# Patient Record
Sex: Female | Born: 1955
Health system: Southern US, Community
[De-identification: ages and names within clinical notes are randomized; demographics above are authoritative.]

## PROBLEM LIST (undated history)

## (undated) DIAGNOSIS — E039 Hypothyroidism, unspecified: Secondary | ICD-10-CM

## (undated) DIAGNOSIS — J45909 Unspecified asthma, uncomplicated: Secondary | ICD-10-CM

## (undated) DIAGNOSIS — K760 Fatty (change of) liver, not elsewhere classified: Secondary | ICD-10-CM

## (undated) DIAGNOSIS — K219 Gastro-esophageal reflux disease without esophagitis: Secondary | ICD-10-CM

## (undated) DIAGNOSIS — M722 Plantar fascial fibromatosis: Secondary | ICD-10-CM

## (undated) DIAGNOSIS — K649 Unspecified hemorrhoids: Secondary | ICD-10-CM

## (undated) DIAGNOSIS — R112 Nausea with vomiting, unspecified: Secondary | ICD-10-CM

## (undated) DIAGNOSIS — M797 Fibromyalgia: Secondary | ICD-10-CM

## (undated) DIAGNOSIS — R0683 Snoring: Secondary | ICD-10-CM

## (undated) DIAGNOSIS — I10 Essential (primary) hypertension: Secondary | ICD-10-CM

## (undated) DIAGNOSIS — G20A1 Parkinson's disease without dyskinesia, without mention of fluctuations: Secondary | ICD-10-CM

## (undated) DIAGNOSIS — M1990 Unspecified osteoarthritis, unspecified site: Secondary | ICD-10-CM

## (undated) DIAGNOSIS — Z794 Long term (current) use of insulin: Secondary | ICD-10-CM

## (undated) DIAGNOSIS — Z87898 Personal history of other specified conditions: Secondary | ICD-10-CM

## (undated) DIAGNOSIS — M4322 Fusion of spine, cervical region: Secondary | ICD-10-CM

## (undated) DIAGNOSIS — I2510 Atherosclerotic heart disease of native coronary artery without angina pectoris: Secondary | ICD-10-CM

## (undated) DIAGNOSIS — T466X5A Adverse effect of antihyperlipidemic and antiarteriosclerotic drugs, initial encounter: Secondary | ICD-10-CM

## (undated) DIAGNOSIS — M4306 Spondylolysis, lumbar region: Secondary | ICD-10-CM

## (undated) DIAGNOSIS — E669 Obesity, unspecified: Secondary | ICD-10-CM

## (undated) DIAGNOSIS — E119 Type 2 diabetes mellitus without complications: Secondary | ICD-10-CM

## (undated) DIAGNOSIS — R51 Headache: Secondary | ICD-10-CM

## (undated) DIAGNOSIS — I700 Atherosclerosis of aorta: Secondary | ICD-10-CM

## (undated) DIAGNOSIS — F32A Depression, unspecified: Secondary | ICD-10-CM

## (undated) DIAGNOSIS — Z8619 Personal history of other infectious and parasitic diseases: Secondary | ICD-10-CM

## (undated) DIAGNOSIS — R0600 Dyspnea, unspecified: Secondary | ICD-10-CM

## (undated) DIAGNOSIS — F40240 Claustrophobia: Secondary | ICD-10-CM

## (undated) DIAGNOSIS — R002 Palpitations: Secondary | ICD-10-CM

## (undated) DIAGNOSIS — E113599 Type 2 diabetes mellitus with proliferative diabetic retinopathy without macular edema, unspecified eye: Secondary | ICD-10-CM

## (undated) DIAGNOSIS — K259 Gastric ulcer, unspecified as acute or chronic, without hemorrhage or perforation: Secondary | ICD-10-CM

## (undated) DIAGNOSIS — I6789 Other cerebrovascular disease: Secondary | ICD-10-CM

## (undated) DIAGNOSIS — Z889 Allergy status to unspecified drugs, medicaments and biological substances status: Secondary | ICD-10-CM

## (undated) DIAGNOSIS — M4802 Spinal stenosis, cervical region: Secondary | ICD-10-CM

## (undated) DIAGNOSIS — M48061 Spinal stenosis, lumbar region without neurogenic claudication: Secondary | ICD-10-CM

## (undated) DIAGNOSIS — D649 Anemia, unspecified: Secondary | ICD-10-CM

## (undated) DIAGNOSIS — M546 Pain in thoracic spine: Secondary | ICD-10-CM

## (undated) DIAGNOSIS — G720 Drug-induced myopathy: Secondary | ICD-10-CM

## (undated) DIAGNOSIS — I499 Cardiac arrhythmia, unspecified: Secondary | ICD-10-CM

## (undated) DIAGNOSIS — Z8489 Family history of other specified conditions: Secondary | ICD-10-CM

## (undated) DIAGNOSIS — G250 Essential tremor: Secondary | ICD-10-CM

## (undated) DIAGNOSIS — Z9889 Other specified postprocedural states: Secondary | ICD-10-CM

## (undated) DIAGNOSIS — G43909 Migraine, unspecified, not intractable, without status migrainosus: Secondary | ICD-10-CM

## (undated) DIAGNOSIS — D72829 Elevated white blood cell count, unspecified: Secondary | ICD-10-CM

## (undated) DIAGNOSIS — M5412 Radiculopathy, cervical region: Secondary | ICD-10-CM

## (undated) DIAGNOSIS — F419 Anxiety disorder, unspecified: Secondary | ICD-10-CM

## (undated) DIAGNOSIS — T8859XA Other complications of anesthesia, initial encounter: Secondary | ICD-10-CM

## (undated) DIAGNOSIS — Z8709 Personal history of other diseases of the respiratory system: Secondary | ICD-10-CM

## (undated) DIAGNOSIS — R519 Headache, unspecified: Secondary | ICD-10-CM

## (undated) DIAGNOSIS — E785 Hyperlipidemia, unspecified: Secondary | ICD-10-CM

## (undated) DIAGNOSIS — F329 Major depressive disorder, single episode, unspecified: Secondary | ICD-10-CM

## (undated) HISTORY — PX: TONSILLECTOMY: SUR1361

## (undated) HISTORY — PX: CARPAL TUNNEL RELEASE: SHX101

## (undated) HISTORY — PX: SHOULDER ARTHROSCOPY: SHX128

## (undated) HISTORY — PX: LEG TENDON SURGERY: SHX1004

## (undated) HISTORY — PX: JOINT REPLACEMENT: SHX530

## (undated) HISTORY — PX: PLANTAR FASCIA SURGERY: SHX746

## (undated) HISTORY — PX: ALLOGRAFT APPLICATION: SHX6404

## (undated) HISTORY — PX: SHOULDER SURGERY: SHX246

## (undated) HISTORY — PX: TOTAL KNEE ARTHROPLASTY: SHX125

## (undated) HISTORY — DX: Essential (primary) hypertension: I10

## (undated) HISTORY — PX: NERVE SURGERY: SHX1016

## (undated) HISTORY — PX: BREAST BIOPSY: SHX20

## (undated) HISTORY — PX: OTHER SURGICAL HISTORY: SHX169

---

## 2004-09-07 ENCOUNTER — Emergency Department: Payer: Self-pay | Admitting: Emergency Medicine

## 2005-04-09 ENCOUNTER — Ambulatory Visit: Payer: Self-pay | Admitting: Unknown Physician Specialty

## 2005-04-16 ENCOUNTER — Ambulatory Visit: Payer: Self-pay | Admitting: Unknown Physician Specialty

## 2005-05-18 ENCOUNTER — Ambulatory Visit: Payer: Self-pay | Admitting: Obstetrics and Gynecology

## 2005-06-24 ENCOUNTER — Ambulatory Visit: Payer: Self-pay | Admitting: Unknown Physician Specialty

## 2005-07-12 ENCOUNTER — Ambulatory Visit: Payer: Self-pay

## 2005-07-29 ENCOUNTER — Ambulatory Visit: Payer: Self-pay

## 2006-08-16 ENCOUNTER — Ambulatory Visit: Payer: Self-pay | Admitting: Family Medicine

## 2006-08-17 ENCOUNTER — Ambulatory Visit: Payer: Self-pay | Admitting: Family Medicine

## 2006-10-27 ENCOUNTER — Ambulatory Visit: Payer: Self-pay | Admitting: Internal Medicine

## 2006-10-28 ENCOUNTER — Ambulatory Visit: Payer: Self-pay | Admitting: Internal Medicine

## 2006-11-27 ENCOUNTER — Ambulatory Visit: Payer: Self-pay | Admitting: Internal Medicine

## 2006-12-27 ENCOUNTER — Ambulatory Visit: Payer: Self-pay | Admitting: Internal Medicine

## 2007-01-20 ENCOUNTER — Ambulatory Visit: Payer: Self-pay | Admitting: Internal Medicine

## 2007-01-27 ENCOUNTER — Ambulatory Visit: Payer: Self-pay | Admitting: Internal Medicine

## 2007-02-01 ENCOUNTER — Ambulatory Visit: Payer: Self-pay | Admitting: Internal Medicine

## 2007-02-27 ENCOUNTER — Ambulatory Visit: Payer: Self-pay | Admitting: Internal Medicine

## 2007-08-22 ENCOUNTER — Ambulatory Visit: Payer: Self-pay | Admitting: Family Medicine

## 2007-11-28 ENCOUNTER — Ambulatory Visit: Payer: Self-pay

## 2007-12-27 ENCOUNTER — Ambulatory Visit: Payer: Self-pay | Admitting: Orthopaedic Surgery

## 2008-01-04 ENCOUNTER — Ambulatory Visit: Payer: Self-pay | Admitting: Orthopaedic Surgery

## 2008-06-28 HISTORY — PX: ACHILLES TENDON LENGTHENING: SUR826

## 2008-09-23 ENCOUNTER — Observation Stay: Payer: Self-pay | Admitting: Specialist

## 2008-09-26 ENCOUNTER — Ambulatory Visit: Payer: Self-pay | Admitting: Internal Medicine

## 2008-10-10 ENCOUNTER — Ambulatory Visit: Payer: Self-pay | Admitting: Family Medicine

## 2008-10-11 ENCOUNTER — Ambulatory Visit: Payer: Self-pay | Admitting: Internal Medicine

## 2008-10-26 ENCOUNTER — Ambulatory Visit: Payer: Self-pay | Admitting: Internal Medicine

## 2009-03-28 ENCOUNTER — Ambulatory Visit: Payer: Self-pay | Admitting: Internal Medicine

## 2009-04-23 ENCOUNTER — Ambulatory Visit: Payer: Self-pay | Admitting: Internal Medicine

## 2009-04-28 ENCOUNTER — Ambulatory Visit: Payer: Self-pay | Admitting: Internal Medicine

## 2009-09-26 ENCOUNTER — Ambulatory Visit: Payer: Self-pay | Admitting: Internal Medicine

## 2009-10-22 ENCOUNTER — Ambulatory Visit: Payer: Self-pay | Admitting: Internal Medicine

## 2009-10-26 ENCOUNTER — Ambulatory Visit: Payer: Self-pay | Admitting: Internal Medicine

## 2009-11-13 ENCOUNTER — Ambulatory Visit: Payer: Self-pay

## 2009-12-25 ENCOUNTER — Ambulatory Visit: Payer: Self-pay | Admitting: Otolaryngology

## 2010-01-09 ENCOUNTER — Ambulatory Visit: Payer: Self-pay | Admitting: Otolaryngology

## 2010-06-02 ENCOUNTER — Ambulatory Visit: Payer: Self-pay | Admitting: Podiatry

## 2010-08-24 ENCOUNTER — Ambulatory Visit: Payer: Self-pay | Admitting: Unknown Physician Specialty

## 2010-08-25 LAB — PATHOLOGY REPORT

## 2010-11-05 ENCOUNTER — Ambulatory Visit: Payer: Self-pay | Admitting: Podiatry

## 2011-01-25 ENCOUNTER — Ambulatory Visit: Payer: Self-pay | Admitting: Family Medicine

## 2011-09-30 ENCOUNTER — Ambulatory Visit: Payer: Self-pay | Admitting: Unknown Physician Specialty

## 2011-10-12 ENCOUNTER — Ambulatory Visit: Payer: Self-pay | Admitting: Anesthesiology

## 2011-10-12 LAB — BASIC METABOLIC PANEL
BUN: 20 mg/dL — ABNORMAL HIGH (ref 7–18)
Calcium, Total: 8.9 mg/dL (ref 8.5–10.1)
Co2: 28 mmol/L (ref 21–32)
EGFR (African American): >60
EGFR (Non-African Amer.): >60
Glucose: 109 mg/dL — ABNORMAL HIGH (ref 65–99)
Potassium: 3.8 mmol/L (ref 3.5–5.1)
Sodium: 137 mmol/L (ref 136–145)

## 2011-10-14 ENCOUNTER — Ambulatory Visit: Payer: Self-pay | Admitting: Orthopedic Surgery

## 2012-04-04 ENCOUNTER — Ambulatory Visit: Payer: Self-pay | Admitting: Internal Medicine

## 2012-05-09 ENCOUNTER — Ambulatory Visit: Payer: Self-pay | Admitting: Specialist

## 2012-06-06 ENCOUNTER — Ambulatory Visit: Payer: Self-pay | Admitting: Specialist

## 2012-08-14 ENCOUNTER — Ambulatory Visit: Payer: Self-pay | Admitting: Physical Medicine and Rehabilitation

## 2012-10-04 DIAGNOSIS — M542 Cervicalgia: Secondary | ICD-10-CM | POA: Insufficient documentation

## 2012-10-05 ENCOUNTER — Ambulatory Visit: Payer: Self-pay | Admitting: Family Medicine

## 2012-10-10 ENCOUNTER — Ambulatory Visit: Payer: Self-pay | Admitting: Internal Medicine

## 2012-10-10 LAB — CBC CANCER CENTER
Basophil #: 0.1 x10 3/mm (ref 0.0–0.1)
Comment - H1-Com3: NORMAL
HGB: 12 g/dL (ref 12.0–16.0)
Lymphocyte #: 5.4 x10 3/mm — ABNORMAL HIGH (ref 1.0–3.6)
Lymphocyte %: 34.9 %
MCH: 25.2 pg — ABNORMAL LOW (ref 26.0–34.0)
MCHC: 32.2 g/dL (ref 32.0–36.0)
Monocyte #: 1.2 x10 3/mm — ABNORMAL HIGH (ref 0.2–0.9)
Monocytes: 7 %
NRBC/100 WBC: 1 /100
Neutrophil %: 54.6 %
Other Cells Blood: 3 %
Platelet: 257 x10 3/mm (ref 150–440)
RBC: 4.78 10*6/uL (ref 3.80–5.20)
RDW: 16.5 % — ABNORMAL HIGH (ref 11.5–14.5)
Segmented Neutrophils: 54 %
Variant Lymphocyte: 4 %
WBC: 15.6 x10 3/mm — ABNORMAL HIGH (ref 3.6–11.0)

## 2012-10-10 LAB — CREATININE, SERUM
Creatinine: 0.74 mg/dL (ref 0.60–1.30)
EGFR (African American): >60

## 2012-10-10 LAB — HEPATIC FUNCTION PANEL A (ARMC)
Albumin: 3.6 g/dL (ref 3.4–5.0)
Bilirubin,Total: 0.3 mg/dL (ref 0.2–1.0)
SGPT (ALT): 32 U/L (ref 12–78)

## 2012-10-11 LAB — PROT IMMUNOELECTROPHORES(ARMC)

## 2012-10-26 ENCOUNTER — Ambulatory Visit: Payer: Self-pay | Admitting: Internal Medicine

## 2012-10-31 LAB — IRON AND TIBC
Iron Bind.Cap.(Total): 358 ug/dL (ref 250–450)
Iron Saturation: 16 %
Iron: 58 ug/dL (ref 50–170)

## 2012-11-14 ENCOUNTER — Ambulatory Visit: Payer: Self-pay | Admitting: Family Medicine

## 2012-11-21 DIAGNOSIS — M4802 Spinal stenosis, cervical region: Secondary | ICD-10-CM | POA: Insufficient documentation

## 2012-11-26 ENCOUNTER — Ambulatory Visit: Payer: Self-pay | Admitting: Internal Medicine

## 2012-11-29 HISTORY — PX: OTHER SURGICAL HISTORY: SHX169

## 2012-12-04 HISTORY — PX: OTHER SURGICAL HISTORY: SHX169

## 2013-01-30 ENCOUNTER — Ambulatory Visit: Payer: Self-pay | Admitting: Gastroenterology

## 2013-01-31 LAB — PATHOLOGY REPORT

## 2013-06-06 ENCOUNTER — Ambulatory Visit: Payer: Self-pay | Admitting: Orthopedic Surgery

## 2013-10-31 ENCOUNTER — Ambulatory Visit: Payer: Self-pay | Admitting: Orthopedic Surgery

## 2013-11-14 ENCOUNTER — Ambulatory Visit: Payer: Self-pay | Admitting: Orthopedic Surgery

## 2013-11-14 DIAGNOSIS — I10 Essential (primary) hypertension: Secondary | ICD-10-CM

## 2013-11-14 LAB — URINALYSIS, COMPLETE
BACTERIA: NONE SEEN
Bilirubin,UR: NEGATIVE
Blood: NEGATIVE
Glucose,UR: NEGATIVE mg/dL (ref 0–75)
Hyaline Cast: 12
Ketone: NEGATIVE
Leukocyte Esterase: NEGATIVE
Nitrite: NEGATIVE
PH: 5 (ref 4.5–8.0)
Protein: 30
Specific Gravity: 1.023 (ref 1.003–1.030)
Squamous Epithelial: 4
WBC UR: 6 /HPF (ref 0–5)

## 2013-11-14 LAB — MRSA PCR SCREENING

## 2013-11-14 LAB — CBC
HCT: 42.1 % (ref 35.0–47.0)
HGB: 13.6 g/dL (ref 12.0–16.0)
MCH: 27 pg (ref 26.0–34.0)
MCHC: 32.3 g/dL (ref 32.0–36.0)
MCV: 83 fL (ref 80–100)
Platelet: 303 10*3/uL (ref 150–440)
RBC: 5.05 10*6/uL (ref 3.80–5.20)
RDW: 14.8 % — ABNORMAL HIGH (ref 11.5–14.5)
WBC: 14.1 10*3/uL — AB (ref 3.6–11.0)

## 2013-11-14 LAB — BASIC METABOLIC PANEL
ANION GAP: 9 (ref 7–16)
BUN: 17 mg/dL (ref 7–18)
CALCIUM: 9.2 mg/dL (ref 8.5–10.1)
CHLORIDE: 97 mmol/L — AB (ref 98–107)
Co2: 30 mmol/L (ref 21–32)
Creatinine: 0.96 mg/dL (ref 0.60–1.30)
EGFR (African American): >60
Glucose: 218 mg/dL — ABNORMAL HIGH (ref 65–99)
Osmolality: 280 (ref 275–301)
Potassium: 3.4 mmol/L — ABNORMAL LOW (ref 3.5–5.1)
Sodium: 136 mmol/L (ref 136–145)

## 2013-11-14 LAB — SEDIMENTATION RATE: Erythrocyte Sed Rate: 38 mm/hr — ABNORMAL HIGH (ref 0–30)

## 2013-11-14 LAB — PROTIME-INR
INR: 1
PROTHROMBIN TIME: 12.7 s (ref 11.5–14.7)

## 2013-11-14 LAB — APTT: Activated PTT: 26.1 secs (ref 23.6–35.9)

## 2013-11-15 LAB — URINE CULTURE

## 2013-11-27 ENCOUNTER — Inpatient Hospital Stay: Payer: Self-pay | Admitting: Orthopedic Surgery

## 2013-11-27 HISTORY — PX: TOTAL KNEE ARTHROPLASTY: SHX125

## 2013-11-28 LAB — HEMOGLOBIN: HGB: 11.8 g/dL — ABNORMAL LOW (ref 12.0–16.0)

## 2013-11-28 LAB — BASIC METABOLIC PANEL
Anion Gap: 7 (ref 7–16)
BUN: 12 mg/dL (ref 7–18)
CO2: 30 mmol/L (ref 21–32)
CREATININE: 0.7 mg/dL (ref 0.60–1.30)
Calcium, Total: 7.7 mg/dL — ABNORMAL LOW (ref 8.5–10.1)
Chloride: 98 mmol/L (ref 98–107)
GLUCOSE: 166 mg/dL — AB (ref 65–99)
OSMOLALITY: 274 (ref 275–301)
Potassium: 3.6 mmol/L (ref 3.5–5.1)
Sodium: 135 mmol/L — ABNORMAL LOW (ref 136–145)

## 2013-11-28 LAB — PLATELET COUNT: PLATELETS: 257 10*3/uL (ref 150–440)

## 2013-11-29 LAB — PATHOLOGY REPORT

## 2013-12-06 DIAGNOSIS — Z96659 Presence of unspecified artificial knee joint: Secondary | ICD-10-CM | POA: Insufficient documentation

## 2014-05-07 DIAGNOSIS — M546 Pain in thoracic spine: Secondary | ICD-10-CM | POA: Insufficient documentation

## 2014-10-19 NOTE — Discharge Summary (Signed)
PATIENT NAMEKINZLEY, [name] MR#:  [id number] DATE OF BIRTH:  09-Dec-1955  DATE OF ADMISSION:  11/27/2013 DATE OF DISCHARGE: 11/30/2013   ADMITTING DIAGNOSIS: Right knee osteoarthritis.   DISCHARGE DIAGNOSIS: Right knee osteoarthritis.   PROCEDURE: Right total knee replacement.   ANESTHESIA: Spinal.   SURGEON: Laurene Footman, M.D.   ASSISTANT: Rachelle Hora, PA-C   ANESTHESIA: Spinal.   ESTIMATED BLOOD LOSS: 100 mL.  TOURNIQUET TIME: 69 minutes at 300 mmHg.  IMPLANTS: Medacta GMK total knee system, GMK sphere system with a right 3 sphere femur, right 2 fixed tibia insert tray, and a size 2 right 12 mm Flex insert, size 2 patella. All components cemented.   CONDITION: To recovery room, stable.   HISTORY: The patient is a 59 year old female, who has had years of right knee pain. She has failed nonoperative treatment options such as anti-inflammatory medications, pain medications, cortisone injections and Synvisc injections. The pain has progressed to the point where it interferes with her activities of daily living. Pain is severe mostly along the medial joint line. She describes it as a dull ache. She has pain with rest as well as with activity. The patient has agreed and consented to a right total knee replacement.   PHYSICAL EXAMINATION:  GENERAL: Well developed, obese female in no apparent distress. Normal affect. Presents with a cane.  HEART: Regular rate and rhythm.  LUNGS: Clear to auscultation bilaterally. No wheezes, rales, or rhonchi.  HEENT: Head normocephalic, atraumatic. Pupils equal, round, and reactive to light.  EXTREMITIES: Right knee: Examination of the right knee shows that the patient has mild swelling. No warmth, erythema or effusion. She has range of motion of 0 to 120 degrees. There is no laxity with valgus and varus stress testing. She is tender along the medial joint line and nontender along lateral joint line. She has 2+ dorsalis pedis pulse with 5 out of 5  strength with ankle plantar flexion, dorsiflexion, knee flexion and extension.   HOSPITAL COURSE: The patient was admitted to the hospital on November 27, 2013. She had surgery that same day and was brought to the orthopedic floor from the PACU in stable condition. On postop day 1, the patient had a small drop in her hemoglobin with an acute postop blood loss anemia with a hemoglobin of 11.8. Her vital signs were stable. She was having moderate pain. She had good progress with physical therapy and was able to ambulate 200 feet. On postop day 2, the patient's pain was moderate to severe. We did try to get her off of IV morphine as well as Dilaudid. We continued with oxycodone 5 mg 1 tab p.o. q.4 to 6 hours as needed for pain. We did add OxyContin extended release 10 mg 1 tab p.o. b.i.d. Adjustment in her pain medication did seem to give her adequate relief. Her pain was controlled throughout the day at night. She did very well with physical therapy on postop day 2. The patient was stable and ready for discharge to home health PT. On postop day 3, the patient met all goals for discharge to home with home health PT. She was doing well. Pain medications were continued. She does not have any nausea or vomiting. She was tolerating p.o. and she was ready for discharge.   DISCHARGE INSTRUCTIONS: The patient may gradually increase weight-bearing on the affected extremity. Elevate the affected foot or leg on 1 or 2 pillows with the foot higher than the knee. Thigh-high TED hose on both  legs and remove at bedtime, replace on arising the next morning. Elevate the heels off the bed. Use incentive spirometer every hour while awake and encourage cough and deep breathing. No concentrated sweets or sugar. Diabetic diet as tolerated. Continue using Polar Care unit maintaining temp between 40 and 50 degrees. Do not get the dressing or bandage wet or dirty. Call [facility] ortho if the dressing gets water under it. Leave the dressing on. Call  [facility] ortho if any of the following occur: Bright red bleeding from the incision wound, fever above 101.5 degrees, redness, swelling, or drainage at the incision site. Call [facility] ortho if you experience any increased leg pain, numbness or weakness in legs or bowel or bladder symptoms. Home physical therapist arranged for continuation of rehab program. Please call [place] ortho if therapist has not contacted you within 48 hours of your return home. Call [facility] ortho for a followup appointment. The patient needs be seen in 2 weeks with [facility] ortho, please call for an appointment. Discharging physician is Dr. Hessie Knows.   DISCHARGE MEDICATIONS: Please see discharge instructions for discharge medications.  ____________________________ T. Rachelle Hora, PA-C tcg:aw D: 11/30/2013 08:20:19 ET T: 11/30/2013 08:45:37 ET JOB#: [id number]  cc: T. Rachelle Hora, PA-C, <Dictator> Duanne Guess [place] ELECTRONICALLY SIGNED 12/22/2013 0:07

## 2014-10-19 NOTE — Op Note (Signed)
PATIENT NAMEEMMAGENE, [name] MR#:  [id number] DATE OF BIRTH:  [birth year]  DATE OF PROCEDURE:  11/27/2013  PREOPERATIVE DIAGNOSIS: Right knee osteoarthritis.   POSTOPERATIVE DIAGNOSIS: Right knee osteoarthritis.   PROCEDURE: Right total knee replacement.   ANESTHESIA: Spinal.   SURGEON: Hessie Knows, M.D.   ASSISTANT: Rachelle Hora, PA-C.   DESCRIPTION OF PROCEDURE: The patient was brought to the Operating Room and, after adequate anesthesia was obtained, the right leg was prepped and draped in the usual sterile fashion with a tourniquet applied to the upper thigh. After patient identification and timeout procedures were completed, the leg was exsanguinated with an Esmarch and the tourniquet raised to 300 mmHg. A midline skin incision was made, followed by a medial parapatellar arthrotomy. Inspection revealed moderate to advanced medial and patellofemoral arthritis, mild lateral compartment arthritis, anterior cruciate ligament intact, along with meniscus. After excising the fat pad and the anterior cruciate ligament, the proximal tibia was exposed to allow for application of the proximal tibia cutting guide. This cutting guide was applied, alignment checked and proximal tibia cut carried out, with the bone removed, along with the posterior horns of the menisci.   Next, the femur was approached in a similar fashion, removing some cartilage off the end of the femur laterally, applying the cutting block, cutting the distal femur, applying the distal femoral cutting guide, size 3, anterior, posterior and chamfer cuts made. A size 2 tibial template was applied, with rotation based on one of the initial pins from the tibial guide. This was pinned in place, proximal drilling carried out. A keel punch placed and trials then placed, with a 3 femur, 2 tibia millimeters. A 12 mm gave good stability.   At this point, the trials were removed and the trochlear cut made on the distal femur after drill holes has been  made for the distal femur. Patella was cut with freehand technique. After showing significant patellofemoral degenerative change, with some exposed bone. A 10 mm resection carried out and tibia sized to a size 2. The tourniquet was let down at this point. Hemostasis was checked with electrocautery. Injection of morphine and Sensorcaine, along with a dilute Exparel periarticularly was carried out. The wound was thoroughly irrigated and the tourniquet was raised. The bony surfaces were dried. The tibial component was cemented into place first, followed by the femoral component, 12 mm trial. Knee held in extension. The patellar button, size 2, clamped into place while the components cemented.   After the cement was set, the knee was thoroughly irrigated. The 12 mm insert appeared to give excellent stability, full extension, and was chosen as the final implant. The trial was removed and the knee again thoroughly irrigated. The final component was snapped into place with a set screw. There was excellent patellofemoral tracking without any need for lateral release. The medial arthrotomy was repaired using a heavy quill, 2-0 quill subcutaneously and skin staples. Xeroform, 4 x 4's, ABDs, Webril, Polar Care and Ace wrap applied. The patient was sent to the recovery room in stable condition.   ESTIMATED BLOOD LOSS: 100.   TOURNIQUET TIME: 69 minutes at 300 mmHg.   IMPLANTS: Medacta GMK total knee system, GMK Sphere system with a right 3 Sphere femur, right 2 fixed tibia insert tray and a size 2 right 12 mm flex insert, size 2 patella. All components cemented.   CONDITION: To recovery room, stable.   COMPLICATIONS: None.     ____________________________ Laurene Footman, MD mjm:cg D: 11/28/2013  00:49:02 ET T: 11/28/2013 01:30:53 ET JOB#: [id number]  cc: Laurene Footman, MD, <Dictator> Laurene Footman MD ELECTRONICALLY SIGNED 11/29/2013 7:24

## 2014-10-20 NOTE — Op Note (Signed)
PATIENT NAMEARLONE, [name] MR#:  [id number] DATE OF BIRTH:  18-Mar-1956  DATE OF PROCEDURE:  10/14/2011  PREOPERATIVE DIAGNOSIS: Right knee lateral meniscus tear and osteoarthritis.   POSTOPERATIVE DIAGNOSIS: Medial meniscus tear and osteoarthritis.   PROCEDURE: Arthroscopy, partial medial meniscectomy.   SURGEON: Laurene Footman, MD  ANESTHESIA: General.    DESCRIPTION OF PROCEDURE: Patient brought to the Operating Room and after adequate anesthesia was obtained, the right leg was placed in the arthroscopic legholder and tourniquet applied. After patient identification and timeout procedures were completed, an inferolateral portal was made and the arthroscope was introduced. Initial inspection revealed some synovial folds in suprapatellar pouch. No loose bodies. There is significant patellofemoral degenerative change. Coming around medially, an inferomedial portal was made and on probing there was extensive tears to the posterior horn of the medial meniscus with vertical and horizontal components and some of these could be displaced into the joint. There was extensive erosive changes to both femoral and tibial condyles with no normal articular cartilage. The anterior cruciate ligament was intact. The lateral compartment had degenerative changes but intact meniscus. The gutters were checked and there were no loose bodies. At this point, the medial meniscus was approached with the use of meniscal punch followed by ArthroCare wand and using a grasper to remove large meniscal fragments so that they would not be left. After thorough irrigation of the joint and removal of the meniscus back to a stable margin all instrumentation was withdrawn. The wounds were closed with simple interrupted 4-0 nylon. 20 mL of 0.5% Sensorcaine with epinephrine was infiltrated into the area of the portals. Xeroform, 4 x 4, Webril, and Ace wrap applied and the patient sent to recovery in stable condition.   ESTIMATED BLOOD LOSS:  Minimal.   COMPLICATIONS: None.     SPECIMEN: None.   ____________________________ Laurene Footman, MD mjm:cms D: 10/14/2011 20:48:07 ET T: 10/15/2011 08:29:38 ET JOB#: [id number]  cc: Laurene Footman, MD, <Dictator> Laurene Footman MD ELECTRONICALLY SIGNED 10/15/2011 12:20

## 2015-08-19 ENCOUNTER — Other Ambulatory Visit: Payer: Self-pay | Admitting: Family Medicine

## 2015-08-19 DIAGNOSIS — Z1231 Encounter for screening mammogram for malignant neoplasm of breast: Secondary | ICD-10-CM

## 2015-08-22 ENCOUNTER — Institutional Professional Consult (permissible substitution): Payer: Self-pay | Admitting: Internal Medicine

## 2015-09-02 ENCOUNTER — Ambulatory Visit
Admission: RE | Admit: 2015-09-02 | Discharge: 2015-09-02 | Disposition: A | Discharge status: Home / Self Care | Payer: BLUE CROSS/BLUE SHIELD | Source: Ambulatory Visit | Attending: Family Medicine | Admitting: Family Medicine

## 2015-09-02 DIAGNOSIS — Z1231 Encounter for screening mammogram for malignant neoplasm of breast: Secondary | ICD-10-CM | POA: Diagnosis not present

## 2015-09-30 ENCOUNTER — Encounter: Payer: Self-pay | Admitting: Pulmonary Disease

## 2015-09-30 ENCOUNTER — Ambulatory Visit (INDEPENDENT_AMBULATORY_CARE_PROVIDER_SITE_OTHER): Payer: BLUE CROSS/BLUE SHIELD | Admitting: Pulmonary Disease

## 2015-09-30 ENCOUNTER — Encounter (INDEPENDENT_AMBULATORY_CARE_PROVIDER_SITE_OTHER): Payer: Self-pay

## 2015-09-30 VITALS — BP 130/74 | HR 69 | Ht 64.0 in | Wt 236.6 lb

## 2015-09-30 DIAGNOSIS — R079 Chest pain, unspecified: Secondary | ICD-10-CM | POA: Diagnosis not present

## 2015-09-30 DIAGNOSIS — R0609 Other forms of dyspnea: Secondary | ICD-10-CM

## 2015-09-30 DIAGNOSIS — E669 Obesity, unspecified: Secondary | ICD-10-CM

## 2015-09-30 DIAGNOSIS — Z87891 Personal history of nicotine dependence: Secondary | ICD-10-CM

## 2015-09-30 MED ORDER — UMECLIDINIUM-VILANTEROL 62.5-25 MCG/INH IN AEPB: 1.0000 | INHALATION_SPRAY | Freq: Every day | RESPIRATORY_TRACT | Status: AC

## 2015-09-30 MED ORDER — UMECLIDINIUM-VILANTEROL 62.5-25 MCG/INH IN AEPB
1.0000 | INHALATION_SPRAY | Freq: Every day | RESPIRATORY_TRACT | Status: DC
Start: 2015-09-30 — End: 2015-10-30

## 2015-09-30 MED ORDER — UMECLIDINIUM-VILANTEROL 62.5-25 MCG/INH IN AEPB: 1.0000 | INHALATION_SPRAY | Freq: Every day | RESPIRATORY_TRACT | Status: DC

## 2015-09-30 NOTE — Patient Instructions (Signed)
Trial of Anoro inhaler - one inhalation daily Continue albuterol (Ventolin) as previously Follow up in 4-6 weeks with lung function tests and chest Xray

## 2015-10-02 NOTE — Progress Notes (Signed)
PULMONARY CONSULT NOTE  Requesting MD/Service: [place] Pink, MD Date of initial consultation: )4/)4/17 Reason for consultation:  Dyspnea  PT PROFILE: 60 y.o. F former smoker of approx 30 p-y (quit 2002) referred for evaluation of exertional dyspnea. 09/30/15: Initial eval. Trial of Anoro inhaler. CXR and PFTs ordered.  HPI:  19 F former smoker referred for evaluation of exertional dyspnea of several years' duration with constant chest pain which she characterizes as midsternal chest tightness. Her CP is positional and not related to exertion. She has undergone cardiac evaluation for these without a cardiac etiology rendered. She has been prescribed a diuretic with 9# wt loss and improvement in LE edema but no significant change in dyspnea. There is little day to day variation and little season to season variation in her symptoms. She has occasional NP cough. She denies CP, fever, purulent sputum, LE edema and calf tenderness. She has been prescribed an albuterol inhaler which she states "helps a lot". She has seen Dr Raul Del approx 5 yrs ago but does not wish to follow with him again.   Past Medical History  Diagnosis Date  . Hypertension         Allergies - improved after immunotherapy  Past Surgical History  Procedure Laterality Date  . Breast biopsy Bilateral 1970's  . Breast biopsy Left     stereotactic  . Cesarean section    . Total knee arthroplasty    . Shoulder surgery      MEDICATIONS: I have reviewed all medications and confirmed regimen as documented  Social History   Social History  . Marital Status: Married    Spouse Name: N/A  . Number of Children: N/A  . Years of Education: N/A   Occupational History  . Not on file.   Social History Main Topics  . Smoking status: Former Smoker -- 2.00 packs/day for 35 years    Quit date: 07/01/2000  . Smokeless tobacco: Never Used  . Alcohol Use: No  . Drug Use: No  . Sexual Activity: Not on file   Other Topics Concern   . Not on file   Social History Narrative    Family History  Problem Relation Age of Onset  . Breast cancer Mother 71    ROS: No fever, myalgias/arthralgias, unexplained weight loss or weight gain No new focal weakness or sensory deficits No otalgia, hearing loss, visual changes, nasal and sinus symptoms, mouth and throat problems No neck pain or adenopathy No abdominal pain, N/V/D, diarrhea, change in bowel pattern No dysuria, change in urinary pattern No LE edema or calf tenderness   Filed Vitals:   09/30/15 0907  BP: 130/74  Pulse: 69  Height: 5\' 4"  (1.626 m)  Weight: 236 lb 9.6 oz (107.321 kg)  SpO2: 95%     EXAM:  Gen: Obese, tremulous, no overt respiratory distress HEENT: NCAT, TMs and canals normal, sclera white, nares and nasal mucosa normal, oropharynx normal Neck: Supple without LAN, thyromegaly, JVD Lungs: breath sounds mildly diminished, percussion note normal throughout, No adventitious sounds Cardiovascular: Normal rate, reg rhythm, no murmurs noted Abdomen: Soft, nontender, normal BS Ext: without clubbing, cyanosis. Trace ankle edema Neuro: CNs grossly intact, motor and sensory intact, DTRs symmetric Skin: Limited exam, no lesions noted  DATA:   BMP Latest Ref Rng 11/28/2013 11/14/2013 10/10/2012  Glucose 65-99 mg/dL 166(H) 218(H) -  BUN 7-18 mg/dL 12 17 -  Creatinine 0.60-1.30 mg/dL 0.70 0.96 0.74  Sodium 136-145 mmol/L 135(L) 136 -  Potassium 3.5-5.1 mmol/L 3.6  3.4(L) -  Chloride 98-107 mmol/L 98 97(L) -  CO2 21-32 mmol/L 30 30 -  Calcium 8.5-10.1 mg/dL 7.7(L) 9.2 -    CBC Latest Ref Rng 11/28/2013 11/14/2013 10/10/2012  WBC 3.6-11.0 x10 3/mm 3 - 14.1(H) 15.6(H)  Hemoglobin 12.0-16.0 g/dL 11.8(L) 13.6 12.0  Hematocrit 35.0-47.0 % - 42.1 37.4  Platelets 150-440 x10 3/mm 3 257 303 257    CXR:  No recent films  IMPRESSION:     ICD-9-CM ICD-10-CM   1. DOE (dyspnea on exertion) 786.09 R06.09 Pulmonary function test     DG Chest 2 View  2. Chest  pain, unspecified chest pain type 786.50 R07.9 Pulmonary function test     DG Chest 2 View  3. Obesity 278.00 E66.9   4. Former smoker V15.82 Z87.891    Suspect COPD based on smoking history with reversible component based on response to albuterol MDI  PLAN:  Trial Anoro inhaler Cont PRN albuterol ROV 4-6 wks with PFTs, CXR   Merton Border, MD PCCM service Mobile [phone] Pager [phone] 10/02/2015

## 2015-10-29 ENCOUNTER — Ambulatory Visit (INDEPENDENT_AMBULATORY_CARE_PROVIDER_SITE_OTHER): Payer: BLUE CROSS/BLUE SHIELD | Admitting: *Deleted

## 2015-10-29 ENCOUNTER — Ambulatory Visit
Admission: RE | Admit: 2015-10-29 | Discharge: 2015-10-29 | Disposition: A | Discharge status: Home / Self Care | Payer: BLUE CROSS/BLUE SHIELD | Source: Ambulatory Visit | Attending: Pulmonary Disease | Admitting: Pulmonary Disease

## 2015-10-29 DIAGNOSIS — I517 Cardiomegaly: Secondary | ICD-10-CM | POA: Diagnosis not present

## 2015-10-29 DIAGNOSIS — R079 Chest pain, unspecified: Secondary | ICD-10-CM

## 2015-10-29 DIAGNOSIS — R0609 Other forms of dyspnea: Secondary | ICD-10-CM

## 2015-10-29 LAB — PULMONARY FUNCTION TEST
DL/VA % PRED: 89 %
DL/VA: 4.31 ml/min/mmHg/L
DLCO UNC % PRED: 176 %
DLCO unc: 42.93 ml/min/mmHg
FEF 25-75 PRE: 2.53 L/s
FEF 25-75 Post: 2.77 L/sec
FEF2575-%CHANGE-POST: 9 %
FEF2575-%PRED-POST: 121 %
FEF2575-%Pred-Pre: 111 %
FEV1-%CHANGE-POST: 2 %
FEV1-%Pred-Post: 66 %
FEV1-%Pred-Pre: 65 %
FEV1-POST: 1.68 L
FEV1-Pre: 1.64 L
FEV1FVC-%Change-Post: 1 %
FEV1FVC-%PRED-PRE: 113 %
FEV6-%Change-Post: 0 %
FEV6-%PRED-POST: 59 %
FEV6-%Pred-Pre: 58 %
FEV6-Post: 1.85 L
FEV6-Pre: 1.85 L
FEV6FVC-%Pred-Post: 104 %
FEV6FVC-%Pred-Pre: 104 %
FVC-%CHANGE-POST: 0 %
FVC-%PRED-POST: 56 %
FVC-%Pred-Pre: 56 %
FVC-PRE: 1.85 L
FVC-Post: 1.85 L
POST FEV1/FVC RATIO: 91 %
PRE FEV6/FVC RATIO: 100 %
Post FEV6/FVC ratio: 100 %
Pre FEV1/FVC ratio: 89 %
RV % PRED: 80 %
RV: 1.63 L
TLC % pred: 71 %
TLC: 3.62 L

## 2015-10-29 NOTE — Progress Notes (Signed)
PFT performed.

## 2015-10-30 ENCOUNTER — Ambulatory Visit (INDEPENDENT_AMBULATORY_CARE_PROVIDER_SITE_OTHER): Payer: BLUE CROSS/BLUE SHIELD | Admitting: Pulmonary Disease

## 2015-10-30 ENCOUNTER — Other Ambulatory Visit
Admission: RE | Admit: 2015-10-30 | Discharge: 2015-10-30 | Disposition: A | Discharge status: Home / Self Care | Payer: BLUE CROSS/BLUE SHIELD | Source: Ambulatory Visit | Attending: Pulmonary Disease | Admitting: Pulmonary Disease

## 2015-10-30 ENCOUNTER — Encounter: Payer: Self-pay | Admitting: Pulmonary Disease

## 2015-10-30 VITALS — BP 132/76 | HR 75 | Ht 64.0 in | Wt 238.0 lb

## 2015-10-30 DIAGNOSIS — R0600 Dyspnea, unspecified: Secondary | ICD-10-CM | POA: Diagnosis not present

## 2015-10-30 DIAGNOSIS — Z87891 Personal history of nicotine dependence: Secondary | ICD-10-CM

## 2015-10-30 DIAGNOSIS — E669 Obesity, unspecified: Secondary | ICD-10-CM

## 2015-10-30 DIAGNOSIS — R942 Abnormal results of pulmonary function studies: Secondary | ICD-10-CM

## 2015-10-30 DIAGNOSIS — K219 Gastro-esophageal reflux disease without esophagitis: Secondary | ICD-10-CM

## 2015-10-30 LAB — CBC
HEMATOCRIT: 38.8 % (ref 35.0–47.0)
Hemoglobin: 12.5 g/dL (ref 12.0–16.0)
MCH: 25.3 pg — ABNORMAL LOW (ref 26.0–34.0)
MCHC: 32.2 g/dL (ref 32.0–36.0)
MCV: 78.5 fL — AB (ref 80.0–100.0)
Platelets: 307 10*3/uL (ref 150–440)
RBC: 4.95 MIL/uL (ref 3.80–5.20)
RDW: 16 % — ABNORMAL HIGH (ref 11.5–14.5)
WBC: 13.4 10*3/uL — AB (ref 3.6–11.0)

## 2015-10-30 LAB — BRAIN NATRIURETIC PEPTIDE: B Natriuretic Peptide: 29 pg/mL (ref 0.0–100.0)

## 2015-11-01 NOTE — Progress Notes (Signed)
PULMONARY OFFICE FOLLOW UP NOTE  Requesting MD/Service: Maryland Pink, MD Date of initial consultation: )4/)4/17 Reason for consultation:  Dyspnea  PT PROFILE: 60 y.o. F former smoker of approx 30 p-y (quit 2002) referred for evaluation of exertional dyspnea. 09/30/15: Initial eval. Trial of Anoro inhaler. CXR and PFTs ordered. 10/30/15 Routine ROV: Anoro modestly beneficial. Still with mod-severe dyspnea on exertion. Complained of CP - nonexertional, positional, deemed likely due to GERD. CXR reviewed: cardiomegaly with interstitial prominence suggestive of CHF. PFTs revealed no obstruction, mild restriction, normal DLCO. PLAN: continue Anoro; check CBC, BNP; change PPI to dinner time; ROV 4-6 weeks   SUBJ: Routine re-eval. Continues to have moderate exertional dyspnea. Also complains of chest pain that is not related to exertion or dyspnea and is exacerbated by supine position.   Filed Vitals:   10/30/15 0856  BP: 132/76  Pulse: 75  Height: 5\' 4"  (1.626 m)  Weight: 238 lb (107.956 kg)  SpO2: 93%     EXAM:  Gen: Obese, no respiratory distress HEENT: NCAT, WNL Neck: Supple without LAN, thyromegaly, JVD Lungs: breath sounds mildly diminished, No adventitious sounds Cardiovascular: Normal rate, reg rhythm, no murmurs noted Abdomen: Soft, nontender, normal BS Ext: without clubbing, cyanosis. Trace ankle edema Neuro: grossly intact   DATA:  CXR:  Cardiomegaly, interstitial prominence PFTs: mild restriction, no obstruction, normal DLCO  IMPRESSION:     ICD-9-CM ICD-10-CM   1. Dyspnea 786.09 R06.00 CBC     B Nat Peptide     CANCELED: CBC     CANCELED: B Nat Peptide  2. Obesity 278.00 E66.9   3. Former smoker V15.82 Z87.891   4. Abnormal lung function test 794.2 R94.2 CANCELED: CBC     CANCELED: B Nat Peptide  5. Gastroesophageal reflux disease, esophagitis presence not specified 530.81 K21.9 CANCELED: CBC     CANCELED: B Nat Peptide  1) DOE out of proportion to objective  findings 2) Former smoker - no definite COPD by PFTs 3) Obesity 4) positional, nonexertional CP - suspect GERD  PLAN:  Cont Anoro inhaler Cont PRN albuterol Change PPI to dinner time Check CBC, BNP If BNP abnormal, consider repeat TTE (was normal in 11/16) ROV 4-6 wks   Merton Border, MD PCCM service Mobile [phone] Pager [phone] 11/01/2015

## 2015-12-02 ENCOUNTER — Ambulatory Visit (INDEPENDENT_AMBULATORY_CARE_PROVIDER_SITE_OTHER): Payer: BLUE CROSS/BLUE SHIELD | Admitting: Pulmonary Disease

## 2015-12-02 ENCOUNTER — Other Ambulatory Visit
Admission: RE | Admit: 2015-12-02 | Discharge: 2015-12-02 | Disposition: A | Discharge status: Home / Self Care | Payer: BLUE CROSS/BLUE SHIELD | Source: Ambulatory Visit | Attending: Pulmonary Disease | Admitting: Pulmonary Disease

## 2015-12-02 ENCOUNTER — Encounter: Payer: Self-pay | Admitting: Pulmonary Disease

## 2015-12-02 VITALS — BP 136/72 | HR 75 | Ht 64.0 in | Wt 239.0 lb

## 2015-12-02 DIAGNOSIS — E669 Obesity, unspecified: Secondary | ICD-10-CM | POA: Diagnosis not present

## 2015-12-02 DIAGNOSIS — R0600 Dyspnea, unspecified: Secondary | ICD-10-CM | POA: Diagnosis not present

## 2015-12-02 DIAGNOSIS — E039 Hypothyroidism, unspecified: Secondary | ICD-10-CM

## 2015-12-02 NOTE — Patient Instructions (Signed)
We talked about dietary changes to help with weight loss  No sugar at all  Very limited simple carbohydrates - bread, rice, past, potatoes  Focus on single ingredient foods  Shop in the periphery of the grocery store  Follow up in 3 months - target weight 225 pounds

## 2015-12-02 NOTE — Progress Notes (Signed)
PULMONARY OFFICE FOLLOW UP NOTE  Requesting MD/Service: Maryland Pink, MD Date of initial consultation: )4/)4/17 Reason for consultation:  Dyspnea  PT PROFILE: 60 y.o. F former smoker of approx 30 p-y (quit 2002) referred for evaluation of exertional dyspnea. 09/30/15: Initial eval. Trial of Anoro inhaler. CXR and PFTs ordered. 10/30/15: Routine ROV: Anoro modestly beneficial. Still with mod-severe dyspnea on exertion. Complained of CP - nonexertional, positional, deemed likely due to GERD. CXR reviewed: cardiomegaly with interstitial prominence suggestive of CHF. PFTs revealed no obstruction, mild restriction, normal DLCO. PLAN: continue Anoro; check CBC (normal), BNP (normal); change PPI to dinner time; ROV 4-6 weeks  12/02/15: Improved DOE on Anoro. No new complaints. Discussed weight loss strategies. ROV 3 months with target weight of 225#  SUBJ: Routine re-eval. Dyspnea is improved on Anoro. No new complaints. Still with some CP when supine but this is improved with changing PPI to dinner time  Filed Vitals:   12/02/15 0953  BP: 136/72  Pulse: 75  Height: 5\' 4"  (1.626 m)  Weight: 239 lb (108.41 kg)  SpO2: 96%     EXAM:  Gen: Obese, no respiratory distress HEENT: NCAT, WNL Neck: Supple without LAN, thyromegaly, JVD Lungs: breath sounds mildly diminished, No adventitious sounds Cardiovascular: Normal rate, reg rhythm, no murmurs noted Abdomen: Soft, nontender, normal BS Ext: without clubbing, cyanosis. Trace ankle edema Neuro: grossly intact   DATA:  CBC, BNP normal CXR:  No new film PFTs: no new study  IMPRESSION:   No diagnosis found.1) DOE out of proportion to objective findings 2) Former smoker - no definite COPD by PFTs 3) Obesity 4) positional, nonexertional CP - suspect GERD  PLAN:  Cont Anoro inhaler Cont PRN albuterol Cont PPI daily We talked about dietary changes to help with weight loss  No sugar at all  Very limited simple carbohydrates - bread,  rice, past, potatoes  Focus on single ingredient foods  Shop in the periphery of the grocery store ROV 3 months with traget weight of 225#   Merton Border, MD PCCM service Mobile [phone] Pager [phone] 12/02/2015

## 2015-12-03 ENCOUNTER — Other Ambulatory Visit
Admission: RE | Admit: 2015-12-03 | Discharge: 2015-12-03 | Disposition: A | Discharge status: Home / Self Care | Payer: BLUE CROSS/BLUE SHIELD | Source: Ambulatory Visit | Attending: Pulmonary Disease | Admitting: Pulmonary Disease

## 2015-12-03 DIAGNOSIS — E039 Hypothyroidism, unspecified: Secondary | ICD-10-CM | POA: Insufficient documentation

## 2015-12-03 LAB — TSH: TSH: 2.914 u[IU]/mL (ref 0.350–4.500)

## 2016-02-23 DIAGNOSIS — Z981 Arthrodesis status: Secondary | ICD-10-CM | POA: Insufficient documentation

## 2016-02-23 DIAGNOSIS — M5412 Radiculopathy, cervical region: Secondary | ICD-10-CM | POA: Insufficient documentation

## 2016-03-04 ENCOUNTER — Encounter: Payer: Self-pay | Admitting: Pulmonary Disease

## 2016-03-04 ENCOUNTER — Ambulatory Visit (INDEPENDENT_AMBULATORY_CARE_PROVIDER_SITE_OTHER): Payer: BLUE CROSS/BLUE SHIELD | Admitting: Pulmonary Disease

## 2016-03-04 VITALS — BP 140/88 | HR 84 | Ht 64.0 in | Wt 244.0 lb

## 2016-03-04 DIAGNOSIS — E669 Obesity, unspecified: Secondary | ICD-10-CM | POA: Diagnosis not present

## 2016-03-04 DIAGNOSIS — Z87891 Personal history of nicotine dependence: Secondary | ICD-10-CM | POA: Diagnosis not present

## 2016-03-04 DIAGNOSIS — R942 Abnormal results of pulmonary function studies: Secondary | ICD-10-CM | POA: Diagnosis not present

## 2016-03-04 DIAGNOSIS — R0600 Dyspnea, unspecified: Secondary | ICD-10-CM

## 2016-03-04 NOTE — Progress Notes (Signed)
PULMONARY OFFICE FOLLOW UP NOTE  Requesting MD/Service: Maryland Pink, MD Date of initial consultation: )4/)4/17 Reason for consultation:  Dyspnea  PT PROFILE: 60 y.o. F former smoker of approx 30 p-y (quit 2002) referred for evaluation of exertional dyspnea. 09/30/15: Initial eval. Trial of Anoro inhaler. CXR and PFTs ordered. 10/30/15: Routine ROV: Anoro modestly beneficial. Still with mod-severe dyspnea on exertion. Complained of CP - nonexertional, positional, deemed likely due to GERD. CXR reviewed: cardiomegaly with interstitial prominence suggestive of CHF. PFTs revealed no obstruction, mild restriction, normal DLCO. PLAN: continue Anoro; check CBC (normal), BNP (normal); change PPI to dinner time; ROV 4-6 weeks  12/02/15: Improved DOE on Anoro. No new complaints. Discussed weight loss strategies. ROV 3 months with target weight of 225# 03/04/16 No new respiratory complaints. Weight is up 19#. Discussed weight loss strategies in detail  SUBJ: Routine re-eval. Dyspnea remains well-controlled on Anoro. No new complaints. Expresses frustration with weight gain of 19# since last visit  Vitals:   03/04/16 1335  BP: 140/88  Pulse: 84  SpO2: 93%  Weight: 244 lb (110.7 kg)  Height: 5\' 4"  (1.626 m)     EXAM:  Gen: Obese, no respiratory distress HEENT: NCAT, WNL Neck: Supple without LAN, thyromegaly, JVD Lungs: breath sounds full, No wheezes Cardiovascular: Normal rate, reg rhythm, no murmurs noted Abdomen: Soft, nontender, normal BS Ext: without clubbing, cyanosis. Trace ankle edema Neuro: grossly intact   DATA:  CXR:  No new film PFTs: no new study  IMPRESSION:     ICD-9-CM ICD-10-CM   1. Dyspnea 786.09 R06.00   2. Obesity 278.00 E66.9   3. Former smoker V15.82 Z87.891   4. Restrictive pattern present on pulmonary function testing 794.2 R94.2    Restriction is due to obesity. Dyspnea is partially or fully explained by obesity although she does believe there has been  some benefit from Anoro  PLAN:  Cont Anoro and PRN albuterol We discussed in detail weight loss strategies emphasizing a low carb approach, avoiding all sugars and simple carbs, eating real "single ingredient" foods. I encouraged that these changes should allow her to remain satisfied and meet all nutritional needs.  ROV 3 months with target weight of 225#   Merton Border, MD PCCM service Mobile [phone] Pager [phone] 03/06/2016

## 2016-03-22 ENCOUNTER — Ambulatory Visit (INDEPENDENT_AMBULATORY_CARE_PROVIDER_SITE_OTHER): Payer: BLUE CROSS/BLUE SHIELD | Admitting: Pulmonary Disease

## 2016-03-22 DIAGNOSIS — Z23 Encounter for immunization: Secondary | ICD-10-CM

## 2016-04-10 DIAGNOSIS — M5416 Radiculopathy, lumbar region: Secondary | ICD-10-CM | POA: Insufficient documentation

## 2016-04-13 ENCOUNTER — Encounter: Payer: Self-pay | Admitting: *Deleted

## 2016-04-14 ENCOUNTER — Ambulatory Visit: Payer: BLUE CROSS/BLUE SHIELD | Admitting: Anesthesiology

## 2016-04-14 ENCOUNTER — Ambulatory Visit
Admission: RE | Admit: 2016-04-14 | Discharge: 2016-04-14 | Disposition: A | Discharge status: Home / Self Care | Payer: BLUE CROSS/BLUE SHIELD | Source: Ambulatory Visit | Attending: Unknown Physician Specialty | Admitting: Unknown Physician Specialty

## 2016-04-14 ENCOUNTER — Encounter: Payer: Self-pay | Admitting: *Deleted

## 2016-04-14 ENCOUNTER — Encounter
Admission: RE | Disposition: A | Discharge status: Home / Self Care | Payer: Self-pay | Source: Ambulatory Visit | Attending: Unknown Physician Specialty

## 2016-04-14 DIAGNOSIS — Z79899 Other long term (current) drug therapy: Secondary | ICD-10-CM | POA: Insufficient documentation

## 2016-04-14 DIAGNOSIS — J449 Chronic obstructive pulmonary disease, unspecified: Secondary | ICD-10-CM | POA: Insufficient documentation

## 2016-04-14 DIAGNOSIS — F329 Major depressive disorder, single episode, unspecified: Secondary | ICD-10-CM | POA: Diagnosis not present

## 2016-04-14 DIAGNOSIS — Z6841 Body Mass Index (BMI) 40.0 and over, adult: Secondary | ICD-10-CM | POA: Insufficient documentation

## 2016-04-14 DIAGNOSIS — D175 Benign lipomatous neoplasm of intra-abdominal organs: Secondary | ICD-10-CM | POA: Diagnosis not present

## 2016-04-14 DIAGNOSIS — K219 Gastro-esophageal reflux disease without esophagitis: Secondary | ICD-10-CM | POA: Insufficient documentation

## 2016-04-14 DIAGNOSIS — E039 Hypothyroidism, unspecified: Secondary | ICD-10-CM | POA: Insufficient documentation

## 2016-04-14 DIAGNOSIS — I10 Essential (primary) hypertension: Secondary | ICD-10-CM | POA: Insufficient documentation

## 2016-04-14 DIAGNOSIS — D128 Benign neoplasm of rectum: Secondary | ICD-10-CM | POA: Diagnosis not present

## 2016-04-14 DIAGNOSIS — Z1211 Encounter for screening for malignant neoplasm of colon: Secondary | ICD-10-CM | POA: Diagnosis not present

## 2016-04-14 DIAGNOSIS — Z7984 Long term (current) use of oral hypoglycemic drugs: Secondary | ICD-10-CM | POA: Insufficient documentation

## 2016-04-14 DIAGNOSIS — R1310 Dysphagia, unspecified: Secondary | ICD-10-CM | POA: Insufficient documentation

## 2016-04-14 DIAGNOSIS — Z87891 Personal history of nicotine dependence: Secondary | ICD-10-CM | POA: Insufficient documentation

## 2016-04-14 DIAGNOSIS — K640 First degree hemorrhoids: Secondary | ICD-10-CM | POA: Diagnosis not present

## 2016-04-14 DIAGNOSIS — K319 Disease of stomach and duodenum, unspecified: Secondary | ICD-10-CM | POA: Insufficient documentation

## 2016-04-14 DIAGNOSIS — Z86010 Personal history of colonic polyps: Secondary | ICD-10-CM | POA: Diagnosis not present

## 2016-04-14 DIAGNOSIS — E785 Hyperlipidemia, unspecified: Secondary | ICD-10-CM | POA: Insufficient documentation

## 2016-04-14 DIAGNOSIS — Z96659 Presence of unspecified artificial knee joint: Secondary | ICD-10-CM | POA: Diagnosis not present

## 2016-04-14 HISTORY — DX: Depression, unspecified: F32.A

## 2016-04-14 HISTORY — DX: Personal history of other specified conditions: Z87.898

## 2016-04-14 HISTORY — DX: Gastro-esophageal reflux disease without esophagitis: K21.9

## 2016-04-14 HISTORY — DX: Unspecified hemorrhoids: K64.9

## 2016-04-14 HISTORY — DX: Hyperlipidemia, unspecified: E78.5

## 2016-04-14 HISTORY — DX: Unspecified asthma, uncomplicated: J45.909

## 2016-04-14 HISTORY — DX: Essential tremor: G25.0

## 2016-04-14 HISTORY — DX: Anemia, unspecified: D64.9

## 2016-04-14 HISTORY — DX: Cardiac arrhythmia, unspecified: I49.9

## 2016-04-14 HISTORY — DX: Unspecified osteoarthritis, unspecified site: M19.90

## 2016-04-14 HISTORY — DX: Gastric ulcer, unspecified as acute or chronic, without hemorrhage or perforation: K25.9

## 2016-04-14 HISTORY — DX: Allergy status to unspecified drugs, medicaments and biological substances: Z88.9

## 2016-04-14 HISTORY — DX: Fibromyalgia: M79.7

## 2016-04-14 HISTORY — DX: Nausea with vomiting, unspecified: R11.2

## 2016-04-14 HISTORY — DX: Nausea with vomiting, unspecified: Z98.890

## 2016-04-14 HISTORY — DX: Personal history of other diseases of the respiratory system: Z87.09

## 2016-04-14 HISTORY — DX: Claustrophobia: F40.240

## 2016-04-14 HISTORY — DX: Hypothyroidism, unspecified: E03.9

## 2016-04-14 HISTORY — DX: Personal history of other infectious and parasitic diseases: Z86.19

## 2016-04-14 HISTORY — DX: Other specified postprocedural states: Z98.890

## 2016-04-14 HISTORY — PX: ESOPHAGOGASTRODUODENOSCOPY (EGD) WITH PROPOFOL: SHX5813

## 2016-04-14 HISTORY — DX: Elevated white blood cell count, unspecified: D72.829

## 2016-04-14 HISTORY — DX: Anxiety disorder, unspecified: F41.9

## 2016-04-14 HISTORY — DX: Headache, unspecified: R51.9

## 2016-04-14 HISTORY — DX: Headache: R51

## 2016-04-14 HISTORY — PX: COLONOSCOPY WITH PROPOFOL: SHX5780

## 2016-04-14 HISTORY — DX: Major depressive disorder, single episode, unspecified: F32.9

## 2016-04-14 LAB — GLUCOSE, CAPILLARY: Glucose-Capillary: 128 mg/dL — ABNORMAL HIGH (ref 65–99)

## 2016-04-14 SURGERY — COLONOSCOPY WITH PROPOFOL
Anesthesia: General

## 2016-04-14 MED ORDER — EPHEDRINE SULFATE 50 MG/ML IJ SOLN
INTRAMUSCULAR | Status: DC | PRN
  Administered 2016-04-14 (×3): 10 mg via INTRAVENOUS

## 2016-04-14 MED ORDER — MIDAZOLAM HCL 2 MG/2ML IJ SOLN
INTRAMUSCULAR | Status: DC | PRN
  Administered 2016-04-14: 1 mg via INTRAVENOUS

## 2016-04-14 MED ORDER — SODIUM CHLORIDE 0.9 % IV SOLN: INTRAVENOUS | Status: DC

## 2016-04-14 MED ORDER — PROPOFOL 500 MG/50ML IV EMUL
INTRAVENOUS | Status: DC | PRN
  Administered 2016-04-14: 140 ug/kg/min via INTRAVENOUS

## 2016-04-14 MED ORDER — FENTANYL CITRATE (PF) 100 MCG/2ML IJ SOLN
INTRAMUSCULAR | Status: DC | PRN
  Administered 2016-04-14: 50 ug via INTRAVENOUS

## 2016-04-14 MED ORDER — SODIUM CHLORIDE 0.9 % IV SOLN
INTRAVENOUS | Status: DC
  Administered 2016-04-14: 1000 mL via INTRAVENOUS

## 2016-04-14 MED ORDER — LIDOCAINE HCL (CARDIAC) 20 MG/ML IV SOLN
INTRAVENOUS | Status: DC | PRN
  Administered 2016-04-14: 30 mg via INTRAVENOUS

## 2016-04-14 MED ORDER — PIPERACILLIN-TAZOBACTAM 3.375 G IVPB 30 MIN
3.3750 g | Freq: Once | INTRAVENOUS | Status: AC
  Administered 2016-04-14: 3.375 g via INTRAVENOUS
  Filled 2016-04-14: qty 50

## 2016-04-14 NOTE — Anesthesia Preprocedure Evaluation (Addendum)
Anesthesia Evaluation  Patient identified by MRN, date of birth, ID band Patient awake    Reviewed: Allergy & Precautions, NPO status , Patient's Chart, lab work & pertinent test results  History of Anesthesia Complications (+) PONV  Airway Mallampati: II       Dental  (+) Teeth Intact   Pulmonary asthma , COPD, former smoker,     + decreased breath sounds      Cardiovascular Exercise Tolerance: Good hypertension, Pt. on medications  Rhythm:Regular Rate:Normal     Neuro/Psych  Headaches, Anxiety Depression    GI/Hepatic Neg liver ROS, PUD, GERD  Medicated,  Endo/Other  Hypothyroidism Morbid obesity  Renal/GU negative Renal ROS     Musculoskeletal   Abdominal (+) + obese,   Peds negative pediatric ROS (+)  Hematology  (+) anemia ,   Anesthesia Other Findings   Reproductive/Obstetrics                            Anesthesia Physical Anesthesia Plan  ASA: II  Anesthesia Plan: General   Post-op Pain Management:    Induction: Intravenous  Airway Management Planned: Natural Airway and Nasal Cannula  Additional Equipment:   Intra-op Plan:   Post-operative Plan:   Informed Consent: I have reviewed the patients History and Physical, chart, labs and discussed the procedure including the risks, benefits and alternatives for the proposed anesthesia with the patient or authorized representative who has indicated his/her understanding and acceptance.     Plan Discussed with: CRNA  Anesthesia Plan Comments:         Anesthesia Quick Evaluation

## 2016-04-14 NOTE — Op Note (Signed)
[name] Gastroenterology Patient Name: [name] Procedure Date: 04/14/2016 8:14 AM MRN: [id number] Account #: [id number] Date of Birth: [birth year] Admit Type: Outpatient Age: 60 Room: [facility] ENDO ROOM 4 Gender: Female Note Status: Finalized Procedure:            Colonoscopy Indications:          High risk colon cancer surveillance: Personal history                        of colonic polyps Providers:            Manya Silvas, MD Referring MD:         Irven Easterly. Kary Kos, MD (Referring MD) Medicines:            Propofol per Anesthesia Complications:        No immediate complications. Procedure:            Pre-Anesthesia Assessment:                       - After reviewing the risks and benefits, the patient                        was deemed in satisfactory condition to undergo the                        procedure.                       After obtaining informed consent, the colonoscope was                        passed under direct vision. Throughout the procedure,                        the patient's blood pressure, pulse, and oxygen                        saturations were monitored continuously. The                        Colonoscope was introduced through the anus and                        advanced to the the cecum, identified by appendiceal                        orifice and ileocecal valve. The colonoscopy was                        performed without difficulty. The patient tolerated the                        procedure well. The quality of the bowel preparation                        was good. Findings:      A 14 mm polyp was found in the distal rectum. The polyp was sessile. The       polyp was removed with a hot snare. Resection and retrieval were       complete. Tiny remnants removed with cold forceps.  Internal hemorrhoids were found during endoscopy. The hemorrhoids were       small and Grade I (internal hemorrhoids that do not prolapse).  There was a large lipoma, 30 mm in diameter, in the descending colon.      There was a large lipoma, 20 mm in diameter, in the transverse colon. Impression:           - One 14 mm polyp in the rectum, removed with a hot                        snare. Resected and retrieved.                       - Internal hemorrhoids.                       - Large lipoma in the descending colon.                       - Large lipoma in the transverse colon. Recommendation:       - Await pathology results. Need repeat flex sig in 4-6                        months. Manya Silvas, MD 04/14/2016 9:17:19 AM This report has been signed electronically. Number of Addenda: 0 Note Initiated On: 04/14/2016 8:14 AM Scope Withdrawal Time: 0 hours 21 minutes 37 seconds  Total Procedure Duration: 0 hours 25 minutes 6 seconds       [facility]

## 2016-04-14 NOTE — Anesthesia Postprocedure Evaluation (Signed)
Anesthesia Post Note  Patient: BRANAE WEISCHEDEL  Procedure(s) Performed: Procedure(s) (LRB): COLONOSCOPY WITH PROPOFOL (N/A) ESOPHAGOGASTRODUODENOSCOPY (EGD) WITH PROPOFOL (N/A)  Patient location during evaluation: PACU Anesthesia Type: General Level of consciousness: awake Pain management: pain level controlled Vital Signs Assessment: post-procedure vital signs reviewed and stable Cardiovascular status: stable Anesthetic complications: no    Last Vitals:  Vitals:   04/14/16 0750 04/14/16 0915  BP: (!) 159/76 95/66  Pulse:  65  Resp:  20  Temp:  (!) 35.7 C    Last Pain:  Vitals:   04/14/16 0915  TempSrc: Tympanic                 VAN STAVEREN,Jerilyn Gillaspie

## 2016-04-14 NOTE — Op Note (Signed)
[facility] Gastroenterology Patient Name: [name] Procedure Date: 04/14/2016 8:16 AM MRN: [id number] Account #: [id number] Date of Birth: December 01, 1955 Admit Type: Outpatient Age: 60 Room: [facility] ENDO ROOM 4 Gender: Female Note Status: Finalized Procedure:            Upper GI endoscopy Indications:          Dysphagia Providers:            Manya Silvas, MD Referring MD:         Irven Easterly. Kary Kos, MD (Referring MD) Medicines:            Propofol per Anesthesia Complications:        No immediate complications. Procedure:            Pre-Anesthesia Assessment:                       - After reviewing the risks and benefits, the patient                        was deemed in satisfactory condition to undergo the                        procedure.                       After obtaining informed consent, the endoscope was                        passed under direct vision. Throughout the procedure,                        the patient's blood pressure, pulse, and oxygen                        saturations were monitored continuously. The Endoscope                        was introduced through the mouth, and advanced to the                        second part of duodenum. The upper GI endoscopy was                        accomplished without difficulty. The patient tolerated                        the procedure well. Findings:      Circumferential salmon-colored mucosa was present at 15 cm. The maximum       longitudinal extent of these esophageal mucosal changes was 2 cm in       length. The remainder of the esophagus was normal. At the end of the       procedure a guide wire was passed and a 69F Savary dilator was passed       without difficulty.      Diffuse granular mucosa was found in the gastric body. Biopsies were       taken with a cold forceps for histology. Biopsies were taken with a cold       forceps for Helicobacter pylori testing.      Patchy mildly erythematous  mucosa without bleeding was found in the  gastric antrum. Biopsy done.      The examined duodenum was normal. Impression:           - Salmon-colored mucosa.                       - Granular gastric mucosa. Biopsied.                       - Erythematous mucosa in the antrum.                       - Normal examined duodenum. Recommendation:       - Await pathology results.                       - Perform a colonoscopy as previously scheduled. Manya Silvas, MD 04/14/2016 8:43:35 AM This report has been signed electronically. Number of Addenda: 0 Note Initiated On: 04/14/2016 8:16 AM      [facility]

## 2016-04-14 NOTE — Anesthesia Procedure Notes (Signed)
Performed by: COOK-MARTIN, Salma Walrond Pre-anesthesia Checklist: Patient identified, Emergency Drugs available, Suction available, Patient being monitored and Timeout performed Patient Re-evaluated:Patient Re-evaluated prior to inductionOxygen Delivery Method: Nasal cannula Preoxygenation: Pre-oxygenation with 100% oxygen Intubation Type: IV induction Airway Equipment and Method: Bite block Placement Confirmation: CO2 detector and positive ETCO2     

## 2016-04-14 NOTE — Transfer of Care (Signed)
Immediate Anesthesia Transfer of Care Note  Patient: [name]  Procedure(s) Performed: Procedure(s): COLONOSCOPY WITH PROPOFOL (N/A) ESOPHAGOGASTRODUODENOSCOPY (EGD) WITH PROPOFOL (N/A)  Patient Location: PACU  Anesthesia Type:General  Level of Consciousness: awake and sedated  Airway & Oxygen Therapy: Patient Spontanous Breathing and Patient connected to nasal cannula oxygen  Post-op Assessment: Report given to RN and Post -op Vital signs reviewed and stable  Post vital signs: Reviewed and stable  Last Vitals:  Vitals:   04/14/16 0747 04/14/16 0750  BP:  (!) 159/76  Pulse: 72   Resp: 20   Temp: 36.7 C     Last Pain:  Vitals:   04/14/16 0747  TempSrc: Tympanic         Complications: No apparent anesthesia complications

## 2016-04-14 NOTE — H&P (Signed)
Primary Care Physician:  Maryland Pink, MD Primary Gastroenterologist:  Dr. Vira Agar  Pre-Procedure History & Physical: HPI:  [name] is a 60 y.o. female is here for an endoscopy and colonoscopy.   Past Medical History:  Diagnosis Date  . Anemia   . Anxiety   . Arthritis   . Asthma   . Benign essential tremor   . Claustrophobia   . Depression   . Dysrhythmia   . Fibromyalgia   . Gastric ulcer   . GERD (gastroesophageal reflux disease)   . Headache   . Hemorrhoids   . History of chicken pox   . History of motion sickness   . History of pleurisy   . History of seasonal allergies   . Hyperlipidemia   . Hypertension   . Hypothyroidism   . Leukocytosis   . PONV (postoperative nausea and vomiting)     Past Surgical History:  Procedure Laterality Date  . BREAST BIOPSY Bilateral 1970's  . BREAST BIOPSY Left    stereotactic  . CESAREAN SECTION    . LEG TENDON SURGERY    . SHOULDER SURGERY    . TONSILLECTOMY    . TOTAL KNEE ARTHROPLASTY      Prior to Admission medications   Medication Sig Start Date End Date Taking? Authorizing Provider  lisinopril (PRINIVIL,ZESTRIL) 20 MG tablet Take by mouth. 04/01/15  Yes Historical Provider, MD  sertraline (ZOLOFT) 100 MG tablet Take 100 mg by mouth daily.   Yes Historical Provider, MD  vitamin B-12 (CYANOCOBALAMIN) 1000 MCG tablet Take 1,000 mcg by mouth daily.   Yes Historical Provider, MD  acetaminophen (TYLENOL) 500 MG tablet Take by mouth.    Historical Provider, MD  albuterol (VENTOLIN HFA) 108 (90 Base) MCG/ACT inhaler  05/01/14   Historical Provider, MD  diltiazem (CARDIZEM CD) 180 MG 24 hr capsule  09/24/15   Historical Provider, MD  fenofibrate 160 MG tablet Take by mouth. 02/04/15 02/04/16  Historical Provider, MD  glimepiride (AMARYL) 1 MG tablet  02/24/15   Historical Provider, MD  glucose blood (ONE TOUCH ULTRA TEST) test strip  07/29/15   Historical Provider, MD  levothyroxine (SYNTHROID, LEVOTHROID) 50 MCG tablet  11/25/15    Historical Provider, MD  pantoprazole (PROTONIX) 40 MG tablet Take by mouth. 04/01/15   Historical Provider, MD  umeclidinium-vilanterol (ANORO ELLIPTA) 62.5-25 MCG/INH AEPB Inhale 1 puff into the lungs daily. 09/30/15   Wilhelmina Mcardle, MD  Vitamin D, Ergocalciferol, (DRISDOL) 50000 units CAPS capsule  01/02/15   Historical Provider, MD    Allergies as of 04/01/2016 - Review Complete 03/04/2016  Allergen Reaction Noted  . Cat hair extract Other (See Comments) 09/30/2015  . Oxycodone Other (See Comments) 09/30/2015    Family History  Problem Relation Age of Onset  . Breast cancer Mother 77    Social History   Social History  . Marital status: Married    Spouse name: N/A  . Number of children: N/A  . Years of education: N/A   Occupational History  . Not on file.   Social History Main Topics  . Smoking status: Former Smoker    Packs/day: 2.00    Years: 35.00    Quit date: 07/01/2000  . Smokeless tobacco: Never Used  . Alcohol use No  . Drug use: No  . Sexual activity: Not on file   Other Topics Concern  . Not on file   Social History Narrative  . No narrative on file    Review  of Systems: See HPI, otherwise negative ROS  Physical Exam: BP (!) 159/76   Pulse 72   Temp 98 F (36.7 C) (Tympanic)   Resp 20   Ht 5\' 2"  (1.575 m)   Wt 108.9 kg (240 lb)   SpO2 96%   BMI 43.90 kg/m  General:   Alert,  pleasant and cooperative in NAD Head:  Normocephalic and atraumatic. Neck:  Supple; no masses or thyromegaly. Lungs:  Clear throughout to auscultation.    Heart:  Regular rate and rhythm. Abdomen:  Soft, nontender and nondistended. Normal bowel sounds, without guarding, and without rebound.   Neurologic:  Alert and  oriented x4;  grossly normal neurologically.  Impression/Plan: [name] is here for an endoscopy and colonoscopy to be performed for dysphagia and PH colon polyps.  Risks, benefits, limitations, and alternatives regarding  endoscopy and colonoscopy  have been reviewed with the patient.  Questions have been answered.  All parties agreeable.   Gaylyn Cheers, MD  04/14/2016, 8:11 AM

## 2016-04-16 LAB — SURGICAL PATHOLOGY

## 2016-04-17 ENCOUNTER — Encounter: Payer: Self-pay | Admitting: Unknown Physician Specialty

## 2016-04-27 DIAGNOSIS — G8929 Other chronic pain: Secondary | ICD-10-CM | POA: Insufficient documentation

## 2016-04-27 DIAGNOSIS — M5442 Lumbago with sciatica, left side: Secondary | ICD-10-CM

## 2016-04-27 DIAGNOSIS — M461 Sacroiliitis, not elsewhere classified: Secondary | ICD-10-CM | POA: Insufficient documentation

## 2016-04-27 DIAGNOSIS — M5441 Lumbago with sciatica, right side: Secondary | ICD-10-CM

## 2016-06-26 ENCOUNTER — Emergency Department
Admission: EM | Admit: 2016-06-26 | Discharge: 2016-06-26 | Disposition: A | Discharge status: Home / Self Care | Payer: BLUE CROSS/BLUE SHIELD | Attending: Emergency Medicine | Admitting: Emergency Medicine

## 2016-06-26 ENCOUNTER — Emergency Department: Payer: BLUE CROSS/BLUE SHIELD

## 2016-06-26 DIAGNOSIS — Z79899 Other long term (current) drug therapy: Secondary | ICD-10-CM | POA: Diagnosis not present

## 2016-06-26 DIAGNOSIS — J45909 Unspecified asthma, uncomplicated: Secondary | ICD-10-CM | POA: Diagnosis not present

## 2016-06-26 DIAGNOSIS — R000 Tachycardia, unspecified: Secondary | ICD-10-CM | POA: Diagnosis present

## 2016-06-26 DIAGNOSIS — Z5321 Procedure and treatment not carried out due to patient leaving prior to being seen by health care provider: Secondary | ICD-10-CM | POA: Insufficient documentation

## 2016-06-26 DIAGNOSIS — E039 Hypothyroidism, unspecified: Secondary | ICD-10-CM | POA: Diagnosis not present

## 2016-06-26 DIAGNOSIS — I10 Essential (primary) hypertension: Secondary | ICD-10-CM | POA: Insufficient documentation

## 2016-06-26 LAB — CBC
HEMATOCRIT: 40.3 % (ref 35.0–47.0)
Hemoglobin: 13 g/dL (ref 12.0–16.0)
MCH: 25.6 pg — AB (ref 26.0–34.0)
MCHC: 32.2 g/dL (ref 32.0–36.0)
MCV: 79.5 fL — AB (ref 80.0–100.0)
Platelets: 285 10*3/uL (ref 150–440)
RBC: 5.08 MIL/uL (ref 3.80–5.20)
RDW: 16.1 % — ABNORMAL HIGH (ref 11.5–14.5)
WBC: 16 10*3/uL — AB (ref 3.6–11.0)

## 2016-06-26 LAB — BASIC METABOLIC PANEL
Anion gap: 8 (ref 5–15)
BUN: 18 mg/dL (ref 6–20)
CHLORIDE: 104 mmol/L (ref 101–111)
CO2: 27 mmol/L (ref 22–32)
CREATININE: 0.84 mg/dL (ref 0.44–1.00)
Calcium: 9.1 mg/dL (ref 8.9–10.3)
GFR calc non Af Amer: >60 mL/min (ref >60)
Glucose, Bld: 164 mg/dL — ABNORMAL HIGH (ref 65–99)
POTASSIUM: 3.9 mmol/L (ref 3.5–5.1)
SODIUM: 139 mmol/L (ref 135–145)

## 2016-06-26 LAB — TROPONIN I: Troponin I: <0.03 ng/mL (ref <0.03)

## 2016-06-26 NOTE — ED Notes (Signed)
Attempted to talk patient into staying to be seen however she says she has a doctor's appointment on Wednesday and will keep that appointment

## 2016-06-26 NOTE — ED Triage Notes (Signed)
Pt states she has felt "like my blood pressure is going up and down and my heart feels like it's beating out of my chest sometimes." pt states she was checking pulse and blood pressure today because she felt palpitations. Pt states at times her heart rate was 140 and then would decrease to 70. Pt states blood pressure at home was 189/116. Skin pwd, pt denies chest pain.

## 2016-07-12 ENCOUNTER — Telehealth: Payer: Self-pay | Admitting: Urology

## 2016-07-12 ENCOUNTER — Inpatient Hospital Stay
Admission: EM | Admit: 2016-07-12 | Discharge: 2016-07-13 | DRG: 872 | Disposition: A | Discharge status: Home / Self Care | Payer: BLUE CROSS/BLUE SHIELD | Attending: Internal Medicine | Admitting: Internal Medicine

## 2016-07-12 ENCOUNTER — Emergency Department: Payer: BLUE CROSS/BLUE SHIELD

## 2016-07-12 ENCOUNTER — Encounter: Payer: Self-pay | Admitting: Radiology

## 2016-07-12 ENCOUNTER — Inpatient Hospital Stay: Payer: BLUE CROSS/BLUE SHIELD

## 2016-07-12 DIAGNOSIS — I10 Essential (primary) hypertension: Secondary | ICD-10-CM | POA: Diagnosis present

## 2016-07-12 DIAGNOSIS — Z96659 Presence of unspecified artificial knee joint: Secondary | ICD-10-CM | POA: Diagnosis present

## 2016-07-12 DIAGNOSIS — R7303 Prediabetes: Secondary | ICD-10-CM | POA: Diagnosis present

## 2016-07-12 DIAGNOSIS — M5416 Radiculopathy, lumbar region: Secondary | ICD-10-CM | POA: Diagnosis present

## 2016-07-12 DIAGNOSIS — F329 Major depressive disorder, single episode, unspecified: Secondary | ICD-10-CM | POA: Diagnosis present

## 2016-07-12 DIAGNOSIS — M797 Fibromyalgia: Secondary | ICD-10-CM | POA: Diagnosis present

## 2016-07-12 DIAGNOSIS — R10A0 Flank pain, unspecified side: Secondary | ICD-10-CM

## 2016-07-12 DIAGNOSIS — K219 Gastro-esophageal reflux disease without esophagitis: Secondary | ICD-10-CM | POA: Diagnosis present

## 2016-07-12 DIAGNOSIS — R109 Unspecified abdominal pain: Secondary | ICD-10-CM

## 2016-07-12 DIAGNOSIS — J45909 Unspecified asthma, uncomplicated: Secondary | ICD-10-CM | POA: Diagnosis present

## 2016-07-12 DIAGNOSIS — E785 Hyperlipidemia, unspecified: Secondary | ICD-10-CM | POA: Diagnosis present

## 2016-07-12 DIAGNOSIS — Z79899 Other long term (current) drug therapy: Secondary | ICD-10-CM

## 2016-07-12 DIAGNOSIS — N2889 Other specified disorders of kidney and ureter: Secondary | ICD-10-CM

## 2016-07-12 DIAGNOSIS — E039 Hypothyroidism, unspecified: Secondary | ICD-10-CM | POA: Diagnosis present

## 2016-07-12 DIAGNOSIS — Z7984 Long term (current) use of oral hypoglycemic drugs: Secondary | ICD-10-CM | POA: Diagnosis not present

## 2016-07-12 DIAGNOSIS — A419 Sepsis, unspecified organism: Secondary | ICD-10-CM | POA: Diagnosis present

## 2016-07-12 DIAGNOSIS — N289 Disorder of kidney and ureter, unspecified: Secondary | ICD-10-CM | POA: Diagnosis present

## 2016-07-12 DIAGNOSIS — E119 Type 2 diabetes mellitus without complications: Secondary | ICD-10-CM | POA: Diagnosis present

## 2016-07-12 DIAGNOSIS — R0602 Shortness of breath: Secondary | ICD-10-CM | POA: Diagnosis not present

## 2016-07-12 DIAGNOSIS — Z87891 Personal history of nicotine dependence: Secondary | ICD-10-CM

## 2016-07-12 DIAGNOSIS — J181 Lobar pneumonia, unspecified organism: Secondary | ICD-10-CM | POA: Diagnosis not present

## 2016-07-12 DIAGNOSIS — J189 Pneumonia, unspecified organism: Secondary | ICD-10-CM

## 2016-07-12 DIAGNOSIS — B349 Viral infection, unspecified: Secondary | ICD-10-CM | POA: Diagnosis present

## 2016-07-12 LAB — TSH: TSH: 3.194 u[IU]/mL (ref 0.350–4.500)

## 2016-07-12 LAB — CBC WITH DIFFERENTIAL/PLATELET
BASOS ABS: 0.1 10*3/uL (ref 0–0.1)
Basophils Relative: 1 %
EOS PCT: 0 %
Eosinophils Absolute: 0 10*3/uL (ref 0–0.7)
HEMATOCRIT: 39.6 % (ref 35.0–47.0)
Hemoglobin: 13.1 g/dL (ref 12.0–16.0)
LYMPHS ABS: 3 10*3/uL (ref 1.0–3.6)
LYMPHS PCT: 14 %
MCH: 26 pg (ref 26.0–34.0)
MCHC: 33.1 g/dL (ref 32.0–36.0)
MCV: 78.6 fL — AB (ref 80.0–100.0)
MONO ABS: 1.5 10*3/uL — AB (ref 0.2–0.9)
Monocytes Relative: 7 %
NEUTROS ABS: 16.9 10*3/uL — AB (ref 1.4–6.5)
Neutrophils Relative %: 78 %
PLATELETS: 293 10*3/uL (ref 150–440)
RBC: 5.05 MIL/uL (ref 3.80–5.20)
RDW: 15.9 % — AB (ref 11.5–14.5)
WBC: 21.5 10*3/uL — ABNORMAL HIGH (ref 3.6–11.0)

## 2016-07-12 LAB — URINALYSIS, ROUTINE W REFLEX MICROSCOPIC
Bilirubin Urine: NEGATIVE
GLUCOSE, UA: NEGATIVE mg/dL
Hgb urine dipstick: NEGATIVE
KETONES UR: NEGATIVE mg/dL
Nitrite: NEGATIVE
PROTEIN: NEGATIVE mg/dL
Specific Gravity, Urine: 1.018 (ref 1.005–1.030)
pH: 5 (ref 5.0–8.0)

## 2016-07-12 LAB — COMPREHENSIVE METABOLIC PANEL
ALBUMIN: 4 g/dL (ref 3.5–5.0)
ALT: 13 U/L — ABNORMAL LOW (ref 14–54)
ANION GAP: 8 (ref 5–15)
AST: 18 U/L (ref 15–41)
Alkaline Phosphatase: 79 U/L (ref 38–126)
BUN: 22 mg/dL — AB (ref 6–20)
CHLORIDE: 101 mmol/L (ref 101–111)
CO2: 29 mmol/L (ref 22–32)
Calcium: 9.1 mg/dL (ref 8.9–10.3)
Creatinine, Ser: 0.86 mg/dL (ref 0.44–1.00)
GFR calc Af Amer: >60 mL/min (ref >60)
GFR calc non Af Amer: >60 mL/min (ref >60)
GLUCOSE: 162 mg/dL — AB (ref 65–99)
POTASSIUM: 3.8 mmol/L (ref 3.5–5.1)
SODIUM: 138 mmol/L (ref 135–145)
TOTAL PROTEIN: 8.1 g/dL (ref 6.5–8.1)
Total Bilirubin: 0.5 mg/dL (ref 0.3–1.2)

## 2016-07-12 LAB — GLUCOSE, CAPILLARY
Glucose-Capillary: 169 mg/dL — ABNORMAL HIGH (ref 65–99)
Glucose-Capillary: 166 mg/dL — ABNORMAL HIGH (ref 65–99)
Glucose-Capillary: 172 mg/dL — ABNORMAL HIGH (ref 65–99)
Glucose-Capillary: 167 mg/dL — ABNORMAL HIGH (ref 65–99)

## 2016-07-12 LAB — PROCALCITONIN: Procalcitonin: <0.1 ng/mL

## 2016-07-12 LAB — LACTIC ACID, PLASMA: Lactic Acid, Venous: 1.7 mmol/L (ref 0.5–1.9)

## 2016-07-12 MED ORDER — MORPHINE SULFATE (PF) 2 MG/ML IV SOLN
INTRAVENOUS | Status: AC
  Administered 2016-07-12: 2 mg via INTRAVENOUS
  Filled 2016-07-12: qty 1

## 2016-07-12 MED ORDER — DILTIAZEM HCL ER COATED BEADS 180 MG PO CP24
180.0000 mg | ORAL_CAPSULE | Freq: Every day | ORAL | Status: DC
  Administered 2016-07-12: 180 mg via ORAL
  Filled 2016-07-12 (×2): qty 1

## 2016-07-12 MED ORDER — VITAMIN D (ERGOCALCIFEROL) 1.25 MG (50000 UNIT) PO CAPS
50000.0000 [IU] | ORAL_CAPSULE | ORAL | Status: DC
  Administered 2016-07-12: 10:00:00 50000 [IU] via ORAL
  Filled 2016-07-12: qty 1

## 2016-07-12 MED ORDER — MORPHINE SULFATE (PF) 4 MG/ML IV SOLN
4.0000 mg | Freq: Once | INTRAVENOUS | Status: AC
  Administered 2016-07-12: 4 mg via INTRAVENOUS

## 2016-07-12 MED ORDER — INSULIN ASPART 100 UNIT/ML ~~LOC~~ SOLN
0.0000 [IU] | Freq: Three times a day (TID) | SUBCUTANEOUS | Status: DC
Start: 2016-07-12 — End: 2016-07-13
  Administered 2016-07-12 (×3): 3 [IU] via SUBCUTANEOUS
  Administered 2016-07-13: 2 [IU] via SUBCUTANEOUS
  Filled 2016-07-12 (×3): qty 3
  Filled 2016-07-12: qty 2

## 2016-07-12 MED ORDER — INSULIN ASPART 100 UNIT/ML ~~LOC~~ SOLN: 0.0000 [IU] | Freq: Every day | SUBCUTANEOUS | Status: DC

## 2016-07-12 MED ORDER — ONDANSETRON HCL 4 MG/2ML IJ SOLN: 4.0000 mg | Freq: Four times a day (QID) | INTRAMUSCULAR | Status: DC | PRN

## 2016-07-12 MED ORDER — MORPHINE SULFATE (PF) 2 MG/ML IV SOLN
2.0000 mg | INTRAVENOUS | Status: DC | PRN
  Administered 2016-07-12: 2 mg via INTRAVENOUS
  Filled 2016-07-12: qty 1

## 2016-07-12 MED ORDER — ACETAMINOPHEN 325 MG PO TABS: 650.0000 mg | ORAL_TABLET | Freq: Four times a day (QID) | ORAL | Status: DC | PRN

## 2016-07-12 MED ORDER — FENOFIBRATE 160 MG PO TABS
160.0000 mg | ORAL_TABLET | Freq: Every day | ORAL | Status: DC
  Administered 2016-07-12: 160 mg via ORAL
  Filled 2016-07-12: qty 1

## 2016-07-12 MED ORDER — LISINOPRIL 20 MG PO TABS
20.0000 mg | ORAL_TABLET | Freq: Every day | ORAL | Status: DC
  Filled 2016-07-12: qty 1

## 2016-07-12 MED ORDER — HYDROCODONE-ACETAMINOPHEN 7.5-325 MG PO TABS
1.0000 | ORAL_TABLET | Freq: Four times a day (QID) | ORAL | Status: DC | PRN
  Administered 2016-07-12 (×2): 1 via ORAL
  Filled 2016-07-12 (×2): qty 1

## 2016-07-12 MED ORDER — SODIUM CHLORIDE 0.9 % IV SOLN
INTRAVENOUS | Status: DC
  Administered 2016-07-12 (×2): via INTRAVENOUS

## 2016-07-12 MED ORDER — GADOBENATE DIMEGLUMINE 529 MG/ML IV SOLN
20.0000 mL | Freq: Once | INTRAVENOUS | Status: AC | PRN
  Administered 2016-07-12: 13:00:00 20 mL via INTRAVENOUS

## 2016-07-12 MED ORDER — MORPHINE SULFATE (PF) 4 MG/ML IV SOLN
INTRAVENOUS | Status: AC
  Filled 2016-07-12: qty 1

## 2016-07-12 MED ORDER — VANCOMYCIN HCL IN DEXTROSE 1-5 GM/200ML-% IV SOLN
1000.0000 mg | Freq: Once | INTRAVENOUS | Status: AC
  Administered 2016-07-12: 10:00:00 1000 mg via INTRAVENOUS
  Filled 2016-07-12: qty 200

## 2016-07-12 MED ORDER — DOCUSATE SODIUM 100 MG PO CAPS
100.0000 mg | ORAL_CAPSULE | Freq: Two times a day (BID) | ORAL | Status: DC
  Filled 2016-07-12 (×2): qty 1

## 2016-07-12 MED ORDER — ENOXAPARIN SODIUM 40 MG/0.4ML ~~LOC~~ SOLN
40.0000 mg | Freq: Two times a day (BID) | SUBCUTANEOUS | Status: DC
  Administered 2016-07-12: 40 mg via SUBCUTANEOUS
  Filled 2016-07-12: qty 0.4

## 2016-07-12 MED ORDER — LEVOTHYROXINE SODIUM 50 MCG PO TABS
50.0000 ug | ORAL_TABLET | Freq: Every day | ORAL | Status: DC
  Administered 2016-07-12 – 2016-07-13 (×2): 50 ug via ORAL
  Filled 2016-07-12 (×2): qty 1

## 2016-07-12 MED ORDER — ONDANSETRON HCL 4 MG/2ML IJ SOLN
INTRAMUSCULAR | Status: AC
  Administered 2016-07-12: 4 mg via INTRAVENOUS
  Filled 2016-07-12: qty 2

## 2016-07-12 MED ORDER — ACETAMINOPHEN 650 MG RE SUPP: 650.0000 mg | Freq: Four times a day (QID) | RECTAL | Status: DC | PRN

## 2016-07-12 MED ORDER — ONDANSETRON HCL 4 MG PO TABS
4.0000 mg | ORAL_TABLET | Freq: Four times a day (QID) | ORAL | Status: DC | PRN
Start: 2016-07-12 — End: 2016-07-13

## 2016-07-12 MED ORDER — PANTOPRAZOLE SODIUM 40 MG PO TBEC
40.0000 mg | DELAYED_RELEASE_TABLET | Freq: Every day | ORAL | Status: DC
  Administered 2016-07-12: 40 mg via ORAL
  Filled 2016-07-12: qty 1

## 2016-07-12 MED ORDER — SODIUM CHLORIDE 0.9% FLUSH
3.0000 mL | Freq: Two times a day (BID) | INTRAVENOUS | Status: DC
  Administered 2016-07-12 (×2): 3 mL via INTRAVENOUS

## 2016-07-12 MED ORDER — ONDANSETRON HCL 4 MG/2ML IJ SOLN
4.0000 mg | Freq: Once | INTRAMUSCULAR | Status: AC
  Administered 2016-07-12: 4 mg via INTRAVENOUS

## 2016-07-12 MED ORDER — ACETAMINOPHEN 500 MG PO TABS
1000.0000 mg | ORAL_TABLET | Freq: Once | ORAL | Status: AC
  Administered 2016-07-12: 1000 mg via ORAL
  Filled 2016-07-12: qty 2

## 2016-07-12 MED ORDER — ONDANSETRON HCL 4 MG/2ML IJ SOLN
4.0000 mg | Freq: Once | INTRAMUSCULAR | Status: AC
  Administered 2016-07-12: 4 mg via INTRAVENOUS
  Filled 2016-07-12: qty 2

## 2016-07-12 MED ORDER — SODIUM CHLORIDE 0.9 % IV SOLN
Freq: Once | INTRAVENOUS | Status: AC
  Administered 2016-07-12: 02:00:00 via INTRAVENOUS

## 2016-07-12 MED ORDER — MORPHINE SULFATE (PF) 2 MG/ML IV SOLN
2.0000 mg | Freq: Once | INTRAVENOUS | Status: AC
  Administered 2016-07-12: 2 mg via INTRAVENOUS

## 2016-07-12 MED ORDER — ENOXAPARIN SODIUM 40 MG/0.4ML ~~LOC~~ SOLN: 40.0000 mg | SUBCUTANEOUS | Status: DC

## 2016-07-12 MED ORDER — CYANOCOBALAMIN 1000 MCG/ML IJ SOLN: 1000.0000 ug | INTRAMUSCULAR | Status: DC

## 2016-07-12 MED ORDER — LEVOFLOXACIN IN D5W 750 MG/150ML IV SOLN
750.0000 mg | Freq: Once | INTRAVENOUS | Status: AC
  Administered 2016-07-12: 750 mg via INTRAVENOUS
  Filled 2016-07-12: qty 150

## 2016-07-12 MED ORDER — SERTRALINE HCL 50 MG PO TABS
100.0000 mg | ORAL_TABLET | Freq: Every day | ORAL | Status: DC
  Administered 2016-07-12: 100 mg via ORAL
  Filled 2016-07-12: qty 2

## 2016-07-12 MED ORDER — MORPHINE SULFATE (PF) 4 MG/ML IV SOLN
4.0000 mg | Freq: Once | INTRAVENOUS | Status: AC
  Administered 2016-07-12: 4 mg via INTRAVENOUS
  Filled 2016-07-12: qty 1

## 2016-07-12 MED ORDER — PIPERACILLIN-TAZOBACTAM 3.375 G IVPB
3.3750 g | Freq: Three times a day (TID) | INTRAVENOUS | Status: DC
  Administered 2016-07-12 – 2016-07-13 (×3): 3.375 g via INTRAVENOUS
  Filled 2016-07-12 (×3): qty 50

## 2016-07-12 MED ORDER — IOPAMIDOL (ISOVUE-370) INJECTION 76%
100.0000 mL | Freq: Once | INTRAVENOUS | Status: AC | PRN
  Administered 2016-07-12: 100 mL via INTRAVENOUS

## 2016-07-12 NOTE — ED Triage Notes (Signed)
EMS pt to triage with complaint of right side flank pain. Pt reports pain started around 930pm when she was getting ready for bed. Pt reports when the pain started she developed shortness of breath. Pt is febrile and tachyphenic at triage.

## 2016-07-12 NOTE — Telephone Encounter (Signed)
Please arrange for outpatient follow up in 1 year with renal MRI prior.  She was seen as an inpatient consult today. MRI order placed.  Hollice Espy, MD

## 2016-07-12 NOTE — Progress Notes (Signed)
Anticoagulation monitoring(Lovenox):  60yo  female ordered Lovenox 40 mg Q24h  Filed Weights   07/12/16 0154  Weight: 240 lb (108.9 kg)   BMI 42   Lab Results  Component Value Date   CREATININE 0.86 07/12/2016   CREATININE 0.84 06/26/2016   CREATININE 0.70 11/28/2013   Estimated Creatinine Clearance: 80.3 mL/min (by C-G formula based on SCr of 0.86 mg/dL). Hemoglobin & Hematocrit     Component Value Date/Time   HGB 13.1 07/12/2016 0231   HGB 11.8 (L) 11/28/2013 0638   HCT 39.6 07/12/2016 0231   HCT 42.1 11/14/2013 0918     Per Protocol for Patient with estCrcl > 30 ml/min and BMI > 40, will transition to Lovenox 40 mg Q12h.

## 2016-07-12 NOTE — H&P (Signed)
[name] is an 61 y.o. female.   Chief Complaint: Shortness of breath HPI: The patient with past medical history of hypothyroidism and hyperlipidemia presents emergency department complaining of shortness of breath. The patient states that she has felt more fatigued and short of breath after developing a cough today. She initially began to feel bad the day before when she developed a migraine and later general malaise. She felt chills yesterday but was unable to take her temperature. Today she feels very hot. Upon arrival to the emergency department she was febrile to 101.8F. Chest x-ray showed right lower lobe opacity as well as possible lingular opacity. The patient was started on Levaquin prior to the emergency department staff calling the hospitalist service for admission.   Past Medical History:  Diagnosis Date  . Anemia   . Anxiety   . Arthritis   . Asthma   . Benign essential tremor   . Claustrophobia   . Depression   . Dysrhythmia   . Fibromyalgia   . Gastric ulcer   . GERD (gastroesophageal reflux disease)   . Headache   . Hemorrhoids   . History of chicken pox   . History of motion sickness   . History of pleurisy   . History of seasonal allergies   . Hyperlipidemia   . Hypertension   . Hypothyroidism   . Leukocytosis   . PONV (postoperative nausea and vomiting)     Past Surgical History:  Procedure Laterality Date  . BREAST BIOPSY Bilateral 1970's  . BREAST BIOPSY Left    stereotactic  . CESAREAN SECTION    . COLONOSCOPY WITH PROPOFOL N/A 04/14/2016   Procedure: COLONOSCOPY WITH PROPOFOL;  Surgeon: Manya Silvas, MD;  Location: [facility] ENDOSCOPY;  Service: Endoscopy;  Laterality: N/A;  . ESOPHAGOGASTRODUODENOSCOPY (EGD) WITH PROPOFOL N/A 04/14/2016   Procedure: ESOPHAGOGASTRODUODENOSCOPY (EGD) WITH PROPOFOL;  Surgeon: Manya Silvas, MD;  Location: [facility] ENDOSCOPY;  Service: Endoscopy;  Laterality: N/A;  . LEG TENDON SURGERY    . SHOULDER SURGERY    .  TONSILLECTOMY    . TOTAL KNEE ARTHROPLASTY      Family History  Problem Relation Age of Onset  . Breast cancer Mother 36   Social History:  reports that she quit smoking about 16 years ago. She has a 70.00 pack-year smoking history. She has never used smokeless tobacco. She reports that she does not drink alcohol or use drugs.  Allergies:  Allergies  Allergen Reactions  . Cat Hair Extract Other (See Comments)    Congestion, watery eyes   . Oxycodone Other (See Comments)    Withdrawal symptoms    Prior to Admission medications   Medication Sig Start Date End Date Taking? Authorizing Provider  acetaminophen (TYLENOL) 500 MG tablet Take 1,000 mg by mouth every 6 (six) hours as needed for mild pain or fever.    Yes Historical Provider, MD  albuterol (VENTOLIN HFA) 108 (90 Base) MCG/ACT inhaler Inhale 2 puffs into the lungs every 6 (six) hours as needed for wheezing or shortness of breath.  05/01/14  Yes Historical Provider, MD  cyanocobalamin (,VITAMIN B-12,) 1000 MCG/ML injection Inject 1,000 mcg into the muscle every 30 (thirty) days.   Yes Historical Provider, MD  diltiazem (CARDIZEM CD) 180 MG 24 hr capsule Take 180 mg by mouth daily.  09/24/15  Yes Historical Provider, MD  fenofibrate 160 MG tablet Take 160 mg by mouth daily. 04/15/16  Yes Historical Provider, MD  glimepiride (AMARYL) 1 MG tablet Take  1 mg by mouth daily with breakfast.  02/24/15  Yes Historical Provider, MD  glucose blood (ONE TOUCH ULTRA TEST) test strip 1 each by Other route as needed for other.  07/29/15  Yes Historical Provider, MD  levothyroxine (SYNTHROID, LEVOTHROID) 50 MCG tablet Take 50 mcg by mouth daily before breakfast.  11/25/15  Yes Historical Provider, MD  lisinopril (PRINIVIL,ZESTRIL) 20 MG tablet Take 20 mg by mouth daily.  04/01/15  Yes Historical Provider, MD  pantoprazole (PROTONIX) 40 MG tablet Take 40 mg by mouth daily.  04/01/15  Yes Historical Provider, MD  sertraline (ZOLOFT) 100 MG tablet Take 100  mg by mouth daily.   Yes Historical Provider, MD  Vitamin D, Ergocalciferol, (DRISDOL) 50000 units CAPS capsule Take 50,000 Units by mouth 2 (two) times a week.  01/02/15  Yes Historical Provider, MD  fenofibrate 160 MG tablet Take 160 mg by mouth daily.  02/04/15 02/04/16  Historical Provider, MD     Results for orders placed or performed during the hospital encounter of 07/12/16 (from the past 48 hour(s))  Comprehensive metabolic panel     Status: Abnormal   Collection Time: 07/12/16  2:31 AM  Result Value Ref Range   Sodium 138 135 - 145 mmol/L   Potassium 3.8 3.5 - 5.1 mmol/L   Chloride 101 101 - 111 mmol/L   CO2 29 22 - 32 mmol/L   Glucose, Bld 162 (H) 65 - 99 mg/dL   BUN 22 (H) 6 - 20 mg/dL   Creatinine, Ser 0.86 0.44 - 1.00 mg/dL   Calcium 9.1 8.9 - 10.3 mg/dL   Total Protein 8.1 6.5 - 8.1 g/dL   Albumin 4.0 3.5 - 5.0 g/dL   AST 18 15 - 41 U/L   ALT 13 (L) 14 - 54 U/L   Alkaline Phosphatase 79 38 - 126 U/L   Total Bilirubin 0.5 0.3 - 1.2 mg/dL   GFR calc non Af Amer >60 >60 mL/min   GFR calc Af Amer >60 >60 mL/min    Comment: (NOTE) The eGFR has been calculated using the CKD EPI equation. This calculation has not been validated in all clinical situations. eGFR's persistently <60 mL/min signify possible Chronic Kidney Disease.    Anion gap 8 5 - 15  CBC WITH DIFFERENTIAL     Status: Abnormal   Collection Time: 07/12/16  2:31 AM  Result Value Ref Range   WBC 21.5 (H) 3.6 - 11.0 K/uL   RBC 5.05 3.80 - 5.20 MIL/uL   Hemoglobin 13.1 12.0 - 16.0 g/dL   HCT 39.6 35.0 - 47.0 %   MCV 78.6 (L) 80.0 - 100.0 fL   MCH 26.0 26.0 - 34.0 pg   MCHC 33.1 32.0 - 36.0 g/dL   RDW 15.9 (H) 11.5 - 14.5 %   Platelets 293 150 - 440 K/uL   Neutrophils Relative % 78 %   Neutro Abs 16.9 (H) 1.4 - 6.5 K/uL   Lymphocytes Relative 14 %   Lymphs Abs 3.0 1.0 - 3.6 K/uL   Monocytes Relative 7 %   Monocytes Absolute 1.5 (H) 0.2 - 0.9 K/uL   Eosinophils Relative 0 %   Eosinophils Absolute 0.0 0 -  0.7 K/uL   Basophils Relative 1 %   Basophils Absolute 0.1 0 - 0.1 K/uL  Urinalysis, Routine w reflex microscopic     Status: Abnormal   Collection Time: 07/12/16  2:31 AM  Result Value Ref Range   Color, Urine YELLOW (A) YELLOW   APPearance  CLEAR (A) CLEAR   Specific Gravity, Urine 1.018 1.005 - 1.030   pH 5.0 5.0 - 8.0   Glucose, UA NEGATIVE NEGATIVE mg/dL   Hgb urine dipstick NEGATIVE NEGATIVE   Bilirubin Urine NEGATIVE NEGATIVE   Ketones, ur NEGATIVE NEGATIVE mg/dL   Protein, ur NEGATIVE NEGATIVE mg/dL   Nitrite NEGATIVE NEGATIVE   Leukocytes, UA TRACE (A) NEGATIVE   RBC / HPF 0-5 0 - 5 RBC/hpf   WBC, UA 6-30 0 - 5 WBC/hpf   Bacteria, UA RARE (A) NONE SEEN   Squamous Epithelial / LPF 0-5 (A) NONE SEEN   Mucous PRESENT   Lactic acid, plasma     Status: None   Collection Time: 07/12/16  2:32 AM  Result Value Ref Range   Lactic Acid, Venous 1.7 0.5 - 1.9 mmol/L   Ct Angio Chest Pe W And/or Wo Contrast  Result Date: 07/12/2016 CLINICAL DATA:  Right flank pain and shortness of breath. EXAM: CT ANGIOGRAPHY CHEST WITH CONTRAST TECHNIQUE: Multidetector CT imaging of the chest was performed using the standard protocol during bolus administration of intravenous contrast. Multiplanar CT image reconstructions and MIPs were obtained to evaluate the vascular anatomy. CONTRAST:  100 cc Isovue 370 IV COMPARISON:  Chest radiograph earlier this day.  Chest CT 05/09/2012 FINDINGS: Cardiovascular: There are no filling defects within the pulmonary arteries to suggest pulmonary embolus. No aortic dissection. Scattered coronary artery calcifications are seen. Mild cardiomegaly. No pericardial fluid. Mediastinum/Nodes: Anterior mediastinal node measures 12 mm. Prominent subcarinal/right infrahilar node measures 1.4 cm. Patulous esophagus. Visualized thyroid gland is unremarkable. No axillary adenopathy. Lungs/Pleura: Rounded masslike opacity in the posterior right lower lobe measures approximately 4.7 x 4.0  cm. Some internal air bronchograms are present in the periphery. There is adjacent ground-glass opacity. This abuts the diaphragm. Smaller focal opacity at the periphery of the lingula and left lower lobe. No pulmonary edema. No pleural fluid. Upper Abdomen: Characterized on CT abdomen/ pelvis performed concurrently. Musculoskeletal: There are no acute or suspicious osseous abnormalities. Review of the MIP images confirms the above findings. IMPRESSION: 1. Rounded masslike opacity in the posterior right lower lobe with surrounding ground-glass opacities. Differential includes focal pneumonia versus neoplasm. Given history of fever, pneumonia is favored, however close clinical follow-up is recommended. Recommend CT follow-up (abnormality not well visualized radiographically) in 3-4 weeks after a course of antibiotics to evaluate for clearing. 2. Minimal lingular and left lower lobe opacity may be atelectasis or pneumonia. 3. A few prominent mediastinal lymph nodes are likely reactive, can be reassessed on follow-up imaging. 4. No pulmonary embolus. Electronically Signed   By: Jeb Levering M.D.   On: 07/12/2016 05:00   Ct Abdomen Pelvis W Contrast  Result Date: 07/12/2016 CLINICAL DATA:  Right flank pain.  Shortness of breath. EXAM: CT ABDOMEN AND PELVIS WITH CONTRAST TECHNIQUE: Multidetector CT imaging of the abdomen and pelvis was performed using the standard protocol following bolus administration of intravenous contrast. CONTRAST:  100 cc Isovue 370 IV COMPARISON:  None. FINDINGS: Lower chest: Assessed on dedicated chest CT performed concurrently. Rounded right lung base opacity. Hepatobiliary: The liver is enlarged spanning 23.3 cm. Mild decreased density, no focal hepatic lesion. Gallbladder physiologically distended, no calcified stone. No biliary dilatation. Pancreas: No ductal dilatation or inflammation. Spleen: Normal in size without focal abnormality. Adrenals/Urinary Tract: No adrenal nodule. No  hydronephrosis or perinephric edema. Rounded hypodensity in the upper right kidney measure simple fluid, there is adjacent scarring of the right renal cortex. There is an indeterminate  1.4 cm low-density lesion in the parapelvic lower left kidney with possible enhancement on delayed phase imaging. The ureters are decompressed. Urinary bladder is minimally distended without stone. Stomach/Bowel: Stomach is physiologically distended. No bowel wall thickening or inflammation. No obstruction. Normal appendix. There are 2 fat density lesions within the colon. At the hepatic flexure there is IIA 1.6 cm lesion, at the splenic flexure there is a 2.8 cm lesion. No surrounding inflammation or findings for obstruction. Minimal diverticulosis of the descending and sigmoid colon without acute inflammation. Vascular/Lymphatic: Aortic atherosclerosis. No enlarged abdominal or pelvic lymph nodes. Reproductive: Uterus and bilateral adnexa are unremarkable. Other: No free air, free fluid, or intra-abdominal fluid collection. Small fat containing umbilical hernia. Musculoskeletal: There are no acute or suspicious osseous abnormalities. Degenerative disc disease and facet arthropathy in the lumbar spine. IMPRESSION: 1. No acute abnormality in the abdomen/pelvis. 2. **An incidental finding of potential clinical significance has been found. Indeterminate left renal lesion measuring 1.4 cm, 0 possible delayed enhancement. Recommend renal protocol MRI for characterization on a nonemergent basis.** 3. Incidental note of intramural lipoma is in the colon at the hepatic and splenic flexure. No evidence of obstruction. Mild distal colonic diverticulosis without acute diverticulitis. 4. Heaptomegaly and probable steatosis. Electronically Signed   By: Jeb Levering M.D.   On: 07/12/2016 04:49   Dg Chest Portable 1 View  Result Date: 07/12/2016 CLINICAL DATA:  Right flank pain shortness of breath EXAM: PORTABLE CHEST 1 VIEW COMPARISON:   06/26/2016, 10/29/2015 FINDINGS: Slight increased opacity at the left CP angle. Hazy bibasilar opacities otherwise are unchanged. Stable cardiomegaly with minimal central congestion. Cervical spine hardware. No pneumothorax. IMPRESSION: 1. Slight increased opacity at the left CP angle, cannot exclude mild atelectasis or infiltrate 2. Stable cardiomegaly with mild central congestion Electronically Signed   By: Donavan Foil M.D.   On: 07/12/2016 02:53    Review of Systems  Constitutional: Positive for chills and fever (subjective).  HENT: Negative for sore throat and tinnitus.   Eyes: Negative for blurred vision and redness.  Respiratory: Positive for cough. Negative for shortness of breath.   Cardiovascular: Negative for chest pain, palpitations, orthopnea and PND.  Gastrointestinal: Negative for abdominal pain, diarrhea, nausea and vomiting.  Genitourinary: Negative for dysuria, frequency and urgency.  Musculoskeletal: Positive for back pain (right posterior chest). Negative for joint pain and myalgias.  Skin: Negative for rash.       No lesions  Neurological: Negative for speech change, focal weakness and weakness.  Endo/Heme/Allergies: Does not bruise/bleed easily.       No temperature intolerance  Psychiatric/Behavioral: Negative for depression and suicidal ideas.    Blood pressure (!) 167/86, pulse (!) 106, temperature (!) 101.2 F (38.4 C), temperature source Oral, resp. rate (!) 22, height 5' 3"  (1.6 m), weight 108.9 kg (240 lb), SpO2 95 %. Physical Exam  Vitals reviewed. Constitutional: She is oriented to person, place, and time. She appears well-developed and well-nourished. No distress.  HENT:  Head: Normocephalic and atraumatic.  Mouth/Throat: Oropharynx is clear and moist.  Eyes: Conjunctivae and EOM are normal. Pupils are equal, round, and reactive to light. No scleral icterus.  Neck: Normal range of motion. Neck supple. No JVD present. No tracheal deviation present. No  thyromegaly present.  Cardiovascular: Normal rate, regular rhythm and normal heart sounds.  Exam reveals no gallop and no friction rub.   No murmur heard. Respiratory: Effort normal. She has decreased breath sounds in the right lower field.  GI: Soft. Bowel  sounds are normal. She exhibits no distension. There is no tenderness.  Genitourinary:  Genitourinary Comments: Deferred  Musculoskeletal: Normal range of motion. She exhibits no edema.  Lymphadenopathy:    She has no cervical adenopathy.  Neurological: She is alert and oriented to person, place, and time. No cranial nerve deficit. She exhibits normal muscle tone.  Skin: Skin is warm and dry. No rash noted. No erythema.  Psychiatric: She has a normal mood and affect. Her behavior is normal. Judgment and thought content normal.     Assessment/Plan This is a 61 year old female admitted for sepsis. 1. Sepsis: The patient meets criteria via fever, tachycardia, tachypnea and leukocytosis. She is hemodynamically stable. Follow blood cultures for growth and sensitivities. 2. Pneumonia: Community-acquired. Supplemental oxygen as needed. Broaden antibiotic coverage. Discontinue vancomycin if MRSA PCR is negative. 3. Hyperlipidemia: Continue fenofibrate 4. Hypothyroidism: Continue Synthroid. Check TSH 5. Hypertension: Controlled; continue the diltiazem and lisinopril 6. Depression: Continue Zoloft 7. DVT prophylaxis: Lovenox 8. GI prophylaxis: Pantoprazole per home regimen The patient is a full code. Time spent on admission orders and patient care approximately 45 minutes  Harrie Foreman, MD 07/12/2016, 5:22 AM

## 2016-07-12 NOTE — ED Notes (Signed)
Patient's O2 saturation decreased to 90% on RA. RN placed patient on 2L [place].

## 2016-07-12 NOTE — Consult Note (Signed)
Urology Consult  I have been asked to see the patient by Dr. Manuella Ghazi, for evaluation and management of right flank pain.  Chief Complaint: right flank pain  History of Present Illness: [name] is a 61 y.o. year old diabetic fail admitted to the hospitalist service for acute onset right flank pain radiating to her right hip and leg.  She describes the pain is fairly sudden onset yesterday afternoon and has not improved much since then. The pain is constant and improves only minimally with pain medication. Her pain is worse with movement and deep breathing. Nothing seems to make it better. She has no prior history of kidney stones or renal abnormalities.  No personal history of pyelonephritis. She denies any urinary symptoms including dysuria, or frequency. She does have severe issues at baseline urinary urgency which is unchanged.     She also has a fairly significant leukocytosis to 21.5 and fevers to 101. CT chest abdomen and pelvis with contrast shows possible right lower lobe groundglass opacity consistent with pneumonia versus neoplasm.    On CT scan, she has no evidence of hydronephrosis, obstructing stones, or any stranding around her kidney to suggest the pain is renal in etiology. She does have an incidental 1.4 cm left lower pole peripelvic indeterminate lesion, further assess with MRI which is limited secondary to motion artifact.  She states that she was in able to hold her breath due to pain with this.  Past Medical History:  Diagnosis Date  . Anemia   . Anxiety   . Arthritis   . Asthma   . Benign essential tremor   . Claustrophobia   . Depression   . Dysrhythmia   . Fibromyalgia   . Gastric ulcer   . GERD (gastroesophageal reflux disease)   . Headache   . Hemorrhoids   . History of chicken pox   . History of motion sickness   . History of pleurisy   . History of seasonal allergies   . Hyperlipidemia   . Hypertension   . Hypothyroidism   . Leukocytosis   . PONV  (postoperative nausea and vomiting)     Past Surgical History:  Procedure Laterality Date  . BREAST BIOPSY Bilateral 1970's  . BREAST BIOPSY Left    stereotactic  . CESAREAN SECTION    . COLONOSCOPY WITH PROPOFOL N/A 04/14/2016   Procedure: COLONOSCOPY WITH PROPOFOL;  Surgeon: Manya Silvas, MD;  Location: [facility] ENDOSCOPY;  Service: Endoscopy;  Laterality: N/A;  . ESOPHAGOGASTRODUODENOSCOPY (EGD) WITH PROPOFOL N/A 04/14/2016   Procedure: ESOPHAGOGASTRODUODENOSCOPY (EGD) WITH PROPOFOL;  Surgeon: Manya Silvas, MD;  Location: [facility] ENDOSCOPY;  Service: Endoscopy;  Laterality: N/A;  . LEG TENDON SURGERY    . SHOULDER SURGERY    . TONSILLECTOMY    . TOTAL KNEE ARTHROPLASTY      Home Medications:  Current Meds  Medication Sig  . acetaminophen (TYLENOL) 500 MG tablet Take 1,000 mg by mouth every 6 (six) hours as needed for mild pain or fever.   Marland Kitchen albuterol (VENTOLIN HFA) 108 (90 Base) MCG/ACT inhaler Inhale 2 puffs into the lungs every 6 (six) hours as needed for wheezing or shortness of breath.   . cyanocobalamin (,VITAMIN B-12,) 1000 MCG/ML injection Inject 1,000 mcg into the muscle every 30 (thirty) days.  Marland Kitchen diltiazem (CARDIZEM CD) 180 MG 24 hr capsule Take 180 mg by mouth daily.   . fenofibrate 160 MG tablet Take 160 mg by mouth daily.  Marland Kitchen glimepiride (AMARYL) 1 MG  tablet Take 1 mg by mouth daily with breakfast.   . glucose blood (ONE TOUCH ULTRA TEST) test strip 1 each by Other route as needed for other.   . levothyroxine (SYNTHROID, LEVOTHROID) 50 MCG tablet Take 50 mcg by mouth daily before breakfast.   . lisinopril (PRINIVIL,ZESTRIL) 20 MG tablet Take 20 mg by mouth daily.   . pantoprazole (PROTONIX) 40 MG tablet Take 40 mg by mouth daily.   . sertraline (ZOLOFT) 100 MG tablet Take 100 mg by mouth daily.  . Vitamin D, Ergocalciferol, (DRISDOL) 50000 units CAPS capsule Take 50,000 Units by mouth 2 (two) times a week.     Allergies:  Allergies  Allergen Reactions  . Cat Hair  Extract Other (See Comments)    Congestion, watery eyes   . Oxycodone Other (See Comments)    Withdrawal symptoms    Family History  Problem Relation Age of Onset  . Breast cancer Mother 19    Social History:  reports that she quit smoking about 16 years ago. She has a 70.00 pack-year smoking history. She has never used smokeless tobacco. She reports that she does not drink alcohol or use drugs.  ROS: A complete review of systems was performed. She does complain in addition to above of shortness of breath, fevers, myalgia, malaise, and mild nausea.   All others systems are negative except for pertinent findings as noted.  Physical Exam:  Vital signs in last 24 hours: Temp:  [97.9 F (36.6 C)-101.2 F (38.4 C)] 97.9 F (36.6 C) (01/15 1409) Pulse Rate:  [77-106] 77 (01/15 1519) Resp:  [12-40] 18 (01/15 1409) BP: (100-179)/(42-86) 128/44 (01/15 1519) SpO2:  [91 %-97 %] 91 % (01/15 1409) Weight:  [240 lb (108.9 kg)] 240 lb (108.9 kg) (01/15 0154) Constitutional:  Alert and oriented, No acute distress.  Sitting up on edge of bed. HEENT: Brooten AT, moist mucus membranes.  Trachea midline, no masses Cardiovascular: Regular rate and rhythm, no clubbing, cyanosis, or edema. Respiratory: Normal respiratory effort, lungs clear bilaterally GI: Abdomen is soft, nontender, nondistended, no abdominal masses GU: Mild right greater than left CVA tenderness to percussion. Pain is reproducible with palpation of her paraspinous musculature, right hip flexors, and right gluteus. Skin: No rashes, bruises or suspicious lesions Neurologic: Grossly intact, no focal deficits, moving all 4 extremities.  No lower extremity weakness. Sensation is intact. Psychiatric: Normal mood and affect   Laboratory Data:   Recent Labs  07/12/16 0231  WBC 21.5*  HGB 13.1  HCT 39.6    Recent Labs  07/12/16 0231  NA 138  K 3.8  CL 101  CO2 29  GLUCOSE 162*  BUN 22*  CREATININE 0.86  CALCIUM 9.1   No  results for input(s): LABPT, INR in the last 72 hours. No results for input(s): LABURIN in the last 72 hours. Results for orders placed or performed during the hospital encounter of 07/12/16  Blood Culture (routine x 2)     Status: None (Preliminary result)   Collection Time: 07/12/16  2:25 AM  Result Value Ref Range Status   Specimen Description BLOOD LEFT ASSIST CONTROL  Final   Special Requests   Final    BOTTLES DRAWN AEROBIC AND ANAEROBIC AERO14ML,ANAERO14ML   Culture NO GROWTH < 12 HOURS  Final   Report Status PENDING  Incomplete  Blood Culture (routine x 2)     Status: None (Preliminary result)   Collection Time: 07/12/16  2:30 AM  Result Value Ref Range Status  Specimen Description BLOOD LEFT ASSIST CONTROL  Final   Special Requests   Final    BOTTLES DRAWN AEROBIC AND ANAEROBIC AERO14ML,ANAERO15ML   Culture NO GROWTH < 12 HOURS  Final   Report Status PENDING  Incomplete    Component     Latest Ref Rng & Units 07/12/2016  pH     5.0 - 8.0 5.0  Protein     NEGATIVE mg/dL NEGATIVE  Nitrite     NEGATIVE NEGATIVE  Mucous      PRESENT  Color, Urine     YELLOW YELLOW (A)  Appearance     CLEAR CLEAR (A)  Specific Gravity, Urine     1.005 - 1.030 1.018  Glucose     NEGATIVE mg/dL NEGATIVE  Hgb urine dipstick     NEGATIVE NEGATIVE  Bilirubin Urine     NEGATIVE NEGATIVE  Ketones, ur     NEGATIVE mg/dL NEGATIVE  Leukocytes, UA     NEGATIVE TRACE (A)  RBC / HPF     0 - 5 RBC/hpf 0-5  WBC, UA     0 - 5 WBC/hpf 6-30  Bacteria, UA     NONE SEEN RARE (A)  Squamous Epithelial / LPF     NONE SEEN 0-5 (A)   Radiologic Imaging: Ct Angio Chest Pe W And/or Wo Contrast  Result Date: 07/12/2016 CLINICAL DATA:  Right flank pain and shortness of breath. EXAM: CT ANGIOGRAPHY CHEST WITH CONTRAST TECHNIQUE: Multidetector CT imaging of the chest was performed using the standard protocol during bolus administration of intravenous contrast. Multiplanar CT image reconstructions and  MIPs were obtained to evaluate the vascular anatomy. CONTRAST:  100 cc Isovue 370 IV COMPARISON:  Chest radiograph earlier this day.  Chest CT 05/09/2012 FINDINGS: Cardiovascular: There are no filling defects within the pulmonary arteries to suggest pulmonary embolus. No aortic dissection. Scattered coronary artery calcifications are seen. Mild cardiomegaly. No pericardial fluid. Mediastinum/Nodes: Anterior mediastinal node measures 12 mm. Prominent subcarinal/right infrahilar node measures 1.4 cm. Patulous esophagus. Visualized thyroid gland is unremarkable. No axillary adenopathy. Lungs/Pleura: Rounded masslike opacity in the posterior right lower lobe measures approximately 4.7 x 4.0 cm. Some internal air bronchograms are present in the periphery. There is adjacent ground-glass opacity. This abuts the diaphragm. Smaller focal opacity at the periphery of the lingula and left lower lobe. No pulmonary edema. No pleural fluid. Upper Abdomen: Characterized on CT abdomen/ pelvis performed concurrently. Musculoskeletal: There are no acute or suspicious osseous abnormalities. Review of the MIP images confirms the above findings. IMPRESSION: 1. Rounded masslike opacity in the posterior right lower lobe with surrounding ground-glass opacities. Differential includes focal pneumonia versus neoplasm. Given history of fever, pneumonia is favored, however close clinical follow-up is recommended. Recommend CT follow-up (abnormality not well visualized radiographically) in 3-4 weeks after a course of antibiotics to evaluate for clearing. 2. Minimal lingular and left lower lobe opacity may be atelectasis or pneumonia. 3. A few prominent mediastinal lymph nodes are likely reactive, can be reassessed on follow-up imaging. 4. No pulmonary embolus. Electronically Signed   By: Jeb Levering M.D.   On: 07/12/2016 05:00   Mr Abdomen W Wo Contrast  Result Date: 07/12/2016 CLINICAL DATA:  Left renal lesion on CT. EXAM: MRI ABDOMEN  WITHOUT AND WITH CONTRAST TECHNIQUE: Multiplanar multisequence MR imaging of the abdomen was performed both before and after the administration of intravenous contrast. CONTRAST:  92mL MULTIHANCE GADOBENATE DIMEGLUMINE 529 MG/ML IV SOLN COMPARISON:  CT of earlier in the day FINDINGS:  Multifactorial degradation, primarily secondary to respiratory motion throughout. Exam is also degraded by patient body habitus. Lower chest: Cardiomegaly. Right base abnormality as detailed on CT. Hepatobiliary: Marked hepatomegaly at 24.1 cm craniocaudal. Moderate to marked hepatic steatosis. No focal liver lesion. Normal gallbladder, without biliary ductal dilatation. Pancreas:  Normal, without mass or ductal dilatation. Spleen:  Normal in size, without focal abnormality. Adrenals/Urinary Tract:  Normal adrenal glands. Corresponding to the CT abnormality, in the lower pole left kidney, is a 1.4 cm T2 hyperintense lesion, including on image 9/series 3 and image 17/series 4. This is not well evaluated on pre and postcontrast T1 weighted imaging. An upper pole right renal lesion measures 11 mm on image 8/series 4. This is also T2 hyperintense, including image 5/series 3. Also not well evaluated on pre and postcontrast T1 weighted imaging. No hydronephrosis. Stomach/Bowel: Normal stomach and abdominal small bowel loops. Colonic lipomas are identified within the transverse colon, including on image 21/ series 15 and image 22/ series 15. The larger, in the distal transverse colon, measures up to 4.6 cm. No obstruction. Vascular/Lymphatic: Normal caliber of the aorta and branch vessels. No retroperitoneal or retrocrural adenopathy. Other:  No ascites. Musculoskeletal: No acute osseous abnormality. IMPRESSION: 1. Multifactorial degradation, as detailed above. 2. Left renal lesion is suboptimally evaluated but favored to represent a cyst or minimally complex cyst. Given limitations of the current exam, consider imaging follow-up at 12 months.  Renal protocol CT CT may be preferred to MRI, given today's limitations. 3. Hepatic steatosis and hepatomegaly. 4. Smaller right upper pole renal lesion is also likely a cyst or minimally complex cyst but warrants followup attention. 5. Transverse colonic lipomas, without obstruction. Electronically Signed   By: Abigail Miyamoto M.D.   On: 07/12/2016 13:29   Ct Abdomen Pelvis W Contrast  Result Date: 07/12/2016 CLINICAL DATA:  Right flank pain.  Shortness of breath. EXAM: CT ABDOMEN AND PELVIS WITH CONTRAST TECHNIQUE: Multidetector CT imaging of the abdomen and pelvis was performed using the standard protocol following bolus administration of intravenous contrast. CONTRAST:  100 cc Isovue 370 IV COMPARISON:  None. FINDINGS: Lower chest: Assessed on dedicated chest CT performed concurrently. Rounded right lung base opacity. Hepatobiliary: The liver is enlarged spanning 23.3 cm. Mild decreased density, no focal hepatic lesion. Gallbladder physiologically distended, no calcified stone. No biliary dilatation. Pancreas: No ductal dilatation or inflammation. Spleen: Normal in size without focal abnormality. Adrenals/Urinary Tract: No adrenal nodule. No hydronephrosis or perinephric edema. Rounded hypodensity in the upper right kidney measure simple fluid, there is adjacent scarring of the right renal cortex. There is an indeterminate 1.4 cm low-density lesion in the parapelvic lower left kidney with possible enhancement on delayed phase imaging. The ureters are decompressed. Urinary bladder is minimally distended without stone. Stomach/Bowel: Stomach is physiologically distended. No bowel wall thickening or inflammation. No obstruction. Normal appendix. There are 2 fat density lesions within the colon. At the hepatic flexure there is IIA 1.6 cm lesion, at the splenic flexure there is a 2.8 cm lesion. No surrounding inflammation or findings for obstruction. Minimal diverticulosis of the descending and sigmoid colon without  acute inflammation. Vascular/Lymphatic: Aortic atherosclerosis. No enlarged abdominal or pelvic lymph nodes. Reproductive: Uterus and bilateral adnexa are unremarkable. Other: No free air, free fluid, or intra-abdominal fluid collection. Small fat containing umbilical hernia. Musculoskeletal: There are no acute or suspicious osseous abnormalities. Degenerative disc disease and facet arthropathy in the lumbar spine. IMPRESSION: 1. No acute abnormality in the abdomen/pelvis. 2. **An incidental finding  of potential clinical significance has been found. Indeterminate left renal lesion measuring 1.4 cm, 0 possible delayed enhancement. Recommend renal protocol MRI for characterization on a nonemergent basis.** 3. Incidental note of intramural lipoma is in the colon at the hepatic and splenic flexure. No evidence of obstruction. Mild distal colonic diverticulosis without acute diverticulitis. 4. Heaptomegaly and probable steatosis. Electronically Signed   By: Jeb Levering M.D.   On: 07/12/2016 04:49   Dg Chest Portable 1 View  Result Date: 07/12/2016 CLINICAL DATA:  Right flank pain shortness of breath EXAM: PORTABLE CHEST 1 VIEW COMPARISON:  06/26/2016, 10/29/2015 FINDINGS: Slight increased opacity at the left CP angle. Hazy bibasilar opacities otherwise are unchanged. Stable cardiomegaly with minimal central congestion. Cervical spine hardware. No pneumothorax. IMPRESSION: 1. Slight increased opacity at the left CP angle, cannot exclude mild atelectasis or infiltrate 2. Stable cardiomegaly with mild central congestion Electronically Signed   By: Donavan Foil M.D.   On: 07/12/2016 02:53   CT abdomen pelvis and abdominal MRI were personally reviewed today.  Impression/ Plan:  61 year old female with a history of diabetes and fibromyalgia with acute onset right flank pain radiating to her right hip flexors worse with inspiration x 24 hours.  No clear GU pathology on CT abdomen and pelvis.    1. Right flank  pain- etiology remains unclear. On review the CT scan, there is no stranding around the kidney or any other pathology identified. Based on the nature of her pain, I feel that it unrelated to her kidney in that it is not colicky, extends far beyond the right flank, and and is somewhat pleuritic in nature exacerbated by deep breathing.  2. Left renal lesion- small 1.4 cm left renal lesion, incomplete characterized a both on CT scan as well as MRI limited by motion artifact.  She also has a smaller right upper pole lesion which is similar. We discussed today that is likely benign, however, would recommend follow-up with MRI in one year to rule out underlying pathology. I explained that given the very small size, if there is unidentified pathology, these lesions tend to grow quite slowly. Importance of follow-up in 1 year was stressed.  Thank you for involving me in the care of this patient. Urology will sign off and arrange for outpatient follow-up in 1 year is recommended. Please call with any questions or concerns.  07/12/2016, 5:37 PM  Hollice Espy,  MD

## 2016-07-12 NOTE — Progress Notes (Signed)
[facility] NAME: [name]    MR#:  [id number]  DATE OF BIRTH:  08/31/1955  SUBJECTIVE:  CHIEF COMPLAINT:   Chief Complaint  Patient presents with  . Flank Pain  . Shortness of Breath  Still in lot of pain. REVIEW OF SYSTEMS:  Review of Systems  Constitutional: Positive for fever and malaise/fatigue. Negative for chills and weight loss.  HENT: Negative for nosebleeds and sore throat.   Eyes: Negative for blurred vision.  Respiratory: Negative for cough, shortness of breath and wheezing.   Cardiovascular: Negative for chest pain, orthopnea, leg swelling and PND.  Gastrointestinal: Negative for abdominal pain, constipation, diarrhea, heartburn, nausea and vomiting.  Genitourinary: Positive for flank pain. Negative for dysuria and urgency.  Musculoskeletal: Positive for back pain.  Skin: Negative for rash.  Neurological: Negative for dizziness, speech change, focal weakness and headaches.  Endo/Heme/Allergies: Does not bruise/bleed easily.  Psychiatric/Behavioral: Negative for depression.   DRUG ALLERGIES:   Allergies  Allergen Reactions  . Cat Hair Extract Other (See Comments)    Congestion, watery eyes   . Oxycodone Other (See Comments)    Withdrawal symptoms   VITALS:  Blood pressure (!) 102/56, pulse 86, temperature 98.1 F (36.7 C), temperature source Oral, resp. rate (!) 29, height 5\' 3"  (1.6 m), weight 108.9 kg (240 lb), SpO2 93 %. PHYSICAL EXAMINATION:  Physical Exam  Constitutional: She is oriented to person, place, and time and well-developed, well-nourished, and in no distress.  HENT:  Head: Normocephalic and atraumatic.  Eyes: Conjunctivae and EOM are normal. Pupils are equal, round, and reactive to light.  Neck: Normal range of motion. Neck supple. No tracheal deviation present. No thyromegaly present.  Cardiovascular: Normal rate, regular rhythm and normal heart sounds.   Pulmonary/Chest: Effort normal  and breath sounds normal. No respiratory distress. She has no wheezes. She exhibits no tenderness.  Abdominal: Soft. Bowel sounds are normal. She exhibits no distension. There is no tenderness.  Musculoskeletal: Normal range of motion.  Neurological: She is alert and oriented to person, place, and time. No cranial nerve deficit.  Skin: Skin is warm and dry. No rash noted.  Psychiatric: Mood and affect normal.   LABORATORY PANEL:   CBC  Recent Labs Lab 07/12/16 0231  WBC 21.5*  HGB 13.1  HCT 39.6  PLT 293   ------------------------------------------------------------------------------------------------------------------ Chemistries   Recent Labs Lab 07/12/16 0231  NA 138  K 3.8  CL 101  CO2 29  GLUCOSE 162*  BUN 22*  CREATININE 0.86  CALCIUM 9.1  AST 18  ALT 13*  ALKPHOS 79  BILITOT 0.5   RADIOLOGY:  Ct Angio Chest Pe W And/or Wo Contrast  Result Date: 07/12/2016 CLINICAL DATA:  Right flank pain and shortness of breath. EXAM: CT ANGIOGRAPHY CHEST WITH CONTRAST TECHNIQUE: Multidetector CT imaging of the chest was performed using the standard protocol during bolus administration of intravenous contrast. Multiplanar CT image reconstructions and MIPs were obtained to evaluate the vascular anatomy. CONTRAST:  100 cc Isovue 370 IV COMPARISON:  Chest radiograph earlier this day.  Chest CT 05/09/2012 FINDINGS: Cardiovascular: There are no filling defects within the pulmonary arteries to suggest pulmonary embolus. No aortic dissection. Scattered coronary artery calcifications are seen. Mild cardiomegaly. No pericardial fluid. Mediastinum/Nodes: Anterior mediastinal node measures 12 mm. Prominent subcarinal/right infrahilar node measures 1.4 cm. Patulous esophagus. Visualized thyroid gland is unremarkable. No axillary adenopathy. Lungs/Pleura: Rounded masslike opacity in the posterior right lower lobe measures  approximately 4.7 x 4.0 cm. Some internal air bronchograms are present in the  periphery. There is adjacent ground-glass opacity. This abuts the diaphragm. Smaller focal opacity at the periphery of the lingula and left lower lobe. No pulmonary edema. No pleural fluid. Upper Abdomen: Characterized on CT abdomen/ pelvis performed concurrently. Musculoskeletal: There are no acute or suspicious osseous abnormalities. Review of the MIP images confirms the above findings. IMPRESSION: 1. Rounded masslike opacity in the posterior right lower lobe with surrounding ground-glass opacities. Differential includes focal pneumonia versus neoplasm. Given history of fever, pneumonia is favored, however close clinical follow-up is recommended. Recommend CT follow-up (abnormality not well visualized radiographically) in 3-4 weeks after a course of antibiotics to evaluate for clearing. 2. Minimal lingular and left lower lobe opacity may be atelectasis or pneumonia. 3. A few prominent mediastinal lymph nodes are likely reactive, can be reassessed on follow-up imaging. 4. No pulmonary embolus. Electronically Signed   By: Jeb Levering M.D.   On: 07/12/2016 05:00   Ct Abdomen Pelvis W Contrast  Result Date: 07/12/2016 CLINICAL DATA:  Right flank pain.  Shortness of breath. EXAM: CT ABDOMEN AND PELVIS WITH CONTRAST TECHNIQUE: Multidetector CT imaging of the abdomen and pelvis was performed using the standard protocol following bolus administration of intravenous contrast. CONTRAST:  100 cc Isovue 370 IV COMPARISON:  None. FINDINGS: Lower chest: Assessed on dedicated chest CT performed concurrently. Rounded right lung base opacity. Hepatobiliary: The liver is enlarged spanning 23.3 cm. Mild decreased density, no focal hepatic lesion. Gallbladder physiologically distended, no calcified stone. No biliary dilatation. Pancreas: No ductal dilatation or inflammation. Spleen: Normal in size without focal abnormality. Adrenals/Urinary Tract: No adrenal nodule. No hydronephrosis or perinephric edema. Rounded hypodensity  in the upper right kidney measure simple fluid, there is adjacent scarring of the right renal cortex. There is an indeterminate 1.4 cm low-density lesion in the parapelvic lower left kidney with possible enhancement on delayed phase imaging. The ureters are decompressed. Urinary bladder is minimally distended without stone. Stomach/Bowel: Stomach is physiologically distended. No bowel wall thickening or inflammation. No obstruction. Normal appendix. There are 2 fat density lesions within the colon. At the hepatic flexure there is IIA 1.6 cm lesion, at the splenic flexure there is a 2.8 cm lesion. No surrounding inflammation or findings for obstruction. Minimal diverticulosis of the descending and sigmoid colon without acute inflammation. Vascular/Lymphatic: Aortic atherosclerosis. No enlarged abdominal or pelvic lymph nodes. Reproductive: Uterus and bilateral adnexa are unremarkable. Other: No free air, free fluid, or intra-abdominal fluid collection. Small fat containing umbilical hernia. Musculoskeletal: There are no acute or suspicious osseous abnormalities. Degenerative disc disease and facet arthropathy in the lumbar spine. IMPRESSION: 1. No acute abnormality in the abdomen/pelvis. 2. **An incidental finding of potential clinical significance has been found. Indeterminate left renal lesion measuring 1.4 cm, 0 possible delayed enhancement. Recommend renal protocol MRI for characterization on a nonemergent basis.** 3. Incidental note of intramural lipoma is in the colon at the hepatic and splenic flexure. No evidence of obstruction. Mild distal colonic diverticulosis without acute diverticulitis. 4. Heaptomegaly and probable steatosis. Electronically Signed   By: Jeb Levering M.D.   On: 07/12/2016 04:49   Dg Chest Portable 1 View  Result Date: 07/12/2016 CLINICAL DATA:  Right flank pain shortness of breath EXAM: PORTABLE CHEST 1 VIEW COMPARISON:  06/26/2016, 10/29/2015 FINDINGS: Slight increased opacity  at the left CP angle. Hazy bibasilar opacities otherwise are unchanged. Stable cardiomegaly with minimal central congestion. Cervical spine hardware. No pneumothorax. IMPRESSION:  1. Slight increased opacity at the left CP angle, cannot exclude mild atelectasis or infiltrate 2. Stable cardiomegaly with mild central congestion Electronically Signed   By: Donavan Foil M.D.   On: 07/12/2016 02:53   ASSESSMENT AND PLAN:  This is a 61 year old female admitted for sepsis.  1. Sepsis: Present on admission.  Due to pneumonia and/or viral infection.  Check pro calcitonin and lactic acid - Right flank pain - CT scan shows an 1.4 cm renal cyst, they recommended MRI of the abdomen which we will go and order discussed with Dr. Hollice Espy from urology who will see her  2. Pneumonia: Community-acquired.  - Continue antibiotics for now 3. Hyperlipidemia: Continue fenofibrate 4. Hypothyroidism: Continue Synthroid. normal TSH 5. Hypertension: Controlled; continue the diltiazem and lisinopril 6. Depression: Continue Zoloft 7. DVT prophylaxis: Lovenox 8. GI prophylaxis: Pantoprazole per home regimen     All the records are reviewed and case discussed with Care Management/Social Worker. Management plans discussed with the patient, family and they are in agreement.  CODE STATUS: Full code  TOTAL TIME TAKING CARE OF THIS PATIENT: 35 minutes.   More than 50% of the time was spent in counseling/coordination of care: YES  POSSIBLE D/C IN 1-2 DAYS, DEPENDING ON CLINICAL CONDITION.   Max Sane M.D on 07/12/2016 at 9:46 AM  Between 7am to 6pm - Pager - [phone]  After 6pm go to [email or web address] - Proofreader  [facility]  Office  [phone]  CC: Primary care physician; [place] Pink, MD  Note: This dictation was prepared with Dragon dictation along with smaller phrase technology. Any transcriptional errors that result from this process are unintentional.

## 2016-07-12 NOTE — ED Provider Notes (Signed)
[facility] Emergency Department Provider Note   ____________________________________________   First MD Initiated Contact with Patient 07/12/16 0202     (approximate)  I have reviewed the triage vital signs and the nursing notes.   HISTORY  Chief Complaint Flank Pain and Shortness of Breath    HPI [name] is a 61 y.o. female who comes in complaining of right sided flank pain. Started around 9:30 last night. She became shortness of breath with it as well and has a fever. She had a headache several days ago but this is nothing to do with the headache she thinks that she's not having headache right now. She was okay yesterday morning. The pain is lateral to the CVA area and goes from her lower chest into the upper right side abdomen   Past Medical History:  Diagnosis Date  . Anemia   . Anxiety   . Arthritis   . Asthma   . Benign essential tremor   . Claustrophobia   . Depression   . Dysrhythmia   . Fibromyalgia   . Gastric ulcer   . GERD (gastroesophageal reflux disease)   . Headache   . Hemorrhoids   . History of chicken pox   . History of motion sickness   . History of pleurisy   . History of seasonal allergies   . Hyperlipidemia   . Hypertension   . Hypothyroidism   . Leukocytosis   . PONV (postoperative nausea and vomiting)     There are no active problems to display for this patient.   Past Surgical History:  Procedure Laterality Date  . BREAST BIOPSY Bilateral 1970's  . BREAST BIOPSY Left    stereotactic  . CESAREAN SECTION    . COLONOSCOPY WITH PROPOFOL N/A 04/14/2016   Procedure: COLONOSCOPY WITH PROPOFOL;  Surgeon: Manya Silvas, MD;  Location: [facility] ENDOSCOPY;  Service: Endoscopy;  Laterality: N/A;  . ESOPHAGOGASTRODUODENOSCOPY (EGD) WITH PROPOFOL N/A 04/14/2016   Procedure: ESOPHAGOGASTRODUODENOSCOPY (EGD) WITH PROPOFOL;  Surgeon: Manya Silvas, MD;  [facility] ENDOSCOPY;  Service: Endoscopy;  Laterality: N/A;    . LEG TENDON SURGERY    . SHOULDER SURGERY    . TONSILLECTOMY    . TOTAL KNEE ARTHROPLASTY      Prior to Admission medications   Medication Sig Start Date End Date Taking? Authorizing Provider  acetaminophen (TYLENOL) 500 MG tablet Take by mouth.    Historical Provider, MD  albuterol (VENTOLIN HFA) 108 (90 Base) MCG/ACT inhaler  05/01/14   Historical Provider, MD  diltiazem (CARDIZEM CD) 180 MG 24 hr capsule  09/24/15   Historical Provider, MD  fenofibrate 160 MG tablet Take by mouth. 02/04/15 02/04/16  Historical Provider, MD  glimepiride (AMARYL) 1 MG tablet  02/24/15   Historical Provider, MD  glucose blood (ONE TOUCH ULTRA TEST) test strip  07/29/15   Historical Provider, MD  levothyroxine (SYNTHROID, LEVOTHROID) 50 MCG tablet  11/25/15   Historical Provider, MD  lisinopril (PRINIVIL,ZESTRIL) 20 MG tablet Take by mouth. 04/01/15   Historical Provider, MD  pantoprazole (PROTONIX) 40 MG tablet Take by mouth. 04/01/15   Historical Provider, MD  sertraline (ZOLOFT) 100 MG tablet Take 100 mg by mouth daily.    Historical Provider, MD  umeclidinium-vilanterol (ANORO ELLIPTA) 62.5-25 MCG/INH AEPB Inhale 1 puff into the lungs daily. 09/30/15   Wilhelmina Mcardle, MD  vitamin B-12 (CYANOCOBALAMIN) 1000 MCG tablet Take 1,000 mcg by mouth daily.    Historical Provider, MD  Vitamin D,  Ergocalciferol, (DRISDOL) 50000 units CAPS capsule  01/02/15   Historical Provider, MD    Allergies Cat hair extract and Oxycodone  Family History  Problem Relation Age of Onset  . Breast cancer Mother 55    Social History Social History  Substance Use Topics  . Smoking status: Former Smoker    Packs/day: 2.00    Years: 35.00    Quit date: 07/01/2000  . Smokeless tobacco: Never Used  . Alcohol use No    Review of Systems Constitutional fever/chills Eyes: No visual changes. ENT: No sore throat. Cardiovascular chest pain. Respiratory:shortness of breath. Gastrointestinal:abdominal pain.  No nausea, no vomiting.  No  diarrhea.  No constipation. Genitourinary: Negative for dysuria. Musculoskeletal:  for back pain. Skin: Negative for rash.   10-point ROS otherwise negative.  ____________________________________________   PHYSICAL EXAM:  VITAL SIGNS: ED Triage Vitals  Enc Vitals Group     BP 07/12/16 0153 (!) 179/80     Pulse Rate 07/12/16 0153 (!) 105     Resp 07/12/16 0153 (!) 40     Temp 07/12/16 0153 (!) 101.2 F (38.4 C)     Temp Source 07/12/16 0153 Oral     SpO2 07/12/16 0153 94 %     Weight 07/12/16 0154 240 lb (108.9 kg)     Height 07/12/16 0154 5\' 3"  (1.6 m)     Head Circumference --      Peak Flow --      Pain Score 07/12/16 0155 10     Pain Loc --      Pain Edu? --      Excl. in [place]? --     Constitutional: Alert and oriented. Ill appearing and in  acute distress. Eyes: Conjunctivae are normal. PERRL. EOMI. Head: Atraumatic. Nose: No congestion/rhinnorhea. Mouth/Throat: Mucous membranes are moist.  Oropharynx non-erythematous. Neck: No stridor.   }Cardiovascular: Normal rate, regular rhythm. Grossly normal heart sounds.  Good peripheral circulation. Respiratory: Normal respiratory effort.  No retractions. Lungs CTAB. Gastrointestinal: Soft and nontender. No distention. No abdominal bruits. No CVA tenderness. Musculoskeletal: No lower extremity tenderness nor edema.  No joint effusions. Neurologic:  Normal speech and language. No gross focal neurologic deficits are appreciated. No gait instability. Skin:  Skin is warm, dry and intact. No rash noted.   ____________________________________________   LABS (all labs ordered are listed, but only abnormal results are displayed)  Labs Reviewed  COMPREHENSIVE METABOLIC PANEL - Abnormal; Notable for the following:       Result Value   Glucose, Bld 162 (*)    BUN 22 (*)    ALT 13 (*)    All other components within normal limits  CBC WITH DIFFERENTIAL/PLATELET - Abnormal; Notable for the following:    WBC 21.5 (*)    MCV  78.6 (*)    RDW 15.9 (*)    Neutro Abs 16.9 (*)    Monocytes Absolute 1.5 (*)    All other components within normal limits  URINALYSIS, ROUTINE W REFLEX MICROSCOPIC - Abnormal; Notable for the following:    Color, Urine YELLOW (*)    APPearance CLEAR (*)    Leukocytes, UA TRACE (*)    Bacteria, UA RARE (*)    Squamous Epithelial / LPF 0-5 (*)    All other components within normal limits  CULTURE, BLOOD (ROUTINE X 2)  CULTURE, BLOOD (ROUTINE X 2)  URINE CULTURE  LACTIC ACID, PLASMA   ____________________________________________  EKG   ____________________________________________  RADIOLOGY Study Result   CLINICAL  DATA:  Right flank pain shortness of breath  EXAM: PORTABLE CHEST 1 VIEW  COMPARISON:  06/26/2016, 10/29/2015  FINDINGS: Slight increased opacity at the left CP angle. Hazy bibasilar opacities otherwise are unchanged. Stable cardiomegaly with minimal central congestion. Cervical spine hardware. No pneumothorax.  IMPRESSION: 1. Slight increased opacity at the left CP angle, cannot exclude mild atelectasis or infiltrate 2. Stable cardiomegaly with mild central congestion   Electronically Signed   By: Donavan Foil M.D.   On: 07/12/2016 02:53      ____________________________________________   PROCEDURES  Procedure(s) performed:  Procedures  Critical Care performed:   ____________________________________________   INITIAL IMPRESSION / ASSESSMENT AND PLAN / ED COURSE  Pertinent labs & imaging results that were available during my care of the patient were reviewed by me and considered in my medical decision making (see chart for details).    Clinical Course      ____________________________________________   FINAL CLINICAL IMPRESSION(S) / ED DIAGNOSES  Final diagnoses:  Community acquired pneumonia of right lower lobe of lung ([place])      NEW MEDICATIONS STARTED DURING THIS VISIT:  New Prescriptions   No medications on  file     Note:  This document was prepared using Dragon voice recognition software and may include unintentional dictation errors.    Nena Polio, MD 07/12/16 [phone]

## 2016-07-12 NOTE — Consult Note (Signed)
Pharmacy Antibiotic Note  [name] is a 61 y.o. female admitted on 07/12/2016 with pneumonia and flank pain, found to have renal cyst Pharmacy has been consulted for zosyn and vancomycin dosing. PCT resulted at <0.1- ok to d/c vanc per Dr. Manuella Ghazi. Wait on urology before d/c zosyn  Plan: Zosyn 3.375g IV q8h (4 hour infusion).  Height: 5\' 3"  (160 cm) Weight: 240 lb (108.9 kg) IBW/kg (Calculated) : 52.4  Temp (24hrs), Avg:99 F (37.2 C), Min:97.9 F (36.6 C), Max:101.2 F (38.4 C)   Recent Labs Lab 07/12/16 0231 07/12/16 0232  WBC 21.5*  --   CREATININE 0.86  --   LATICACIDVEN  --  1.7    Estimated Creatinine Clearance: 80.3 mL/min (by C-G formula based on SCr of 0.86 mg/dL).    Allergies  Allergen Reactions  . Cat Hair Extract Other (See Comments)    Congestion, watery eyes   . Oxycodone Other (See Comments)    Withdrawal symptoms    Antimicrobials this admission: levofloxacin 1/15 >> 1/15 zosyn 1/15 >>  Vancomycin 1/15>>one dose  Dose adjustments this admission:   Microbiology results: 1/15 BCx:  1/15 UCx:    Thank you for allowing pharmacy to be a part of this patient's care.  Ramond Dial, Pharm.D, BCPS Clinical Pharmacist  07/12/2016 3:19 PM

## 2016-07-12 NOTE — ED Notes (Signed)
Pigtail from medial left AC IV was loosened. RN tightened cap and replaced dressing.

## 2016-07-13 LAB — CBC
HCT: 33.8 % — ABNORMAL LOW (ref 35.0–47.0)
HEMOGLOBIN: 11.1 g/dL — AB (ref 12.0–16.0)
MCH: 26.2 pg (ref 26.0–34.0)
MCHC: 32.8 g/dL (ref 32.0–36.0)
MCV: 79.8 fL — AB (ref 80.0–100.0)
PLATELETS: 246 10*3/uL (ref 150–440)
RBC: 4.24 MIL/uL (ref 3.80–5.20)
RDW: 15.8 % — ABNORMAL HIGH (ref 11.5–14.5)
WBC: 17.1 10*3/uL — ABNORMAL HIGH (ref 3.6–11.0)

## 2016-07-13 LAB — BASIC METABOLIC PANEL
Anion gap: 8 (ref 5–15)
BUN: 16 mg/dL (ref 6–20)
CHLORIDE: 102 mmol/L (ref 101–111)
CO2: 27 mmol/L (ref 22–32)
Calcium: 8.7 mg/dL — ABNORMAL LOW (ref 8.9–10.3)
Creatinine, Ser: 0.73 mg/dL (ref 0.44–1.00)
GFR calc Af Amer: >60 mL/min (ref >60)
GFR calc non Af Amer: >60 mL/min (ref >60)
GLUCOSE: 207 mg/dL — AB (ref 65–99)
POTASSIUM: 3.7 mmol/L (ref 3.5–5.1)
SODIUM: 137 mmol/L (ref 135–145)

## 2016-07-13 LAB — HEMOGLOBIN A1C
Hgb A1c MFr Bld: 6.6 % — ABNORMAL HIGH (ref 4.8–5.6)
Mean Plasma Glucose: 143 mg/dL

## 2016-07-13 LAB — GLUCOSE, CAPILLARY: Glucose-Capillary: 133 mg/dL — ABNORMAL HIGH (ref 65–99)

## 2016-07-13 LAB — URINE CULTURE

## 2016-07-13 NOTE — Telephone Encounter (Signed)
appt made and mailed to the patient  [facility]

## 2016-07-13 NOTE — Discharge Instructions (Addendum)
Back Pain, Adult Introduction Back pain is very common. The pain often gets better over time. The cause of back pain is usually not dangerous. Most people can learn to manage their back pain on their own. Follow these instructions at home: Watch your back pain for any changes. The following actions may help to lessen any pain you are feeling:  Stay active. Start with short walks on flat ground if you can. Try to walk farther each day.  Exercise regularly as told by your doctor. Exercise helps your back heal faster. It also helps avoid future injury by keeping your muscles strong and flexible.  Do not sit, drive, or stand in one place for more than 30 minutes.  Do not stay in bed. Resting more than 1-2 days can slow down your recovery.  Be careful when you bend or lift an object. Use good form when lifting:  Bend at your knees.  Keep the object close to your body.  Do not twist.  Sleep on a firm mattress. Lie on your side, and bend your knees. If you lie on your back, put a pillow under your knees.  Take medicines only as told by your doctor.  Put ice on the injured area.  Put ice in a plastic bag.  Place a towel between your skin and the bag.  Leave the ice on for 20 minutes, 2-3 times a day for the first 2-3 days. After that, you can switch between ice and heat packs.  Avoid feeling anxious or stressed. Find good ways to deal with stress, such as exercise.  Maintain a healthy weight. Extra weight puts stress on your back. Contact a doctor if:  You have pain that does not go away with rest or medicine.  You have worsening pain that goes down into your legs or buttocks.  You have pain that does not get better in one week.  You have pain at night.  You lose weight.  You have a fever or chills. Get help right away if:  You cannot control when you poop (bowel movement) or pee (urinate).  Your arms or legs feel weak.  Your arms or legs lose feeling  (numbness).  You feel sick to your stomach (nauseous) or throw up (vomit).  You have belly (abdominal) pain.  You feel like you may pass out (faint). This information is not intended to replace advice given to you by your health care provider. Make sure you discuss any questions you have with your health care provider. Document Released: 12/01/2007 Document Revised: 11/20/2015 Document Reviewed: 10/16/2013  2017 Elsevier  

## 2016-07-13 NOTE — Plan of Care (Signed)
MD making rounds. Received order to discharge home. IV removed. No prescriptions. Discharge paperwork provided, explained, signed and witnessed. No unanswered questions. Discharged via wheelchair with auxiliary staff. Belongings sent with patient and family.

## 2016-07-16 NOTE — Discharge Summary (Signed)
[facility] [place] NAME: [name]    MR#:  [id number]  DATE OF BIRTH:  29-Sep-1955  DATE OF ADMISSION:  07/12/2016   ADMITTING PHYSICIAN: Harrie Foreman, MD  DATE OF DISCHARGE: 07/13/2016 11:10 AM  PRIMARY CARE PHYSICIAN: Maryland Pink, MD   ADMISSION DIAGNOSIS:  Community acquired pneumonia of right lower lobe of lung ([place]) [J18.1] DISCHARGE DIAGNOSIS:  Active Problems:   Sepsis ([place])  SECONDARY DIAGNOSIS:   Past Medical History:  Diagnosis Date  . Anemia   . Anxiety   . Arthritis   . Asthma   . Benign essential tremor   . Claustrophobia   . Depression   . Dysrhythmia   . Fibromyalgia   . Gastric ulcer   . GERD (gastroesophageal reflux disease)   . Headache   . Hemorrhoids   . History of chicken pox   . History of motion sickness   . History of pleurisy   . History of seasonal allergies   . Hyperlipidemia   . Hypertension   . Hypothyroidism   . Leukocytosis   . PONV (postoperative nausea and vomiting)    HOSPITAL COURSE:  This is a 61 year old female admitted for sepsis.  1. Sepsis: Present on admission.  Due to viral infection. normal pro calcitonin  2. Pneumonia: ruled out 3. Rt flank pain/back pain: follows with pain mgmt as an outpt for lumbar radiculopathy  DISCHARGE CONDITIONS:  stable CONSULTS OBTAINED:  Treatment Team:  Hollice Espy, MD DRUG ALLERGIES:   Allergies  Allergen Reactions  . Cat Hair Extract Other (See Comments)    Congestion, watery eyes   . Oxycodone Other (See Comments)    Withdrawal symptoms   DISCHARGE MEDICATIONS:   Allergies as of 07/13/2016      Reactions   Cat Hair Extract Other (See Comments)   Congestion, watery eyes    Oxycodone Other (See Comments)   Withdrawal symptoms      Medication List    TAKE these medications   acetaminophen 500 MG tablet Commonly known as:  TYLENOL Take 1,000 mg by mouth every 6 (six) hours as needed for mild pain or fever.   cyanocobalamin 1000 MCG/ML injection Commonly known as:  (VITAMIN B-12) Inject 1,000 mcg into the muscle every 30 (thirty) days.   diltiazem 180 MG 24 hr capsule Commonly known as:  CARDIZEM CD Take 180 mg by mouth daily.   fenofibrate 160 MG tablet Take 160 mg by mouth daily.   fenofibrate 160 MG tablet Take 160 mg by mouth daily.   glimepiride 1 MG tablet Commonly known as:  AMARYL Take 1 mg by mouth daily with breakfast.   levothyroxine 50 MCG tablet Commonly known as:  SYNTHROID, LEVOTHROID Take 50 mcg by mouth daily before breakfast.   lisinopril 20 MG tablet Commonly known as:  PRINIVIL,ZESTRIL Take 20 mg by mouth daily.   ONE TOUCH ULTRA TEST test strip Generic drug:  glucose blood 1 each by Other route as needed for other.   pantoprazole 40 MG tablet Commonly known as:  PROTONIX Take 40 mg by mouth daily.   sertraline 100 MG tablet Commonly known as:  ZOLOFT Take 100 mg by mouth daily.   VENTOLIN HFA 108 (90 Base) MCG/ACT inhaler Generic drug:  albuterol Inhale 2 puffs into the lungs every 6 (six) hours as needed for wheezing or shortness of breath.   Vitamin D (Ergocalciferol) 50000 units Caps capsule Commonly known as:  DRISDOL Take 50,000  Units by mouth 2 (two) times a week.        DISCHARGE INSTRUCTIONS:   DIET:  Regular diet DISCHARGE CONDITION:  Good ACTIVITY:  Activity as tolerated OXYGEN:  Home Oxygen: No.  Oxygen Delivery: room air DISCHARGE LOCATION:  home   If you experience worsening of your admission symptoms, develop shortness of breath, life threatening emergency, suicidal or homicidal thoughts you must seek medical attention immediately by calling 911 or calling your MD immediately  if symptoms less severe.  You Must read complete instructions/literature along with all the possible adverse reactions/side effects for all the Medicines you take and that have been prescribed to you. Take any new Medicines after you have  completely understood and accpet all the possible adverse reactions/side effects.   Please note  You were cared for by a hospitalist during your hospital stay. If you have any questions about your discharge medications or the care you received while you were in the hospital after you are discharged, you can call the unit and asked to speak with the hospitalist on call if the hospitalist that took care of you is not available. Once you are discharged, your primary care physician will handle any further medical issues. Please note that NO REFILLS for any discharge medications will be authorized once you are discharged, as it is imperative that you return to your primary care physician (or establish a relationship with a primary care physician if you do not have one) for your aftercare needs so that they can reassess your need for medications and monitor your lab values.    On the day of Discharge:  VITAL SIGNS:  Blood pressure (!) 124/52, pulse 63, temperature 97.7 F (36.5 C), temperature source Oral, resp. rate 20, height 5\' 3"  (1.6 m), weight 111.7 kg (246 lb 3.2 oz), SpO2 92 %. PHYSICAL EXAMINATION:  GENERAL:  61 y.o.-year-old patient lying in the bed with no acute distress.  EYES: Pupils equal, round, reactive to light and accommodation. No scleral icterus. Extraocular muscles intact.  HEENT: Head atraumatic, normocephalic. Oropharynx and nasopharynx clear.  NECK:  Supple, no jugular venous distention. No thyroid enlargement, no tenderness.  LUNGS: Normal breath sounds bilaterally, no wheezing, rales,rhonchi or crepitation. No use of accessory muscles of respiration.  CARDIOVASCULAR: S1, S2 normal. No murmurs, rubs, or gallops.  ABDOMEN: Soft, non-tender, non-distended. Bowel sounds present. No organomegaly or mass.  EXTREMITIES: No pedal edema, cyanosis, or clubbing.  NEUROLOGIC: Cranial nerves II through XII are intact. Muscle strength 5/5 in all extremities. Sensation intact. Gait not  checked.  PSYCHIATRIC: The patient is alert and oriented x 3.  SKIN: No obvious rash, lesion, or ulcer.  DATA REVIEW:   CBC  Recent Labs Lab 07/13/16 0406  WBC 17.1*  HGB 11.1*  HCT 33.8*  PLT 246    Chemistries   Recent Labs Lab 07/12/16 0231 07/13/16 0406  NA 138 137  K 3.8 3.7  CL 101 102  CO2 29 27  GLUCOSE 162* 207*  BUN 22* 16  CREATININE 0.86 0.73  CALCIUM 9.1 8.7*  AST 18  --   ALT 13*  --   ALKPHOS 79  --   BILITOT 0.5  --     Follow-up Information    Maryland Pink, MD. Go on 07/26/2016.   Specialty:  Family Medicine Why:  @1 :30 PM Contact information: [place] [address] [phone]        Donzetta Starch, MD Follow up on  08/10/2016.   Specialty:  Physical Medicine and Rehabilitation Contact information: [address] [place] [address] [phone]           Management plans discussed with the patient, family and they are in agreement.  CODE STATUS: FULL CODE  TOTAL TIME TAKING CARE OF THIS PATIENT: 45 minutes.    Max Sane M.D on 07/16/2016 at 11:35 PM  Between 7am to 6pm - Pager - [phone]  After 6pm go to [email or web address] - Proofreader  [facility]  Office  [phone]  CC: Primary care physician; Maryland Pink, MD   Note: This dictation was prepared with Dragon dictation along with smaller phrase technology. Any transcriptional errors that result from this process are unintentional.

## 2016-07-17 LAB — CULTURE, BLOOD (ROUTINE X 2)
CULTURE: NO GROWTH
Culture: NO GROWTH

## 2016-07-20 ENCOUNTER — Telehealth: Payer: Self-pay | Admitting: *Deleted

## 2016-07-20 DIAGNOSIS — R51 Headache: Secondary | ICD-10-CM

## 2016-07-20 DIAGNOSIS — R519 Headache, unspecified: Secondary | ICD-10-CM | POA: Insufficient documentation

## 2016-07-20 NOTE — Telephone Encounter (Signed)
Spoke with pt and she is concerned about some scans she recently had. appt scheduled. nothing further needed.

## 2016-07-20 NOTE — Telephone Encounter (Signed)
Pt LM asking me to give her a call. When I called she was unavailable so I LM for pt to return my call back.

## 2016-07-23 ENCOUNTER — Ambulatory Visit (INDEPENDENT_AMBULATORY_CARE_PROVIDER_SITE_OTHER): Payer: BLUE CROSS/BLUE SHIELD | Admitting: Pulmonary Disease

## 2016-07-23 ENCOUNTER — Encounter: Payer: Self-pay | Admitting: Pulmonary Disease

## 2016-07-23 VITALS — BP 132/82 | HR 74 | Wt 238.0 lb

## 2016-07-23 DIAGNOSIS — J181 Lobar pneumonia, unspecified organism: Secondary | ICD-10-CM

## 2016-07-23 DIAGNOSIS — R918 Other nonspecific abnormal finding of lung field: Secondary | ICD-10-CM

## 2016-07-23 DIAGNOSIS — J189 Pneumonia, unspecified organism: Secondary | ICD-10-CM

## 2016-07-23 NOTE — Patient Instructions (Signed)
Follow up in 6-8 weeks with repeat CT scan of chest

## 2016-07-28 NOTE — Progress Notes (Signed)
PULMONARY OFFICE FOLLOW UP NOTE  Requesting MD/Service: Maryland Pink, MD Date of initial consultation: 09/30/15 Reason for consultation:  Dyspnea  PT PROFILE: 61 y.o. F former smoker of approx 30 p-y (quit 2002) referred for evaluation of exertional dyspnea. 09/30/15: Initial eval. Trial of Anoro inhaler. CXR and PFTs ordered. 10/30/15: Routine ROV: Anoro modestly beneficial. Still with mod-severe dyspnea on exertion. Complained of CP - nonexertional, positional, deemed likely due to GERD. CXR reviewed: cardiomegaly with interstitial prominence suggestive of CHF. PFTs revealed no obstruction, mild restriction, normal DLCO. PLAN: continue Anoro; check CBC (normal), BNP (normal); change PPI to dinner time; ROV 4-6 weeks  12/02/15: Improved DOE on Anoro. No new complaints. Discussed weight loss strategies. ROV 3 months with target weight of 225# 03/04/16 No new respiratory complaints. Weight is up 19#. Discussed weight loss strategies in detail INTERVAL: Hospitalized 01/15-01/16/18 when she presented with hip pain, myalgias, dyspnea, pleurodynia. Diagnosed with URI. Received one dose of abx. CXR and CT chest obtained.  SUBJ: As above. Remains with DOE/SOB worse than baseline and now class III/IV. CP has resolved. She has no fever. Has occasional cough that is minimally productive  Vitals:   07/23/16 1106  BP: 132/82  Pulse: 74  SpO2: 95%  Weight: 238 lb (108 kg)     EXAM:  Gen: Obese, no respiratory distress HEENT: NCAT, WNL Neck: Supple without LAN, thyromegaly, JVD Lungs: breath sounds full, No wheezes Cardiovascular: Normal rate, reg rhythm, no murmurs noted Abdomen: Soft, nontender, normal BS Ext: without clubbing, cyanosis. Trace ankle edema Neuro: grossly intact   DATA:  CXR: 07/12/16 - Slight increased opacity at the left CP angle, cannot exclude mild atelectasis or infiltrate CT chest 07/13/16:  Rounded masslike opacity in the posterior right lower lobe with surrounding  ground-glass opacities. Differential includes focal pneumonia versus neoplasm. Given history of fever, pneumonia is favored, however close clinical follow-up is recommended. PFTs: no new study  IMPRESSION:     ICD-9-CM ICD-10-CM   1. Suspected recent community acquired pneumonia of right lower lobe of lung based on CT chest 481 J18.1   2. Opacity of lung on imaging study 793.19 R91.8 CT CHEST WO CONTRAST   Restriction is due to obesity. Dyspnea is partially or fully explained by obesity although she does believe there has been some benefit from Anoro  PLAN:  1) Cont Anoro and PRN albuterol 2) We again discussed in detail weight loss strategies emphasizing a low carb approach 3) Follow up in 6-8 weeks with repeat CT chest to follow RLL opacity to resolution  Merton Border, MD PCCM service Mobile [phone] Pager [phone] 07/28/2016

## 2016-08-09 ENCOUNTER — Telehealth: Payer: Self-pay | Admitting: Pulmonary Disease

## 2016-08-09 NOTE — Telephone Encounter (Signed)
Pt called requesting CT Chest moved up to earlier that 09/06/16. Pt stated that she was not having any other symptoms that she was just anxious and worried that she had CA.  Please advise if we can move her CT Chest to an earlier appointment.  Rhonda J Cobb

## 2016-08-11 NOTE — Telephone Encounter (Signed)
Per DS pt needs to wait min of 6 weeks before she has her next CT scan. Called pt and spoke with her to inform her and since it was only about 2 week difference she will leave it scheduled for 09/06/16. Nothing further needed.

## 2016-09-06 ENCOUNTER — Ambulatory Visit
Admission: RE | Admit: 2016-09-06 | Discharge: 2016-09-06 | Disposition: A | Discharge status: Home / Self Care | Payer: BLUE CROSS/BLUE SHIELD | Source: Ambulatory Visit | Attending: Pulmonary Disease | Admitting: Pulmonary Disease

## 2016-09-06 DIAGNOSIS — I2510 Atherosclerotic heart disease of native coronary artery without angina pectoris: Secondary | ICD-10-CM | POA: Diagnosis not present

## 2016-09-06 DIAGNOSIS — R918 Other nonspecific abnormal finding of lung field: Secondary | ICD-10-CM

## 2016-09-06 DIAGNOSIS — J189 Pneumonia, unspecified organism: Secondary | ICD-10-CM | POA: Insufficient documentation

## 2016-09-06 DIAGNOSIS — K760 Fatty (change of) liver, not elsewhere classified: Secondary | ICD-10-CM | POA: Diagnosis not present

## 2016-09-06 DIAGNOSIS — I313 Pericardial effusion (noninflammatory): Secondary | ICD-10-CM | POA: Diagnosis not present

## 2016-09-06 DIAGNOSIS — I700 Atherosclerosis of aorta: Secondary | ICD-10-CM | POA: Insufficient documentation

## 2016-09-13 ENCOUNTER — Encounter: Payer: Self-pay | Admitting: Pulmonary Disease

## 2016-09-13 ENCOUNTER — Ambulatory Visit (INDEPENDENT_AMBULATORY_CARE_PROVIDER_SITE_OTHER): Payer: BLUE CROSS/BLUE SHIELD | Admitting: Pulmonary Disease

## 2016-09-13 VITALS — BP 138/74 | HR 83 | Wt 242.0 lb

## 2016-09-13 DIAGNOSIS — F411 Generalized anxiety disorder: Secondary | ICD-10-CM | POA: Diagnosis not present

## 2016-09-13 DIAGNOSIS — R918 Other nonspecific abnormal finding of lung field: Secondary | ICD-10-CM | POA: Diagnosis not present

## 2016-09-13 DIAGNOSIS — R0609 Other forms of dyspnea: Secondary | ICD-10-CM

## 2016-09-13 DIAGNOSIS — R942 Abnormal results of pulmonary function studies: Secondary | ICD-10-CM | POA: Diagnosis not present

## 2016-09-13 NOTE — Patient Instructions (Addendum)
1) You may stop the Anoro inhaler if you do not think that it is helping your breathing 2) We discussed weight loss strategies and stress reduction techniques 3) I do not think that you require a repeat CT scan of your chest 4) Follow up in 3 months. Target weight 230#

## 2016-09-13 NOTE — Progress Notes (Signed)
PULMONARY OFFICE FOLLOW UP NOTE  Requesting MD/Service: Maryland Pink, MD Date of initial consultation: 09/30/15 Reason for consultation:  Dyspnea  PT PROFILE: 61 y.o. F former smoker of approx 30 p-y (quit 2002) referred for evaluation of exertional dyspnea. 09/30/15: Initial eval. Trial of Anoro inhaler. CXR and PFTs ordered. 10/30/15: Routine ROV: Anoro modestly beneficial. Still with mod-severe dyspnea on exertion. Complained of CP - nonexertional, positional, deemed likely due to GERD. CXR reviewed: cardiomegaly with interstitial prominence suggestive of CHF. PFTs revealed no obstruction, mild restriction, normal DLCO. PLAN: continue Anoro; check CBC (normal), BNP (normal); change PPI to dinner time; ROV 4-6 weeks  12/02/15: Improved DOE on Anoro. No new complaints. Discussed weight loss strategies. ROV 3 months with target weight of 225# 03/04/16 No new respiratory complaints. Weight is up 19#. Discussed weight loss strategies in detail CT chest 07/13/16:  Rounded masslike opacity in the posterior right lower lobe with surrounding ground-glass opacities. Differential includes focal pneumonia versus neoplasm. Given history of fever, pneumonia is favored, however close clinical follow-up is recommended. 07/23/16 Office visit. Continue Anoro. Follow up in 6-8 weeks with repeat CT chest 09/06/16 CT chest: significant partial clearing of apparent pneumonia from the right lower lobe, posterior segment. A smaller area of irregular opacity remains in the posterior segment right lower lobe  INTERVAL: No major events  SUBJ: This is a routine reevaluation to follow-up on repeat CT scan of the chest. She has read the report of her recent CT scan and is quite anxious about verbiage in the report. She remains with DOE/SOB worse than baseline and now class III/IV. Her symptoms are not improved with Anoro inhaler. She denies chest pain, cough, purulent sputum, hemoptysis, lower extremity edema, calf  tenderness.   Vitals:   09/13/16 1018  BP: 138/74  Pulse: 83  SpO2: 96%  Weight: 242 lb (109.8 kg)     EXAM:  Gen: Obese, anxious, no respiratory distress HEENT: NCAT, WNL Neck: Supple without LAN, thyromegaly, JVD Lungs: breath sounds full, No wheezes Cardiovascular: Normal rate, reg rhythm, no murmurs noted Abdomen: Soft, nontender, normal BS Ext: without clubbing, cyanosis. Trace symmetric ankle edema Neuro: grossly intact   DATA:  CT scan is as noted above. The right lower lobe opacity is markedly improved.  CXR: No new film PFTs: no new study  IMPRESSION:     ICD-9-CM ICD-10-CM   1. DOE (dyspnea on exertion) 786.09 R06.09   2. Restrictive pattern present on pulmonary function testing 794.2 R94.2   3. Opacity of lung on imaging study 793.19 R91.8   4. Anxiety state 300.00 F41.1    No clear benefit from Anoro   PLAN:  1) We reviewed the CT scans together and I demonstrated the market improvement in the right lower lobe opacity. This went a long way to improve her anxiety over this matter. However, she does admit that anxiety has become a major problem in her life. She notably has many major life stressors. We discussed stress management strategies. I suggested that meditation might be beneficial to her. I suggested that she look into the book called 8 Minute Meditation  2) We discussed weight loss strategies again and I again emphasized a low glycemic index diet avoiding processed foods and simple carbohydrates and sugars 3) she may try off of the Anoro inhaler if she does not notice any difference in her symptoms 4) I do not think that a repeat CT scan of the chest is necessary. 5) follow-up in 3 months with a  target weight of 230 pounds  Merton Border, MD PCCM service Mobile [phone] Pager [phone] 09/13/2016

## 2016-09-17 ENCOUNTER — Telehealth: Payer: Self-pay | Admitting: Pulmonary Disease

## 2016-09-17 NOTE — Telephone Encounter (Signed)
error 

## 2016-10-04 DIAGNOSIS — M5412 Radiculopathy, cervical region: Secondary | ICD-10-CM | POA: Insufficient documentation

## 2016-10-04 DIAGNOSIS — G43009 Migraine without aura, not intractable, without status migrainosus: Secondary | ICD-10-CM | POA: Insufficient documentation

## 2016-11-02 ENCOUNTER — Encounter: Payer: Self-pay | Admitting: *Deleted

## 2016-11-03 ENCOUNTER — Encounter: Payer: Self-pay | Admitting: Certified Registered Nurse Anesthetist

## 2016-11-03 ENCOUNTER — Encounter: Payer: Self-pay | Admitting: *Deleted

## 2016-11-03 ENCOUNTER — Ambulatory Visit
Admission: RE | Admit: 2016-11-03 | Discharge: 2016-11-03 | Disposition: A | Discharge status: Home / Self Care | Payer: BLUE CROSS/BLUE SHIELD | Source: Ambulatory Visit | Attending: Unknown Physician Specialty | Admitting: Unknown Physician Specialty

## 2016-11-03 ENCOUNTER — Encounter
Admission: RE | Disposition: A | Discharge status: Home / Self Care | Payer: Self-pay | Source: Ambulatory Visit | Attending: Unknown Physician Specialty

## 2016-11-03 DIAGNOSIS — M1990 Unspecified osteoarthritis, unspecified site: Secondary | ICD-10-CM | POA: Insufficient documentation

## 2016-11-03 DIAGNOSIS — Z79899 Other long term (current) drug therapy: Secondary | ICD-10-CM | POA: Insufficient documentation

## 2016-11-03 DIAGNOSIS — R51 Headache: Secondary | ICD-10-CM | POA: Diagnosis not present

## 2016-11-03 DIAGNOSIS — D649 Anemia, unspecified: Secondary | ICD-10-CM | POA: Diagnosis not present

## 2016-11-03 DIAGNOSIS — D128 Benign neoplasm of rectum: Secondary | ICD-10-CM | POA: Diagnosis not present

## 2016-11-03 DIAGNOSIS — Z7984 Long term (current) use of oral hypoglycemic drugs: Secondary | ICD-10-CM | POA: Insufficient documentation

## 2016-11-03 DIAGNOSIS — Z1211 Encounter for screening for malignant neoplasm of colon: Secondary | ICD-10-CM | POA: Diagnosis not present

## 2016-11-03 DIAGNOSIS — Z8719 Personal history of other diseases of the digestive system: Secondary | ICD-10-CM | POA: Insufficient documentation

## 2016-11-03 DIAGNOSIS — E785 Hyperlipidemia, unspecified: Secondary | ICD-10-CM | POA: Diagnosis not present

## 2016-11-03 DIAGNOSIS — F329 Major depressive disorder, single episode, unspecified: Secondary | ICD-10-CM | POA: Diagnosis not present

## 2016-11-03 DIAGNOSIS — F419 Anxiety disorder, unspecified: Secondary | ICD-10-CM | POA: Insufficient documentation

## 2016-11-03 DIAGNOSIS — Z87891 Personal history of nicotine dependence: Secondary | ICD-10-CM | POA: Diagnosis not present

## 2016-11-03 DIAGNOSIS — J45909 Unspecified asthma, uncomplicated: Secondary | ICD-10-CM | POA: Diagnosis not present

## 2016-11-03 DIAGNOSIS — K219 Gastro-esophageal reflux disease without esophagitis: Secondary | ICD-10-CM | POA: Diagnosis not present

## 2016-11-03 DIAGNOSIS — M797 Fibromyalgia: Secondary | ICD-10-CM | POA: Diagnosis not present

## 2016-11-03 DIAGNOSIS — G250 Essential tremor: Secondary | ICD-10-CM | POA: Diagnosis not present

## 2016-11-03 DIAGNOSIS — I10 Essential (primary) hypertension: Secondary | ICD-10-CM | POA: Insufficient documentation

## 2016-11-03 DIAGNOSIS — Z86010 Personal history of colonic polyps: Secondary | ICD-10-CM | POA: Insufficient documentation

## 2016-11-03 DIAGNOSIS — E039 Hypothyroidism, unspecified: Secondary | ICD-10-CM | POA: Diagnosis not present

## 2016-11-03 HISTORY — PX: FLEXIBLE SIGMOIDOSCOPY: SHX5431

## 2016-11-03 HISTORY — DX: Radiculopathy, cervical region: M54.12

## 2016-11-03 HISTORY — DX: Fusion of spine, cervical region: M43.22

## 2016-11-03 SURGERY — SIGMOIDOSCOPY, FLEXIBLE
Anesthesia: General

## 2016-11-03 MED ORDER — DIPHENHYDRAMINE HCL 50 MG/ML IJ SOLN
INTRAMUSCULAR | Status: AC
  Administered 2016-11-03: 12.5 mg via INTRAVENOUS
  Filled 2016-11-03: qty 1

## 2016-11-03 MED ORDER — PIPERACILLIN-TAZOBACTAM 3.375 G IVPB 30 MIN
3.3750 g | Freq: Once | INTRAVENOUS | Status: AC
  Administered 2016-11-03: 3.375 g via INTRAVENOUS
  Filled 2016-11-03: qty 50

## 2016-11-03 MED ORDER — SODIUM CHLORIDE 0.9 % IV SOLN
INTRAVENOUS | Status: DC
  Administered 2016-11-03: 1000 mL via INTRAVENOUS

## 2016-11-03 MED ORDER — MIDAZOLAM HCL 5 MG/5ML IJ SOLN
INTRAMUSCULAR | Status: AC
  Filled 2016-11-03: qty 5

## 2016-11-03 MED ORDER — FENTANYL CITRATE (PF) 100 MCG/2ML IJ SOLN
INTRAMUSCULAR | Status: AC
  Filled 2016-11-03: qty 2

## 2016-11-03 MED ORDER — DIPHENHYDRAMINE HCL 50 MG/ML IJ SOLN
12.5000 mg | Freq: Once | INTRAMUSCULAR | Status: AC
  Administered 2016-11-03: 12.5 mg via INTRAVENOUS

## 2016-11-03 NOTE — Op Note (Signed)
[facility] Gastroenterology Patient Name: [name] Procedure Date: 11/03/2016 4:30 PM MRN: [id number] Account #: [id number] Date of Birth: 1956-05-28 Admit Type: Outpatient Age: 61 Room: [facility] ENDO ROOM 3 Gender: Female Note Status: Finalized Procedure:            Flexible Sigmoidoscopy Indications:          High risk colon cancer surveillance: Personal history                        of colonic polyps Providers:            Manya Silvas, MD Referring MD:         Irven Easterly. Kary Kos, MD (Referring MD) Medicines:            None Complications:        No immediate complications. Procedure:            Pre-Anesthesia Assessment:                       - After reviewing the risks and benefits, the patient                        was deemed in satisfactory condition to undergo the                        procedure.                       After obtaining informed consent, the scope was passed                        under direct vision. The Colonoscope was introduced                        through the anus and advanced to the the rectum. The                        flexible sigmoidoscopy was accomplished without                        difficulty. The patient tolerated the procedure well.                        The quality of the bowel preparation was excellent. Findings:      The digital rectal exam was normal.      Two sessile polyps were found in the rectum. The polyps were small in       size. These polyps were removed with a hot snare. Resection and       retrieval were complete. Impression:           - Two small polyps in the rectum, removed with a hot                        snare. Resected and retrieved. Recommendation:       - Await pathology results. Repeat flex sig in 3 months.                        If any remnant tissue remains consider anorectal  resection Manya Silvas, MD 11/03/2016 4:50:23 PM This report has been signed  electronically. Number of Addenda: 0 Note Initiated On: 11/03/2016 4:30 PM Total Procedure Duration: 0 hours 11 minutes 8 seconds       [facility]

## 2016-11-05 ENCOUNTER — Encounter: Payer: Self-pay | Admitting: Unknown Physician Specialty

## 2016-11-05 LAB — SURGICAL PATHOLOGY

## 2016-11-26 NOTE — H&P (Signed)
Primary Care Physician:  Maryland Pink, MD Primary Gastroenterologist:  Dr. Vira Agar  Pre-Procedure History & Physical: HPI:  [name] is a 61 y.o. female is here for an flexible sigmoidoscopy.   Past Medical History:  Diagnosis Date  . Anemia   . Anxiety   . Arthritis   . Asthma   . Benign essential tremor   . Claustrophobia   . Depression   . Dysrhythmia   . Fibromyalgia   . Fusion of spine of cervical region   . Gastric ulcer   . GERD (gastroesophageal reflux disease)   . Headache   . Hemorrhoids   . History of chicken pox   . History of motion sickness   . History of pleurisy   . History of seasonal allergies   . Hyperlipidemia   . Hypertension   . Hypothyroidism   . Leukocytosis   . PONV (postoperative nausea and vomiting)   . Radiculopathy of cervical region     Past Surgical History:  Procedure Laterality Date  . ALLOGRAFT APPLICATION    . BREAST BIOPSY Bilateral 1970's  . BREAST BIOPSY Left    stereotactic  . CESAREAN SECTION    . COLONOSCOPY WITH PROPOFOL N/A 04/14/2016   Procedure: COLONOSCOPY WITH PROPOFOL;  Surgeon: Manya Silvas, MD;  Location: [facility] ENDOSCOPY;  Service: Endoscopy;  Laterality: N/A;  . ESOPHAGOGASTRODUODENOSCOPY (EGD) WITH PROPOFOL N/A 04/14/2016   Procedure: ESOPHAGOGASTRODUODENOSCOPY (EGD) WITH PROPOFOL;  Surgeon: Manya Silvas, MD;  Location: [facility] ENDOSCOPY;  Service: Endoscopy;  Laterality: N/A;  . FLEXIBLE SIGMOIDOSCOPY N/A 11/03/2016   Procedure: FLEXIBLE SIGMOIDOSCOPY;  Surgeon: Manya Silvas, MD;  Location: [facility] ENDOSCOPY;  Service: Endoscopy;  Laterality: N/A;  . gastric ulcer    . JOINT REPLACEMENT    . LEG TENDON SURGERY    . SHOULDER SURGERY    . TONSILLECTOMY    . TOTAL KNEE ARTHROPLASTY      Prior to Admission medications   Medication Sig Start Date End Date Taking? Authorizing Provider  acetaminophen (TYLENOL) 500 MG tablet Take 1,000 mg by mouth every 6 (six) hours as needed for mild pain or fever.     Yes [provider]  albuterol (VENTOLIN HFA) 108 (90 Base) MCG/ACT inhaler Inhale 2 puffs into the lungs every 6 (six) hours as needed for wheezing or shortness of breath.  05/01/14  Yes [provider]  cyanocobalamin (,VITAMIN B-12,) 1000 MCG/ML injection Inject 1,000 mcg into the muscle every 30 (thirty) days.   Yes [provider]  diltiazem (CARDIZEM CD) 180 MG 24 hr capsule Take 180 mg by mouth daily.  09/24/15  Yes [provider]  fenofibrate 160 MG tablet Take 160 mg by mouth daily. 04/15/16  Yes [provider]  glimepiride (AMARYL) 1 MG tablet Take 1 mg by mouth daily with breakfast.  02/24/15  Yes [provider]  glucose blood (ONE TOUCH ULTRA TEST) test strip 1 each by Other route as needed for other.  07/29/15  Yes [provider]  levothyroxine (SYNTHROID, LEVOTHROID) 50 MCG tablet Take 50 mcg by mouth daily before breakfast.  11/25/15  Yes [provider]  lisinopril (PRINIVIL,ZESTRIL) 20 MG tablet Take 20 mg by mouth daily.  04/01/15  Yes [provider]  pantoprazole (PROTONIX) 40 MG tablet Take 40 mg by mouth daily.  04/01/15  Yes [provider]  sertraline (ZOLOFT) 100 MG tablet Take 100 mg by mouth daily.   Yes [provider]  Vitamin D, Ergocalciferol, (  DRISDOL) 50000 units CAPS capsule Take 50,000 Units by mouth 2 (two) times a week.  01/02/15  Yes [provider]    Allergies as of 09/06/2016 - Review Complete 07/28/2016  Allergen Reaction Noted  . Cat hair extract Other (See Comments) 09/30/2015  . Oxycodone Other (See Comments) 09/30/2015    Family History  Problem Relation Age of Onset  . Breast cancer Mother 63    Social History   Social History  . Marital status: Married    Spouse name: N/A  . Number of children: N/A  . Years of education: N/A   Occupational History  . Not on file.   Social History Main Topics  . Smoking status: Former Smoker     Packs/day: 2.00    Years: 35.00    Quit date: 07/01/2000  . Smokeless tobacco: Never Used  . Alcohol use No  . Drug use: No  . Sexual activity: Not on file   Other Topics Concern  . Not on file   Social History Narrative  . No narrative on file    Review of Systems: See HPI, otherwise negative ROS  Physical Exam: BP (!) 166/80   Pulse 67   Temp 98.4 F (36.9 C) (Tympanic)   Resp 16   Ht 5\' 4"  (1.626 m)   Wt 108.9 kg (240 lb)   SpO2 100%   BMI 41.20 kg/m  General:   Alert,  pleasant and cooperative in NAD Head:  Normocephalic and atraumatic. Neck:  Supple; no masses or thyromegaly. Lungs:  Clear throughout to auscultation.    Heart:  Regular rate and rhythm. Abdomen:  Soft, nontender and nondistended. Normal bowel sounds, without guarding, and without rebound.   Neurologic:  Alert and  oriented x4;  grossly normal neurologically.  Impression/Plan: [name] is here for an flexible sigmoidoscopy to be performed for Personal history of colon polyps.  Risks, benefits, limitations, and alternatives regarding  flexible sigmoidoscopy have been reviewed with the patient.  Questions have been answered.  All parties agreeable.   Gaylyn Cheers, MD  11/26/2016, 10:24 AM

## 2016-12-20 ENCOUNTER — Ambulatory Visit: Payer: BLUE CROSS/BLUE SHIELD | Admitting: Pulmonary Disease

## 2016-12-22 ENCOUNTER — Ambulatory Visit
Admission: RE | Admit: 2016-12-22 | Discharge: 2016-12-22 | Disposition: A | Discharge status: Home / Self Care | Payer: BLUE CROSS/BLUE SHIELD | Source: Ambulatory Visit | Attending: Pulmonary Disease | Admitting: Pulmonary Disease

## 2016-12-22 ENCOUNTER — Ambulatory Visit (INDEPENDENT_AMBULATORY_CARE_PROVIDER_SITE_OTHER): Payer: BLUE CROSS/BLUE SHIELD | Admitting: Pulmonary Disease

## 2016-12-22 ENCOUNTER — Encounter: Payer: Self-pay | Admitting: Pulmonary Disease

## 2016-12-22 VITALS — BP 132/78 | HR 64 | Ht 64.0 in | Wt 234.0 lb

## 2016-12-22 DIAGNOSIS — R942 Abnormal results of pulmonary function studies: Secondary | ICD-10-CM | POA: Diagnosis not present

## 2016-12-22 DIAGNOSIS — I517 Cardiomegaly: Secondary | ICD-10-CM | POA: Diagnosis not present

## 2016-12-22 DIAGNOSIS — R0609 Other forms of dyspnea: Secondary | ICD-10-CM

## 2016-12-22 DIAGNOSIS — R918 Other nonspecific abnormal finding of lung field: Secondary | ICD-10-CM | POA: Diagnosis not present

## 2016-12-22 DIAGNOSIS — E6609 Other obesity due to excess calories: Secondary | ICD-10-CM | POA: Diagnosis not present

## 2016-12-22 NOTE — Progress Notes (Signed)
PULMONARY OFFICE FOLLOW UP NOTE  Requesting MD/Service: Maryland Pink, MD Date of initial consultation: 09/30/15 Reason for consultation:  Dyspnea  PT PROFILE: 61 y.o. F former smoker of approx 30 p-y (quit 2002) referred for evaluation of exertional dyspnea. 09/30/15: Initial eval. Trial of Anoro inhaler. CXR and PFTs ordered. 10/30/15: Routine ROV: Anoro modestly beneficial. Still with mod-severe dyspnea on exertion. Complained of CP - nonexertional, positional, deemed likely due to GERD. CXR reviewed: cardiomegaly with interstitial prominence suggestive of CHF. PFTs revealed no obstruction, mild restriction, normal DLCO. PLAN: continue Anoro; check CBC (normal), BNP (normal); change PPI to dinner time; ROV 4-6 weeks  12/02/15: Improved DOE on Anoro. No new complaints. Discussed weight loss strategies. ROV 3 months with target weight of 225# 03/04/16 No new respiratory complaints. Weight is up 19#. Discussed weight loss strategies in detail CT chest 07/13/16:  Rounded masslike opacity in the posterior right lower lobe with surrounding ground-glass opacities. Differential includes focal pneumonia versus neoplasm. Given history of fever, pneumonia is favored, however close clinical follow-up is recommended. 07/23/16 Office visit. Continue Anoro. Follow up in 6-8 weeks with repeat CT chest 09/06/16 CT chest: significant partial clearing of apparent pneumonia from the right lower lobe, posterior segment. A smaller area of irregular opacity remains in the posterior segment right lower lobe  INTERVAL: No major events  SUBJ: This is a routine reevaluation. She has no new complaints. She is working on weight loss and weight is down 8 lbs. Overall no new complaints and no change in (mild) DOE. Denies CP, fever, purulent sputum, hemoptysis, LE edema and calf tenderness.  Vitals:   12/22/16 1023  BP: 132/78  Pulse: 64  SpO2: 96%  Weight: 234 lb (106.1 kg)  Height: 5\' 4"  (1.626 m)     EXAM:   Gen: Obese, no distress HEENT: NCAT, WNL Neck: Supple without LAN, thyromegaly, JVD Lungs: breath sounds full, No wheezes Cardiovascular: Normal rate, reg rhythm, no murmurs noted Abdomen: Soft, nontender, normal BS Ext: without clubbing, cyanosis. Trace symmetric ankle edema Neuro: grossly intact   DATA:  CXR: Improved RLL aeration PFTs: no new study  IMPRESSION:     ICD-10-CM   1. Opacity of lung on imaging study R91.8   2. Moderate to severe obesity E66.09    Continued improvement in RLL - no further evaluation is warranted  PLAN:  Chest x-ray ordered for today - reviewed above. We will call her with results Continue efforts at weight loss Follow up in 3 months with target weight of 225#  Merton Border, MD PCCM service Mobile [phone] Pager [phone] 12/22/2016

## 2016-12-22 NOTE — Patient Instructions (Addendum)
Chest x-ray ordered for today - we will call with results Continue efforts at weight loss Follow up in 3 months with target weight of 225#

## 2016-12-23 ENCOUNTER — Telehealth: Payer: Self-pay | Admitting: *Deleted

## 2016-12-23 NOTE — Telephone Encounter (Signed)
Pt informed of results. Nothing further needed. 

## 2016-12-23 NOTE — Telephone Encounter (Signed)
-----   Message from Wilhelmina Mcardle, MD sent at 12/22/2016  3:48 PM EDT ----- Let her know that CXR looks good - no worrisome findings  Thanks  Waunita Schooner

## 2017-01-06 ENCOUNTER — Other Ambulatory Visit: Payer: Self-pay | Admitting: Family Medicine

## 2017-01-06 DIAGNOSIS — Z1231 Encounter for screening mammogram for malignant neoplasm of breast: Secondary | ICD-10-CM

## 2017-01-11 DIAGNOSIS — M47817 Spondylosis without myelopathy or radiculopathy, lumbosacral region: Secondary | ICD-10-CM | POA: Insufficient documentation

## 2017-01-19 DIAGNOSIS — R0602 Shortness of breath: Secondary | ICD-10-CM | POA: Insufficient documentation

## 2017-01-24 ENCOUNTER — Ambulatory Visit
Admission: RE | Admit: 2017-01-24 | Discharge: 2017-01-24 | Disposition: A | Discharge status: Home / Self Care | Payer: BLUE CROSS/BLUE SHIELD | Source: Ambulatory Visit | Attending: Family Medicine | Admitting: Family Medicine

## 2017-01-24 DIAGNOSIS — Z1231 Encounter for screening mammogram for malignant neoplasm of breast: Secondary | ICD-10-CM

## 2017-01-26 ENCOUNTER — Other Ambulatory Visit: Payer: Self-pay | Admitting: Family Medicine

## 2017-01-26 DIAGNOSIS — N6320 Unspecified lump in the left breast, unspecified quadrant: Secondary | ICD-10-CM

## 2017-01-26 DIAGNOSIS — R928 Other abnormal and inconclusive findings on diagnostic imaging of breast: Secondary | ICD-10-CM

## 2017-01-28 ENCOUNTER — Encounter: Payer: Self-pay | Admitting: *Deleted

## 2017-01-31 ENCOUNTER — Encounter
Admission: RE | Disposition: A | Discharge status: Home / Self Care | Payer: Self-pay | Source: Ambulatory Visit | Attending: Unknown Physician Specialty

## 2017-01-31 ENCOUNTER — Ambulatory Visit
Admission: RE | Admit: 2017-01-31 | Discharge: 2017-01-31 | Disposition: A | Discharge status: Home / Self Care | Payer: BLUE CROSS/BLUE SHIELD | Source: Ambulatory Visit | Attending: Unknown Physician Specialty | Admitting: Unknown Physician Specialty

## 2017-01-31 ENCOUNTER — Encounter: Payer: Self-pay | Admitting: *Deleted

## 2017-01-31 DIAGNOSIS — Z96659 Presence of unspecified artificial knee joint: Secondary | ICD-10-CM | POA: Insufficient documentation

## 2017-01-31 DIAGNOSIS — M1990 Unspecified osteoarthritis, unspecified site: Secondary | ICD-10-CM | POA: Insufficient documentation

## 2017-01-31 DIAGNOSIS — Z86010 Personal history of colonic polyps: Secondary | ICD-10-CM | POA: Diagnosis not present

## 2017-01-31 DIAGNOSIS — F419 Anxiety disorder, unspecified: Secondary | ICD-10-CM | POA: Diagnosis not present

## 2017-01-31 DIAGNOSIS — Z1211 Encounter for screening for malignant neoplasm of colon: Secondary | ICD-10-CM | POA: Insufficient documentation

## 2017-01-31 DIAGNOSIS — M797 Fibromyalgia: Secondary | ICD-10-CM | POA: Diagnosis not present

## 2017-01-31 DIAGNOSIS — Z8719 Personal history of other diseases of the digestive system: Secondary | ICD-10-CM | POA: Insufficient documentation

## 2017-01-31 DIAGNOSIS — E039 Hypothyroidism, unspecified: Secondary | ICD-10-CM | POA: Insufficient documentation

## 2017-01-31 DIAGNOSIS — Z885 Allergy status to narcotic agent status: Secondary | ICD-10-CM | POA: Diagnosis not present

## 2017-01-31 DIAGNOSIS — I10 Essential (primary) hypertension: Secondary | ICD-10-CM | POA: Insufficient documentation

## 2017-01-31 DIAGNOSIS — Z79899 Other long term (current) drug therapy: Secondary | ICD-10-CM | POA: Insufficient documentation

## 2017-01-31 DIAGNOSIS — Z981 Arthrodesis status: Secondary | ICD-10-CM | POA: Insufficient documentation

## 2017-01-31 DIAGNOSIS — K648 Other hemorrhoids: Secondary | ICD-10-CM | POA: Insufficient documentation

## 2017-01-31 DIAGNOSIS — Z880 Allergy status to penicillin: Secondary | ICD-10-CM | POA: Diagnosis not present

## 2017-01-31 DIAGNOSIS — K219 Gastro-esophageal reflux disease without esophagitis: Secondary | ICD-10-CM | POA: Diagnosis not present

## 2017-01-31 DIAGNOSIS — J45909 Unspecified asthma, uncomplicated: Secondary | ICD-10-CM | POA: Insufficient documentation

## 2017-01-31 DIAGNOSIS — E785 Hyperlipidemia, unspecified: Secondary | ICD-10-CM | POA: Insufficient documentation

## 2017-01-31 DIAGNOSIS — G250 Essential tremor: Secondary | ICD-10-CM | POA: Insufficient documentation

## 2017-01-31 DIAGNOSIS — Z91048 Other nonmedicinal substance allergy status: Secondary | ICD-10-CM | POA: Insufficient documentation

## 2017-01-31 DIAGNOSIS — F329 Major depressive disorder, single episode, unspecified: Secondary | ICD-10-CM | POA: Insufficient documentation

## 2017-01-31 HISTORY — PX: FLEXIBLE SIGMOIDOSCOPY: SHX5431

## 2017-01-31 LAB — GLUCOSE, CAPILLARY: Glucose-Capillary: 105 mg/dL — ABNORMAL HIGH (ref 65–99)

## 2017-01-31 SURGERY — SIGMOIDOSCOPY, FLEXIBLE
Anesthesia: General

## 2017-01-31 MED ORDER — SODIUM CHLORIDE 0.9 % IV SOLN: INTRAVENOUS | Status: DC

## 2017-01-31 NOTE — H&P (Signed)
Primary Care Physician:  Maryland Pink, MD Primary Gastroenterologist:  Dr. Vira Agar  Pre-Procedure History & Physical: HPI:  [name] is a 61 y.o. female is here for an flexible sigmoidoscopy.   Past Medical History:  Diagnosis Date  . Anemia   . Anxiety   . Arthritis   . Asthma   . Benign essential tremor   . Claustrophobia   . Depression   . Dysrhythmia   . Fibromyalgia   . Fusion of spine of cervical region   . Gastric ulcer   . GERD (gastroesophageal reflux disease)   . Headache   . Hemorrhoids   . History of chicken pox   . History of motion sickness   . History of pleurisy   . History of seasonal allergies   . Hyperlipidemia   . Hypertension   . Hypothyroidism   . Leukocytosis   . PONV (postoperative nausea and vomiting)   . Radiculopathy of cervical region     Past Surgical History:  Procedure Laterality Date  . ALLOGRAFT APPLICATION    . BREAST BIOPSY Bilateral 1970's  . BREAST BIOPSY Left    stereotactic  . CESAREAN SECTION    . COLONOSCOPY WITH PROPOFOL N/A 04/14/2016   Procedure: COLONOSCOPY WITH PROPOFOL;  Surgeon: Manya Silvas, MD;  Location: [facility] ENDOSCOPY;  Service: Endoscopy;  Laterality: N/A;  . ESOPHAGOGASTRODUODENOSCOPY (EGD) WITH PROPOFOL N/A 04/14/2016   Procedure: ESOPHAGOGASTRODUODENOSCOPY (EGD) WITH PROPOFOL;  Surgeon: Manya Silvas, MD;  Location: [facility] ENDOSCOPY;  Service: Endoscopy;  Laterality: N/A;  . FLEXIBLE SIGMOIDOSCOPY N/A 11/03/2016   Procedure: FLEXIBLE SIGMOIDOSCOPY;  Surgeon: Manya Silvas, MD;  Location: [facility] ENDOSCOPY;  Service: Endoscopy;  Laterality: N/A;  . gastric ulcer    . JOINT REPLACEMENT    . LEG TENDON SURGERY    . SHOULDER SURGERY    . TONSILLECTOMY    . TOTAL KNEE ARTHROPLASTY      Prior to Admission medications   Medication Sig Start Date End Date Taking? Authorizing Provider  acetaminophen (TYLENOL) 500 MG tablet Take 1,000 mg by mouth every 6 (six) hours as needed for mild pain or fever.     Yes [provider]  albuterol (VENTOLIN HFA) 108 (90 Base) MCG/ACT inhaler Inhale 2 puffs into the lungs every 6 (six) hours as needed for wheezing or shortness of breath.  05/01/14  Yes [provider]  cyanocobalamin (,VITAMIN B-12,) 1000 MCG/ML injection Inject 1,000 mcg into the muscle every 30 (thirty) days.   Yes [provider]  diltiazem (CARDIZEM CD) 180 MG 24 hr capsule Take 180 mg by mouth daily.  09/24/15  Yes [provider]  diltiazem (CARDIZEM) 120 MG tablet Take 120 mg by mouth daily.   Yes [provider]  fenofibrate 160 MG tablet Take 160 mg by mouth daily. 04/15/16  Yes [provider]  glimepiride (AMARYL) 1 MG tablet Take 1 mg by mouth daily with breakfast.  02/24/15  Yes [provider]  levothyroxine (SYNTHROID, LEVOTHROID) 50 MCG tablet Take 50 mcg by mouth daily before breakfast.  11/25/15  Yes [provider]  pantoprazole (PROTONIX) 40 MG tablet Take 40 mg by mouth daily.  04/01/15  Yes [provider]  glucose blood (ONE TOUCH ULTRA TEST) test strip 1 each by Other route as needed for other.  07/29/15   [provider]  lisinopril (PRINIVIL,ZESTRIL) 20 MG tablet Take 20 mg by mouth daily.  04/01/15   [provider]  sertraline (ZOLOFT) 100  MG tablet Take 100 mg by mouth daily.    [provider]  Vitamin D, Ergocalciferol, (DRISDOL) 50000 units CAPS capsule Take 50,000 Units by mouth 2 (two) times a week.  01/02/15   [provider]    Allergies as of 01/27/2017 - Review Complete 12/22/2016  Allergen Reaction Noted  . Cat hair extract Other (See Comments) and Itching 02/28/2013  . Oxycodone Other (See Comments) 09/30/2015  . Zosyn [piperacillin sod-tazobactam so] Cough 11/03/2016    Family History  Problem Relation Age of Onset  . Breast cancer Mother 40    Social History   Social History  . Marital status: Married    Spouse name: N/A  . Number of  children: N/A  . Years of education: N/A   Occupational History  . Not on file.   Social History Main Topics  . Smoking status: Former Smoker    Packs/day: 2.00    Years: 35.00    Quit date: 07/01/2000  . Smokeless tobacco: Never Used  . Alcohol use No  . Drug use: No  . Sexual activity: Not on file   Other Topics Concern  . Not on file   Social History Narrative  . No narrative on file    Review of Systems: See HPI, otherwise negative ROS  Physical Exam: BP (!) 161/82   Pulse 73   Temp 98.8 F (37.1 C) (Tympanic)   Resp 16   Ht 5\' 4"  (1.626 m)   Wt 106.1 kg (234 lb)   SpO2 100%   BMI 40.17 kg/m  General:   Alert,  pleasant and cooperative in NAD Head:  Normocephalic and atraumatic. Neck:  Supple; no masses or thyromegaly. Lungs:  Clear throughout to auscultation.    Heart:  Regular rate and rhythm. Abdomen:  Soft, nontender and nondistended. Normal bowel sounds, without guarding, and without rebound.   Neurologic:  Alert and  oriented x4;  grossly normal neurologically.  Impression/Plan: [name] is here for an ERCP to be performed for exam for possible polyp return.  Risks, benefits, limitations, and alternatives regarding  ERCP have been reviewed with the patient.  Questions have been answered.  All parties agreeable.   Gaylyn Cheers, MD  01/31/2017, 2:36 PM

## 2017-01-31 NOTE — Op Note (Signed)
[facility] Gastroenterology Patient Name: [name] Procedure Date: 01/31/2017 2:39 PM MRN: [id number] Account #: [id number] Date of Birth: 21-Dec-1955 Admit Type: Outpatient Age: 61 Room: [facility] ENDO ROOM 3 Gender: Female Note Status: Finalized Procedure:            Flexible Sigmoidoscopy Indications:          High risk colon cancer surveillance: Personal history                        of colonic polyps Providers:            Manya Silvas, MD Referring MD:         Irven Easterly. Kary Kos, MD (Referring MD) Medicines:            None Complications:        No immediate complications. Procedure:            Pre-Anesthesia Assessment:                       - After reviewing the risks and benefits, the patient                        was deemed in satisfactory condition to undergo the                        procedure.                       After obtaining informed consent, the scope was passed                        under direct vision. The Colonoscope was introduced                        through the anus and advanced to the the sigmoid colon.                        The flexible sigmoidoscopy was accomplished without                        difficulty. The patient tolerated the procedure well.                        The quality of the bowel preparation was good. Findings:      The entire examined colon appeared normal. Distal sigmoid and rectum.       Small internal hemorrhoids. Impression:           - The entire examined colon is normal.                       - No specimens collected. Recommendation:       - Repeat colonoscopy in 3 years for adenoma                        surveillance. Manya Silvas, MD 01/31/2017 2:55:29 PM This report has been signed electronically. Number of Addenda: 0 Note Initiated On: 01/31/2017 2:39 PM      [facility]

## 2017-02-01 ENCOUNTER — Encounter: Payer: Self-pay | Admitting: Unknown Physician Specialty

## 2017-02-04 ENCOUNTER — Ambulatory Visit
Admission: RE | Admit: 2017-02-04 | Discharge: 2017-02-04 | Disposition: A | Discharge status: Home / Self Care | Payer: BLUE CROSS/BLUE SHIELD | Source: Ambulatory Visit | Attending: Family Medicine | Admitting: Family Medicine

## 2017-02-04 DIAGNOSIS — N6002 Solitary cyst of left breast: Secondary | ICD-10-CM | POA: Insufficient documentation

## 2017-02-04 DIAGNOSIS — N6320 Unspecified lump in the left breast, unspecified quadrant: Secondary | ICD-10-CM

## 2017-02-04 DIAGNOSIS — R928 Other abnormal and inconclusive findings on diagnostic imaging of breast: Secondary | ICD-10-CM

## 2017-02-16 DIAGNOSIS — I5189 Other ill-defined heart diseases: Secondary | ICD-10-CM

## 2017-02-16 HISTORY — DX: Other ill-defined heart diseases: I51.89

## 2017-03-10 ENCOUNTER — Ambulatory Visit: Payer: Self-pay | Admitting: Obstetrics and Gynecology

## 2017-03-21 ENCOUNTER — Ambulatory Visit (INDEPENDENT_AMBULATORY_CARE_PROVIDER_SITE_OTHER): Payer: BLUE CROSS/BLUE SHIELD | Admitting: Pulmonary Disease

## 2017-03-21 ENCOUNTER — Encounter: Payer: Self-pay | Admitting: Pulmonary Disease

## 2017-03-21 VITALS — BP 140/74 | HR 72 | Resp 16 | Ht 64.0 in | Wt 231.0 lb

## 2017-03-21 DIAGNOSIS — R0683 Snoring: Secondary | ICD-10-CM | POA: Insufficient documentation

## 2017-03-21 DIAGNOSIS — E785 Hyperlipidemia, unspecified: Secondary | ICD-10-CM | POA: Insufficient documentation

## 2017-03-21 DIAGNOSIS — R0609 Other forms of dyspnea: Secondary | ICD-10-CM | POA: Diagnosis not present

## 2017-03-21 DIAGNOSIS — M1990 Unspecified osteoarthritis, unspecified site: Secondary | ICD-10-CM | POA: Insufficient documentation

## 2017-03-21 DIAGNOSIS — F40240 Claustrophobia: Secondary | ICD-10-CM | POA: Insufficient documentation

## 2017-03-21 DIAGNOSIS — R112 Nausea with vomiting, unspecified: Secondary | ICD-10-CM | POA: Insufficient documentation

## 2017-03-21 DIAGNOSIS — Z87898 Personal history of other specified conditions: Secondary | ICD-10-CM | POA: Insufficient documentation

## 2017-03-21 DIAGNOSIS — R002 Palpitations: Secondary | ICD-10-CM | POA: Insufficient documentation

## 2017-03-21 DIAGNOSIS — Z9889 Other specified postprocedural states: Secondary | ICD-10-CM

## 2017-03-21 DIAGNOSIS — J449 Chronic obstructive pulmonary disease, unspecified: Secondary | ICD-10-CM | POA: Diagnosis not present

## 2017-03-21 DIAGNOSIS — J45909 Unspecified asthma, uncomplicated: Secondary | ICD-10-CM | POA: Insufficient documentation

## 2017-03-21 DIAGNOSIS — I10 Essential (primary) hypertension: Secondary | ICD-10-CM | POA: Insufficient documentation

## 2017-03-21 DIAGNOSIS — F329 Major depressive disorder, single episode, unspecified: Secondary | ICD-10-CM | POA: Insufficient documentation

## 2017-03-21 DIAGNOSIS — E039 Hypothyroidism, unspecified: Secondary | ICD-10-CM | POA: Insufficient documentation

## 2017-03-21 DIAGNOSIS — D649 Anemia, unspecified: Secondary | ICD-10-CM | POA: Insufficient documentation

## 2017-03-21 DIAGNOSIS — F32A Depression, unspecified: Secondary | ICD-10-CM | POA: Insufficient documentation

## 2017-03-21 DIAGNOSIS — Z8711 Personal history of peptic ulcer disease: Secondary | ICD-10-CM | POA: Insufficient documentation

## 2017-03-21 DIAGNOSIS — G250 Essential tremor: Secondary | ICD-10-CM | POA: Insufficient documentation

## 2017-03-21 DIAGNOSIS — Z8719 Personal history of other diseases of the digestive system: Secondary | ICD-10-CM | POA: Insufficient documentation

## 2017-03-21 DIAGNOSIS — M797 Fibromyalgia: Secondary | ICD-10-CM | POA: Insufficient documentation

## 2017-03-21 DIAGNOSIS — K219 Gastro-esophageal reflux disease without esophagitis: Secondary | ICD-10-CM | POA: Insufficient documentation

## 2017-03-21 DIAGNOSIS — F419 Anxiety disorder, unspecified: Secondary | ICD-10-CM

## 2017-03-21 DIAGNOSIS — D72829 Elevated white blood cell count, unspecified: Secondary | ICD-10-CM | POA: Insufficient documentation

## 2017-03-21 DIAGNOSIS — J302 Other seasonal allergic rhinitis: Secondary | ICD-10-CM | POA: Insufficient documentation

## 2017-03-21 MED ORDER — FLUTICASONE-SALMETEROL 115-21 MCG/ACT IN AERO: 2.0000 | INHALATION_SPRAY | Freq: Two times a day (BID) | RESPIRATORY_TRACT | 12 refills | Status: DC

## 2017-03-21 NOTE — Patient Instructions (Addendum)
Begin Advair inhaler - 2 inhalations twice a day. Rinse mouth after use. Prescription has been sent to pharmacy  Follow-up in 3-4 months

## 2017-03-22 NOTE — Progress Notes (Signed)
PULMONARY OFFICE FOLLOW UP NOTE  Requesting MD/Service: Maryland Pink, MD Date of initial consultation: 09/30/15 Reason for consultation:  Dyspnea  PT PROFILE: 61 y.o. F former smoker of approx 30 p-y (quit 2002) referred for evaluation of exertional dyspnea. 09/30/15: Initial eval. Trial of Anoro inhaler. CXR and PFTs ordered. 10/30/15: Routine ROV: Anoro modestly beneficial. Still with mod-severe dyspnea on exertion. Complained of CP - nonexertional, positional, deemed likely due to GERD. CXR reviewed: cardiomegaly with interstitial prominence suggestive of CHF. PFTs revealed no obstruction, mild restriction, normal DLCO. PLAN: continue Anoro; check CBC (normal), BNP (normal); change PPI to dinner time; ROV 4-6 weeks  12/02/15: Improved DOE on Anoro. No new complaints. Discussed weight loss strategies. ROV 3 months with target weight of 225# 03/04/16 No new respiratory complaints. Weight is up 19#. Discussed weight loss strategies in detail CT chest 07/13/16:  Rounded masslike opacity in the posterior right lower lobe with surrounding ground-glass opacities. Differential includes focal pneumonia versus neoplasm. Given history of fever, pneumonia is favored, however close clinical follow-up is recommended. 07/23/16 Office visit. Continue Anoro. Follow up in 6-8 weeks with repeat CT chest 09/06/16 CT chest: significant partial clearing of apparent pneumonia from the right lower lobe, posterior segment. A smaller area of irregular opacity remains in the posterior segment right lower lobe  INTERVAL: No major events  SUBJ: This is a routine reevaluation. However, last couple of weeks, she has noted increasing wheezing and cough. She is no longer on Anoro though she previously thought it was beneficial. She had difficulty affording it. She uses albuterol MDI a couple of times per week. . She continues to work on weight loss and weight is down 3 more lbs.. Denies CP, fever, purulent sputum,  hemoptysis, LE edema and calf tenderness.  Vitals:   03/21/17 1104 03/21/17 1112  BP:  140/74  Pulse:  72  Resp: 16   SpO2:  97%  Weight: 104.8 kg (231 lb)   Height: 5\' 4"  (1.626 m)      EXAM:  Gen: Obese, no distress HEENT: NCAT, WNL Neck: Supple without LAN, thyromegaly, JVD Lungs: breath sounds mildly diminished, no wheezes Cardiovascular: RRR, no murmurs  Abdomen: Soft, nontender, normal BS Ext: without clubbing, cyanosis. No ankle edema Neuro: grossly intact   DATA:  CXR: NNF PFTs: no new study  IMPRESSION:     ICD-10-CM   1. DOE (dyspnea on exertion) R06.09   2. Chronic asthmatic bronchitis (HCC) J44.9     PLAN:  Begin Advair inhaler - 2 inhalations twice a day. Rinse mouth after use. Prescription has been sent to pharmacy Continue albuterol MDI as needed Continue efforts at weight loss Follow up in 3-4 months   Merton Border, MD PCCM service Mobile [phone] Pager [phone] 03/22/2017 8:32 PM

## 2017-04-14 ENCOUNTER — Encounter: Payer: Self-pay | Admitting: Obstetrics and Gynecology

## 2017-04-14 ENCOUNTER — Ambulatory Visit (INDEPENDENT_AMBULATORY_CARE_PROVIDER_SITE_OTHER): Payer: BLUE CROSS/BLUE SHIELD | Admitting: Obstetrics and Gynecology

## 2017-04-14 VITALS — BP 140/90 | HR 82 | Ht 63.0 in | Wt 238.0 lb

## 2017-04-14 DIAGNOSIS — Z124 Encounter for screening for malignant neoplasm of cervix: Secondary | ICD-10-CM

## 2017-04-14 DIAGNOSIS — Z01419 Encounter for gynecological examination (general) (routine) without abnormal findings: Secondary | ICD-10-CM | POA: Diagnosis not present

## 2017-04-14 DIAGNOSIS — Z1231 Encounter for screening mammogram for malignant neoplasm of breast: Secondary | ICD-10-CM

## 2017-04-14 DIAGNOSIS — Z1239 Encounter for other screening for malignant neoplasm of breast: Secondary | ICD-10-CM

## 2017-04-14 DIAGNOSIS — Z1151 Encounter for screening for human papillomavirus (HPV): Secondary | ICD-10-CM

## 2017-04-14 NOTE — Progress Notes (Signed)
PCP: Maryland Pink, MD   Chief Complaint  Patient presents with  . Gynecologic Exam    HPI:      [name] is a 61 y.o. G1P1001 who LMP was No LMP recorded. Patient is postmenopausal., presents today for her NP annual examination.  Her menses are absent due to menopause. She does not have intermenstrual bleeding.  She does have vasomotor sx.   Sex activity: single partner, contraception - status post hysterectomy. She does have vaginal dryness. Uses lubricants with relief.   Last Pap: not recent, no hx of abnormal paps. Hx of STDs: none  Last mammogram: February 04, 2017  Results were: normal--routine follow-up in 12 months There is a FH of breast cancer in her mom, genetic testing not indicated. There is no FH of ovarian cancer. The patient does do self-breast exams.  Colonoscopy: colonoscopy this yr with Pre-cancerous polyps. Repeat due after 2 years.   Tobacco use: The patient denies current or previous tobacco use. Alcohol use: none Exercise: not active  She does get adequate calcium and Vitamin D in her diet.  Labs with PCP.  Past Medical History:  Diagnosis Date  . Anemia   . Anxiety   . Arthritis   . Asthma   . Benign essential tremor   . Claustrophobia   . Depression   . Dysrhythmia   . Fibromyalgia   . Fusion of spine of cervical region   . Gastric ulcer   . GERD (gastroesophageal reflux disease)   . Headache   . Hemorrhoids   . History of chicken pox   . History of motion sickness   . History of pleurisy   . History of seasonal allergies   . Hyperlipidemia   . Hypertension   . Hypothyroidism   . Leukocytosis   . PONV (postoperative nausea and vomiting)   . Radiculopathy of cervical region     Past Surgical History:  Procedure Laterality Date  . ALLOGRAFT APPLICATION    . BREAST BIOPSY Bilateral 1970's  . BREAST BIOPSY Left    stereotactic  . CESAREAN SECTION    . COLONOSCOPY WITH PROPOFOL N/A 04/14/2016   Procedure: COLONOSCOPY WITH  PROPOFOL;  Surgeon: Manya Silvas, MD;  Location: [facility] ENDOSCOPY;  Service: Endoscopy;  Laterality: N/A;  . ESOPHAGOGASTRODUODENOSCOPY (EGD) WITH PROPOFOL N/A 04/14/2016   Procedure: ESOPHAGOGASTRODUODENOSCOPY (EGD) WITH PROPOFOL;  Surgeon: Manya Silvas, MD;  Location: [facility] ENDOSCOPY;  Service: Endoscopy;  Laterality: N/A;  . FLEXIBLE SIGMOIDOSCOPY N/A 11/03/2016   Procedure: FLEXIBLE SIGMOIDOSCOPY;  Surgeon: Manya Silvas, MD;  [facility] ENDOSCOPY;  Service: Endoscopy;  Laterality: N/A;  . FLEXIBLE SIGMOIDOSCOPY N/A 01/31/2017   Procedure: FLEXIBLE SIGMOIDOSCOPY;  Surgeon: Manya Silvas, MD;  Location: [facility] ENDOSCOPY;  Service: Endoscopy;  Laterality: N/A;  . gastric ulcer    . JOINT REPLACEMENT    . LEG TENDON SURGERY    . NERVE SURGERY     on back  . SHOULDER SURGERY    . TONSILLECTOMY    . TOTAL KNEE ARTHROPLASTY      Family History  Problem Relation Age of Onset  . Breast cancer Mother 57  . Hypertension Father   . Cancer Sister     Social History   Social History  . Marital status: Married    Spouse name: N/A  . Number of children: N/A  . Years of education: N/A   Occupational History  . Not on file.   Social History Main Topics  .  Smoking status: Former Smoker    Packs/day: 2.00    Years: 35.00    Quit date: 07/01/2000  . Smokeless tobacco: Never Used  . Alcohol use No  . Drug use: No  . Sexual activity: Yes   Other Topics Concern  . Not on file   Social History Narrative  . No narrative on file    Current Meds  Medication Sig  . acetaminophen (TYLENOL) 500 MG tablet Take 1,000 mg by mouth every 6 (six) hours as needed for mild pain or fever.   Marland Kitchen albuterol (VENTOLIN HFA) 108 (90 Base) MCG/ACT inhaler Inhale 2 puffs into the lungs every 6 (six) hours as needed for wheezing or shortness of breath.   . cyanocobalamin (,VITAMIN B-12,) 1000 MCG/ML injection Inject 1,000 mcg into the muscle every 30 (thirty) days.  Marland Kitchen diltiazem (CARDIZEM CD) 180  MG 24 hr capsule Take 180 mg by mouth daily.   . fenofibrate 160 MG tablet Take 160 mg by mouth daily.  . fluticasone-salmeterol (ADVAIR HFA) 115-21 MCG/ACT inhaler Inhale 2 puffs into the lungs 2 (two) times daily.  Marland Kitchen glimepiride (AMARYL) 1 MG tablet Take 1 mg by mouth daily with breakfast.   . levothyroxine (SYNTHROID, LEVOTHROID) 50 MCG tablet Take 50 mcg by mouth daily before breakfast.   . pantoprazole (PROTONIX) 40 MG tablet Take 40 mg by mouth daily.   . sertraline (ZOLOFT) 100 MG tablet Take 100 mg by mouth daily.  . traMADol (ULTRAM) 50 MG tablet Take 50 mg by mouth 3 times/day as needed-between meals & bedtime.  . Vitamin D, Ergocalciferol, (DRISDOL) 50000 units CAPS capsule Take 50,000 Units by mouth 2 (two) times a week.       ROS:  Review of Systems  Constitutional: Negative for fatigue, fever and unexpected weight change.  Respiratory: Negative for cough, shortness of breath and wheezing.   Cardiovascular: Negative for chest pain, palpitations and leg swelling.  Gastrointestinal: Negative for blood in stool, constipation, diarrhea, nausea and vomiting.  Endocrine: Negative for cold intolerance, heat intolerance and polyuria.  Genitourinary: Negative for dyspareunia, dysuria, flank pain, frequency, genital sores, hematuria, menstrual problem, pelvic pain, urgency, vaginal bleeding, vaginal discharge and vaginal pain.  Musculoskeletal: Negative for back pain, joint swelling and myalgias.  Skin: Negative for rash.  Neurological: Negative for dizziness, syncope, light-headedness, numbness and headaches.  Hematological: Negative for adenopathy.  Psychiatric/Behavioral: Negative for agitation, confusion, sleep disturbance and suicidal ideas. The patient is not nervous/anxious.      Objective: BP 140/90   Pulse 82   Ht 5\' 3"  (1.6 m)   Wt 238 lb (108 kg)   BMI 42.16 kg/m    Physical Exam  Constitutional: She is oriented to person, place, and time. She appears  well-developed and well-nourished.  Genitourinary: Vagina normal and uterus normal. There is no rash or tenderness on the right labia. There is no rash or tenderness on the left labia. No erythema or tenderness in the vagina. No vaginal discharge found. Right adnexum does not display mass and does not display tenderness. Left adnexum does not display mass and does not display tenderness. Cervix does not exhibit motion tenderness or polyp. Uterus is not enlarged or tender.  Neck: Normal range of motion. No thyromegaly present.  Cardiovascular: Normal rate, regular rhythm and normal heart sounds.   No murmur heard. Pulmonary/Chest: Effort normal and breath sounds normal. Right breast exhibits no mass, no nipple discharge, no skin change and no tenderness. Left breast exhibits no mass, no nipple  discharge, no skin change and no tenderness.  Abdominal: Soft. There is no tenderness. There is no guarding.  Musculoskeletal: Normal range of motion.  Neurological: She is alert and oriented to person, place, and time. No cranial nerve deficit.  Psychiatric: She has a normal mood and affect. Her behavior is normal.  Vitals reviewed.   Assessment/Plan:  Encounter for annual routine gynecological examination  Cervical cancer screening - Plan: IGP, Aptima HPV  Screening for HPV (human papillomavirus) - Plan: IGP, Aptima HPV  Screening for breast cancer - Pt current on mammos.        GYN counsel breast self exam, mammography screening, menopause, adequate intake of calcium and vitamin D, diet and exercise    F/U  Return in about 1 year (around 04/14/2018).  Alicia B. Copland, PA-C 04/14/2017 11:25 AM

## 2017-04-16 LAB — IGP, APTIMA HPV
HPV Aptima: NEGATIVE
PAP Smear Comment: 0

## 2017-07-12 ENCOUNTER — Ambulatory Visit
Admission: RE | Admit: 2017-07-12 | Discharge: 2017-07-12 | Disposition: A | Discharge status: Home / Self Care | Payer: BLUE CROSS/BLUE SHIELD | Source: Ambulatory Visit | Attending: Urology | Admitting: Urology

## 2017-07-12 DIAGNOSIS — K760 Fatty (change of) liver, not elsewhere classified: Secondary | ICD-10-CM | POA: Diagnosis not present

## 2017-07-12 DIAGNOSIS — N2889 Other specified disorders of kidney and ureter: Secondary | ICD-10-CM | POA: Insufficient documentation

## 2017-07-12 DIAGNOSIS — N281 Cyst of kidney, acquired: Secondary | ICD-10-CM | POA: Diagnosis not present

## 2017-07-12 LAB — POCT I-STAT CREATININE: CREATININE: 0.7 mg/dL (ref 0.44–1.00)

## 2017-07-12 MED ORDER — GADOBENATE DIMEGLUMINE 529 MG/ML IV SOLN
20.0000 mL | Freq: Once | INTRAVENOUS | Status: AC | PRN
  Administered 2017-07-12: 20 mL via INTRAVENOUS

## 2017-07-15 ENCOUNTER — Encounter: Payer: Self-pay | Admitting: Urology

## 2017-07-15 ENCOUNTER — Ambulatory Visit: Payer: BLUE CROSS/BLUE SHIELD | Admitting: Urology

## 2017-07-15 VITALS — BP 135/84 | HR 71 | Wt 235.2 lb

## 2017-07-15 DIAGNOSIS — N281 Cyst of kidney, acquired: Secondary | ICD-10-CM | POA: Diagnosis not present

## 2017-07-15 NOTE — Progress Notes (Signed)
07/15/2017 10:23 AM   [name] April 29, 1956 [id number]  Referring provider: Maryland Pink, MD [address] [facility] [place], [place] [address]  Chief Complaint  Patient presents with  . Elevated PSA    [place] results    HPI: 61 year old female who returns the office today with follow-up MRI.  She was seen in the past on 06/2016 as an inpatient consult with left flank pain of unclear etiology.  As part of her workup, she underwent with no evidence of hydronephrosis, obstructing stones, or any stranding around her kidney to suggest the pain is renal in etiology. She did have an incidental 1.4 cm left lower pole peripelvic indeterminate lesion, further assessed with MRI which is limited secondary to motion artifact.   Today, she follows up with a repeat MRI performed on 07/12/2017.  This study showed no evidence of any suspicious renal lesions.  She did have bilateral Bosniak 1 renal cyst measuring up to 1.7 cm on the right.  No other GU pathology was identified.  Today, she reports that she has no significant urinary symptoms.  No dysuria or gross hematuria.  She does have occasional flank pain bilaterally of unclear etiology.  Her severe pain has resolved shortly after her visit.     PMH: Past Medical History:  Diagnosis Date  . Anemia   . Anxiety   . Arthritis   . Asthma   . Benign essential tremor   . Claustrophobia   . Depression   . Dysrhythmia   . Fibromyalgia   . Fusion of spine of cervical region   . Gastric ulcer   . GERD (gastroesophageal reflux disease)   . Headache   . Hemorrhoids   . History of chicken pox   . History of motion sickness   . History of pleurisy   . History of seasonal allergies   . Hyperlipidemia   . Hypertension   . Hypothyroidism   . Leukocytosis   . PONV (postoperative nausea and vomiting)   . Radiculopathy of cervical region     Surgical History: Past Surgical History:  Procedure Laterality Date  . ALLOGRAFT APPLICATION     . BREAST BIOPSY Bilateral 1970's  . BREAST BIOPSY Left    stereotactic  . CESAREAN SECTION    . COLONOSCOPY WITH PROPOFOL N/A 04/14/2016   Procedure: COLONOSCOPY WITH PROPOFOL;  Surgeon: Manya Silvas, MD;  Location: [facility] ENDOSCOPY;  Service: Endoscopy;  Laterality: N/A;  . ESOPHAGOGASTRODUODENOSCOPY (EGD) WITH PROPOFOL N/A 04/14/2016   Procedure: ESOPHAGOGASTRODUODENOSCOPY (EGD) WITH PROPOFOL;  Surgeon: Manya Silvas, MD;  Location[facility] END[facility]COPY;  Service: Endoscopy;  Laterality: N/A;  . FLEXIBLE SIGMOIDOSCOPY N/A 11/03/2016   Procedure: FLEXIBLE SIGMOIDOSCOPY;  Surgeon: Manya Silvas, MD;  Location: [facility] ENDOSCOPY;  Service: Endoscopy;  Laterality: N/A;  . FLEXIBLE SIGMOIDOSCOPY N/A 01/31/2017   Procedure: FLEXIBLE SIGMOIDOSCOPY;  Surgeon: Manya Silvas, MD;  Location: [facility] ENDOSCOPY;  Service: Endoscopy;  Laterality: N/A;  . gastric ulcer    . JOINT REPLACEMENT    . LEG TENDON SURGERY    . NERVE SURGERY     on back  . SHOULDER SURGERY    . TONSILLECTOMY    . TOTAL KNEE ARTHROPLASTY      Home Medications:  Allergies as of 07/15/2017      Reactions   Cat Hair Extract Other (See Comments), Itching   Congestion, watery eyes    Oxycodone Other (See Comments)   Withdrawal symptoms   Zosyn [piperacillin Sod-tazobactam So]  Cough      Medication List        Accurate as of 07/15/17 10:23 AM. Always use your most recent med list.          acetaminophen 500 MG tablet Commonly known as:  TYLENOL Take 1,000 mg by mouth every 6 (six) hours as needed for mild pain or fever.   cyanocobalamin 1000 MCG/ML injection Commonly known as:  (VITAMIN B-12) Inject 1,000 mcg into the muscle every 30 (thirty) days.   diltiazem 180 MG 24 hr capsule Commonly known as:  CARDIZEM CD Take 180 mg by mouth daily.   fenofibrate 160 MG tablet Take 160 mg by mouth daily.   fluticasone-salmeterol 115-21 MCG/ACT inhaler Commonly known as:  ADVAIR HFA Inhale 2 puffs into the lungs 2  (two) times daily.   glimepiride 1 MG tablet Commonly known as:  AMARYL Take 1 mg by mouth daily with breakfast.   levothyroxine 50 MCG tablet Commonly known as:  SYNTHROID, LEVOTHROID Take 50 mcg by mouth daily before breakfast.   lisinopril 20 MG tablet Commonly known as:  PRINIVIL,ZESTRIL Take 20 mg by mouth daily.   ONE TOUCH ULTRA TEST test strip Generic drug:  glucose blood 1 each by Other route as needed for other.   pantoprazole 40 MG tablet Commonly known as:  PROTONIX Take 40 mg by mouth daily.   sertraline 100 MG tablet Commonly known as:  ZOLOFT Take 100 mg by mouth daily.   triamcinolone cream 0.1 % Commonly known as:  KENALOG Apply 1 application topically 2 (two) times daily.   VENTOLIN HFA 108 (90 Base) MCG/ACT inhaler Generic drug:  albuterol Inhale 2 puffs into the lungs every 6 (six) hours as needed for wheezing or shortness of breath.   Vitamin D (Ergocalciferol) 50000 units Caps capsule Commonly known as:  DRISDOL Take 50,000 Units by mouth 2 (two) times a week.       Allergies:  Allergies  Allergen Reactions  . Cat Hair Extract Other (See Comments) and Itching    Congestion, watery eyes   . Oxycodone Other (See Comments)    Withdrawal symptoms  . Zosyn [Piperacillin Sod-Tazobactam So] Cough    Family History: Family History  Problem Relation Age of Onset  . Breast cancer Mother 50  . Hypertension Father   . Cancer Sister     Social History:  reports that she quit smoking about 17 years ago. She has a 70.00 pack-year smoking history. she has never used smokeless tobacco. She reports that she does not drink alcohol or use drugs.  ROS: UROLOGY Frequent Urination?: Yes Hard to postpone urination?: Yes Burning/pain with urination?: No Get up at night to urinate?: Yes Leakage of urine?: Yes Urine stream starts and stops?: No Trouble starting stream?: No Do you have to strain to urinate?: No Blood in urine?: No Urinary tract  infection?: No Sexually transmitted disease?: No Injury to kidneys or bladder?: No Painful intercourse?: No Weak stream?: No Currently pregnant?: No Vaginal bleeding?: No Last menstrual period?: n  Gastrointestinal Nausea?: No Vomiting?: No Indigestion/heartburn?: No Diarrhea?: No Constipation?: No  Constitutional Fever: No Night sweats?: No Weight loss?: No Fatigue?: No  Skin Skin rash/lesions?: No Itching?: No  Eyes Blurred vision?: No Double vision?: No  Ears/Nose/Throat Sore throat?: No Sinus problems?: Yes  Hematologic/Lymphatic Swollen glands?: No Easy bruising?: No  Cardiovascular Leg swelling?: No Chest pain?: No  Respiratory Cough?: No Shortness of breath?: Yes  Endocrine Excessive thirst?: Yes  Musculoskeletal Back pain?: Yes  Joint pain?: No  Neurological Headaches?: No Dizziness?: No  Psychologic Depression?: No Anxiety?: Yes  Physical Exam: BP 135/84 (BP Location: Left Arm, Patient Position: Sitting, Cuff Size: Large)   Pulse 71   Wt 235 lb 3.2 oz (106.7 kg)   BMI 41.66 kg/m   Constitutional:  Alert and oriented, No acute distress. HEENT: [place] AT, moist mucus membranes.  Trachea midline, no masses. Cardiovascular: No clubbing, cyanosis, or edema. Respiratory: Normal respiratory effort, no increased work of breathing. GI: Abdomen is soft, nontender, nondistended, no abdominal masses GU: No CVA tenderness.  Skin: No rashes, bruises or suspicious lesions. Neurologic: Grossly intact, no focal deficits, moving all 4 extremities. Psychiatric: Normal mood and affect.  Laboratory Data: Lab Results  Component Value Date   WBC 17.1 (H) 07/13/2016   HGB 11.1 (L) 07/13/2016   HCT 33.8 (L) 07/13/2016   MCV 79.8 (L) 07/13/2016   PLT 246 07/13/2016    Lab Results  Component Value Date   CREATININE 0.70 07/12/2017    Lab Results  Component Value Date   HGBA1C 6.6 (H) 07/12/2016    Urinalysis N/a  Pertinent  Imaging: CLINICAL DATA:  Left renal mass  EXAM: MRI ABDOMEN WITHOUT AND WITH CONTRAST  TECHNIQUE: Multiplanar multisequence MR imaging of the abdomen was performed both before and after the administration of intravenous contrast.  CONTRAST:  8mL MULTIHANCE GADOBENATE DIMEGLUMINE 529 MG/ML IV SOLN  COMPARISON:  MRI abdomen dated 07/12/2016. CT abdomen/pelvis dated 07/12/2016.  FINDINGS: Motion degraded images.  Lower chest: Lung bases are clear.  Hepatobiliary: Moderate to severe hepatic steatosis.  Gallbladder is unremarkable. No intrahepatic or extrahepatic ductal dilatation.  Pancreas:  Within normal limits.  Spleen:  Within normal limits.  Adrenals/Urinary Tract:  Adrenal glands are within normal limits.  Bilateral renal cysts, measuring 1.7 cm in the lateral right upper kidney (series 4/image 12) and 1.5 cm in the medial left lower kidney (series 4/image 18). No enhancing renal lesions. No hydronephrosis.  Stomach/Bowel: Stomach is within normal limits.  Visualized bowel is grossly unremarkable.  Vascular/Lymphatic:  No evidence of abdominal aortic aneurysm.  No suspicious abdominal lymphadenopathy.  Other:  No abdominal ascites.  Musculoskeletal: No focal osseous lesions.  IMPRESSION: Motion degraded images.  Bilateral renal cysts, measuring up to 1.7 cm on the right, benign (Bosniak I). No enhancing renal lesions.  Moderate to severe hepatic steatosis.   Electronically Signed   By: Julian Hy M.D.   On: 07/12/2017 13:00  MRI was personally reviewed today.  Assessment & Plan:    1. Renal cyst Previous indeterminate renal lesion imaging c/w benign Bosniak 1 renal cyst Results were reviewed with the patient No further urologic evaluation or surveillance indicated We did review incidental findings including moderate to severe fatty liver of which she was already aware and will address further with her PCP  F/u prn    Hollice Espy, MD  [facility] [address] [place],  [address] [phone]

## 2017-08-28 IMAGING — US US BREAST*L* LIMITED INC AXILLA
1 series · 4 of 4 positions shown · non-contrast
Comparison: Previous exam(s).

CLINICAL DATA: Screening recall for a possible left breast mass.

EXAM:
2D DIGITAL DIAGNOSTIC LEFT MAMMOGRAM WITH CAD AND ADJUNCT TOMO
ULTRASOUND LEFT BREAST

[Series 1: us breast*left* limited inc axilla · 0.06mm/px · 4 of 4 slices shown]
[im 1/4]
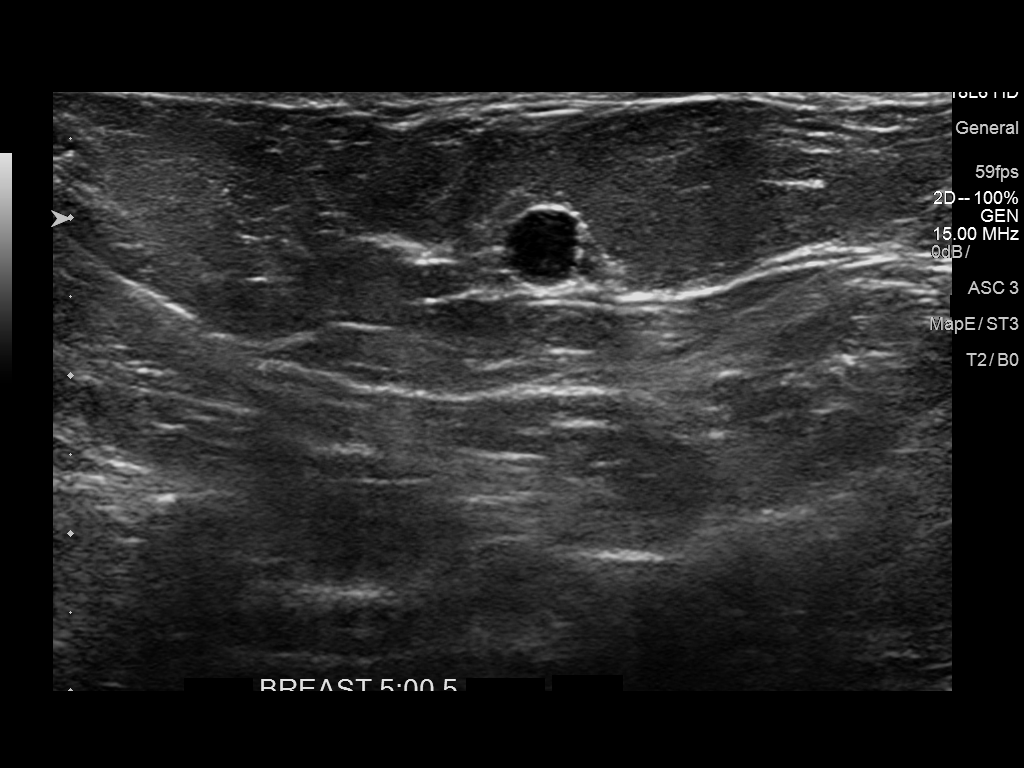
[im 2/4]
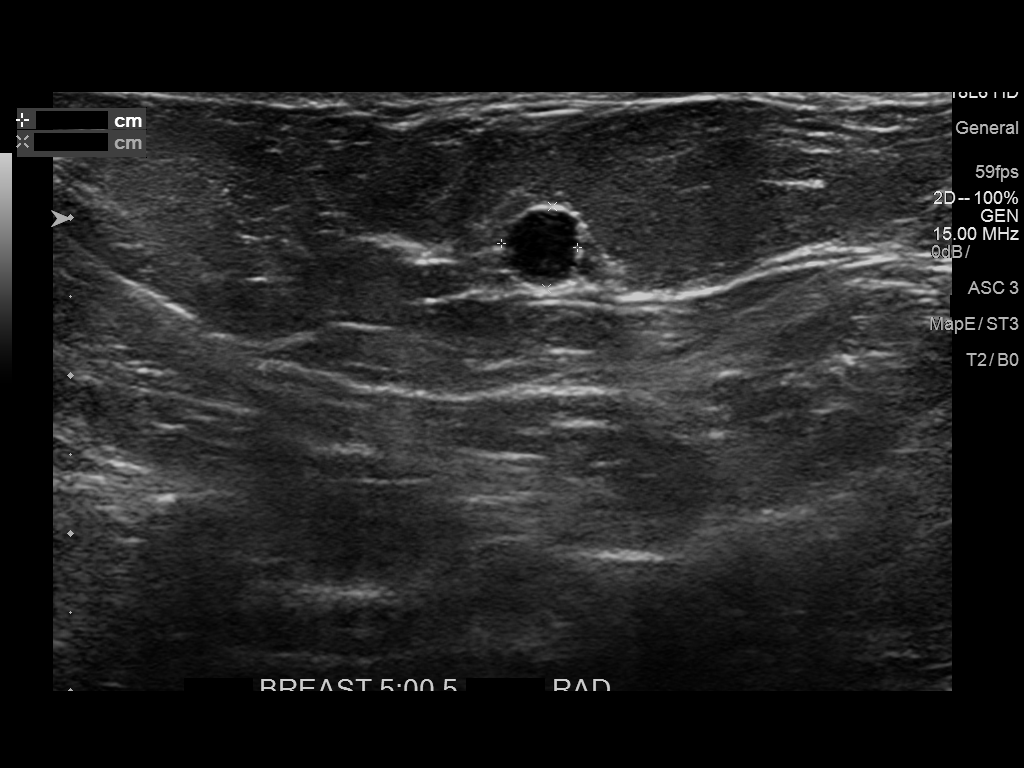
[im 3/4]
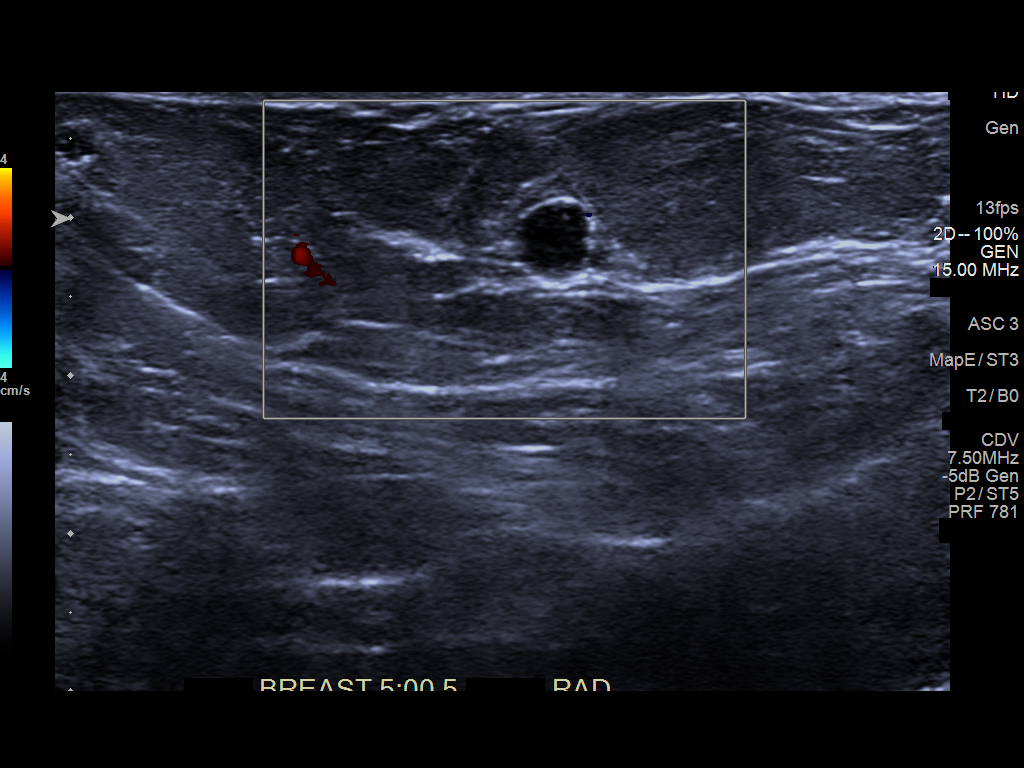
[im 4/4]
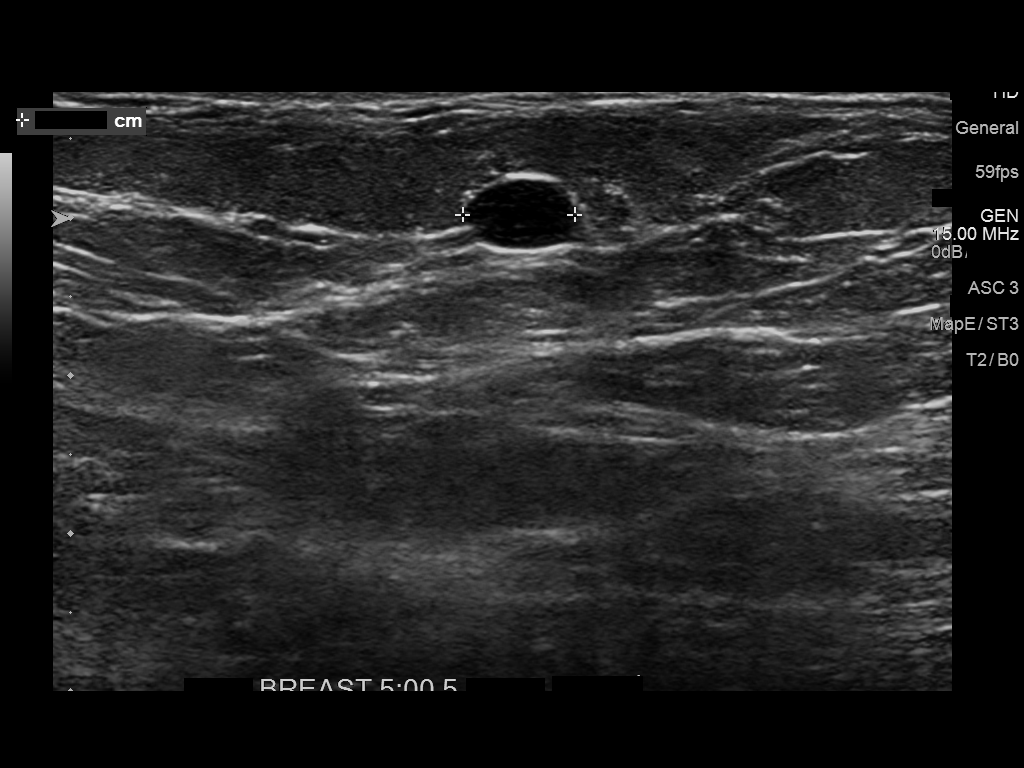

[4 of 4 positions shown; findings below may reference images not displayed]

ACR Breast Density Category b: There are scattered areas of
fibroglandular density.
FINDINGS: The possible mass noted in the inferior left breast persists on the
diagnostic images. It is oval with circumscribed margins. There are
several other circumscribed left breast masses, stable for multiple
years.

Mammographic images were processed with CAD.

Targeted ultrasound is performed, showing an oval simple cyst in the
left breast at 5 o'clock, 5 cm from the nipple, superficial depth,
measuring 7 x 5 x 5 mm, consistent in size, shape and location to
the mammographic finding. No solid masses or suspicious lesions.
IMPRESSION: 1. No evidence of malignancy.
2. Benign left breast cyst.

RECOMMENDATION:
Screening mammogram in one year.(Code:S1-C-BFP)

I have discussed the findings and recommendations with the patient.
Results were also provided in writing at the conclusion of the
visit. If applicable, a reminder letter will be sent to the patient
regarding the next appointment.

BI-RADS CATEGORY  2: Benign.

## 2018-03-13 ENCOUNTER — Ambulatory Visit: Payer: BLUE CROSS/BLUE SHIELD | Admitting: Pulmonary Disease

## 2018-03-13 ENCOUNTER — Encounter: Payer: Self-pay | Admitting: Pulmonary Disease

## 2018-03-13 VITALS — BP 132/78 | HR 68 | Ht 63.0 in | Wt 230.0 lb

## 2018-03-13 DIAGNOSIS — R0609 Other forms of dyspnea: Secondary | ICD-10-CM

## 2018-03-13 DIAGNOSIS — J449 Chronic obstructive pulmonary disease, unspecified: Secondary | ICD-10-CM | POA: Diagnosis not present

## 2018-03-13 NOTE — Patient Instructions (Signed)
Continue albuterol inhaler as needed for increased cough, wheezing, chest tightness, shortness of breath  If you are requiring albuterol more than 4 or 5 times per week, contact us and we can resume Advair as it was previously prescribed  Follow-up in 1 year.  Call sooner if needed

## 2018-03-13 NOTE — Progress Notes (Signed)
PULMONARY OFFICE FOLLOW UP NOTE  Requesting MD/Service: Maryland Pink, MD Date of initial consultation: 09/30/15 Reason for consultation:  Dyspnea  PT PROFILE: 62 y.o. F former smoker of approx 30 p-y (quit 2002) referred for evaluation of exertional dyspnea. 09/30/15: Initial eval. Trial of Anoro inhaler. CXR and PFTs ordered. 10/30/15: Routine ROV: Anoro modestly beneficial. Still with mod-severe dyspnea on exertion. Complained of CP - nonexertional, positional, deemed likely due to GERD. CXR reviewed: cardiomegaly with interstitial prominence suggestive of CHF. PFTs revealed no obstruction, mild restriction, normal DLCO. PLAN: continue Anoro; check CBC (normal), BNP (normal); change PPI to dinner time; ROV 4-6 weeks  12/02/15: Improved DOE on Anoro. No new complaints. Discussed weight loss strategies. ROV 3 months with target weight of 225# 03/04/16 No new respiratory complaints. Weight is up 19#. Discussed weight loss strategies in detail CT chest 07/13/16:  Rounded masslike opacity in the posterior right lower lobe with surrounding ground-glass opacities. Differential includes focal pneumonia versus neoplasm. Given history of fever, pneumonia is favored, however close clinical follow-up is recommended. 07/23/16 Office visit. Continue Anoro. Follow up in 6-8 weeks with repeat CT chest 09/06/16 CT chest: significant partial clearing of apparent pneumonia from the right lower lobe, posterior segment. A smaller area of irregular opacity remains in the posterior segment right lower lobe  INTERVAL: No major events  SUBJ: This is a belated scheduled follow-up.  She has had no major respiratory or pulmonary problems since last seen a year ago.  Unfortunately, her husband has had a number of health problems this year which is caused her a great deal of stress.  Overall, from a pulmonary point of view, she remains the same with no new complaints.  She denies CP, fever, purulent sputum, hemoptysis, LE  edema and calf tenderness.  She continues to have mild to moderate exertional dyspnea unchanged from previously.  She is no longer on Advair and notes no deterioration in respiratory symptoms since his continuation of this medication  Vitals:   03/13/18 1442 03/13/18 1446  BP:  132/78  Pulse:  68  SpO2:  96%  Weight: 230 lb (104.3 kg)   Height: 5\' 3"  (1.6 m)      EXAM:  Gen: NAD, moderate centripetal obesity HEENT: NCAT, sclera white Neck: No JVD Lungs: breath sounds mildly diminished, no wheezes or other adventitious sounds Cardiovascular: RRR, no murmurs Abdomen: Soft, nontender, normal BS Ext: without clubbing, cyanosis, edema Neuro: grossly intact Skin: Limited exam, no lesions noted    DATA:  BMP Latest Ref Rng & Units 07/12/2017 07/13/2016 07/12/2016  Glucose 65 - 99 mg/dL - 207(H) 162(H)  BUN 6 - 20 mg/dL - 16 22(H)  Creatinine 0.44 - 1.00 mg/dL 0.70 0.73 0.86  Sodium 135 - 145 mmol/L - 137 138  Potassium 3.5 - 5.1 mmol/L - 3.7 3.8  Chloride 101 - 111 mmol/L - 102 101  CO2 22 - 32 mmol/L - 27 29  Calcium 8.9 - 10.3 mg/dL - 8.7(L) 9.1   CBC Latest Ref Rng & Units 07/13/2016 07/12/2016 06/26/2016  WBC 3.6 - 11.0 K/uL 17.1(H) 21.5(H) 16.0(H)  Hemoglobin 12.0 - 16.0 g/dL 11.1(L) 13.1 13.0  Hematocrit 35.0 - 47.0 % 33.8(L) 39.6 40.3  Platelets 150 - 440 K/uL 246 293 285    CXR: NNF   IMPRESSION:     ICD-10-CM   1. Mild chronic asthmatic bronchitis (HCC) J44.9   2. Exertional dyspnea, mostly attributable to obesity R06.09     PLAN:  Continue albuterol inhaler as needed  for increased shortness of breath, cough, wheezing, chest tightness  If she requires albuterol more than 4-5 times per week, she is to contact us and we will resume Advair as previously prescribed  She is to continue to work on weight loss and reconditioning  Follow-up in 1 year.  Call sooner if needed  Merton Border, MD PCCM service Mobile [phone] Pager [phone] 03/14/2018 11:34  AM

## 2018-03-14 ENCOUNTER — Encounter: Payer: Self-pay | Admitting: Pulmonary Disease

## 2018-05-09 IMAGING — CR DG CHEST 2V
2 series · 2 of 2 positions shown · non-contrast
Comparison: The chest x-ray of July 12, 2016 and chest CT scan
of the same date and of September 06, 2016. Also PA and lateral chest
x-ray June 26, 2016.

CLINICAL DATA: History of right lower low pneumonia and a spot on
the lung on a chest x-ray [DATE] or [place]. Patient porch shortness
of breath. History of asthma and hypertension, former smoker.

EXAM:
CHEST  2 VIEW

[chest pa]
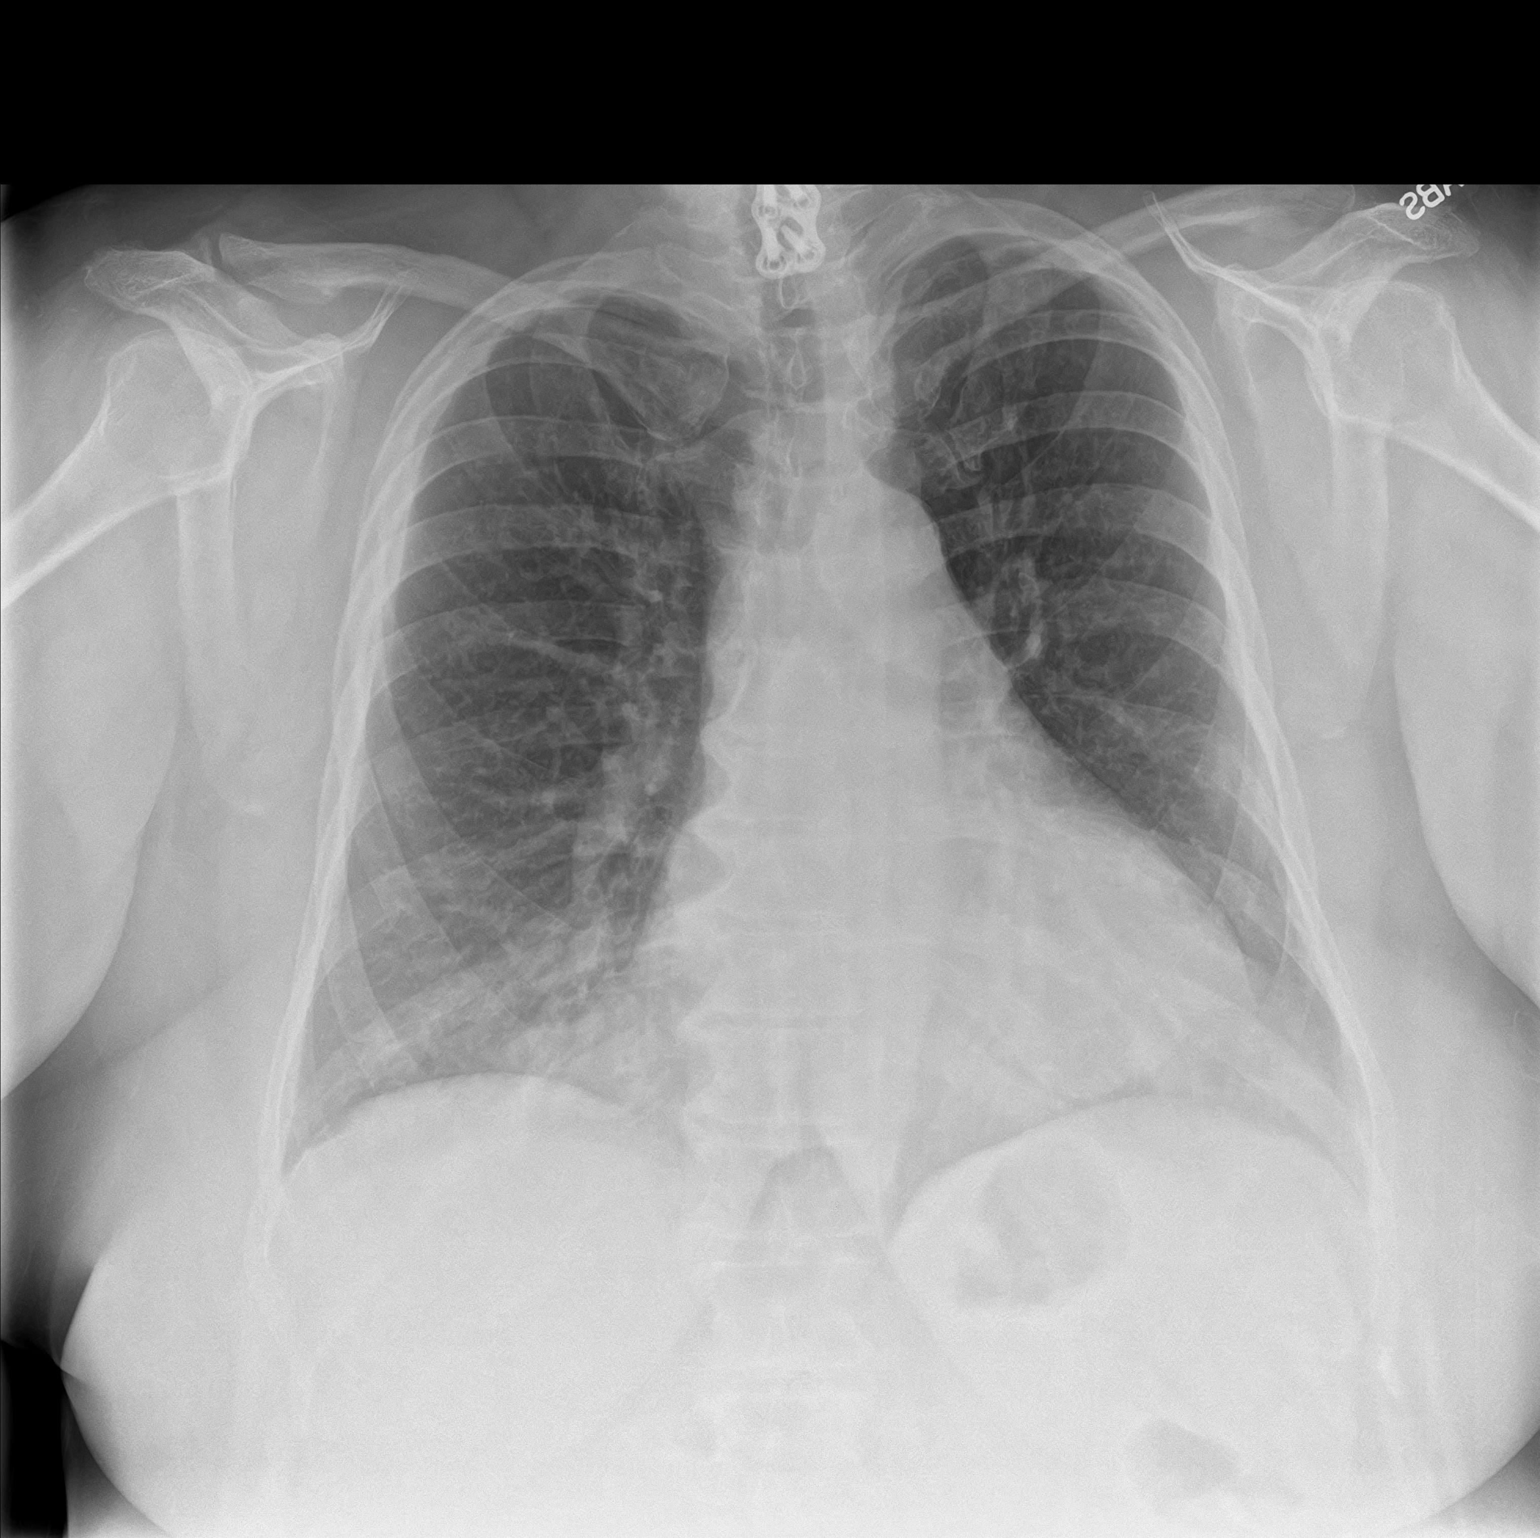

[chest lat]
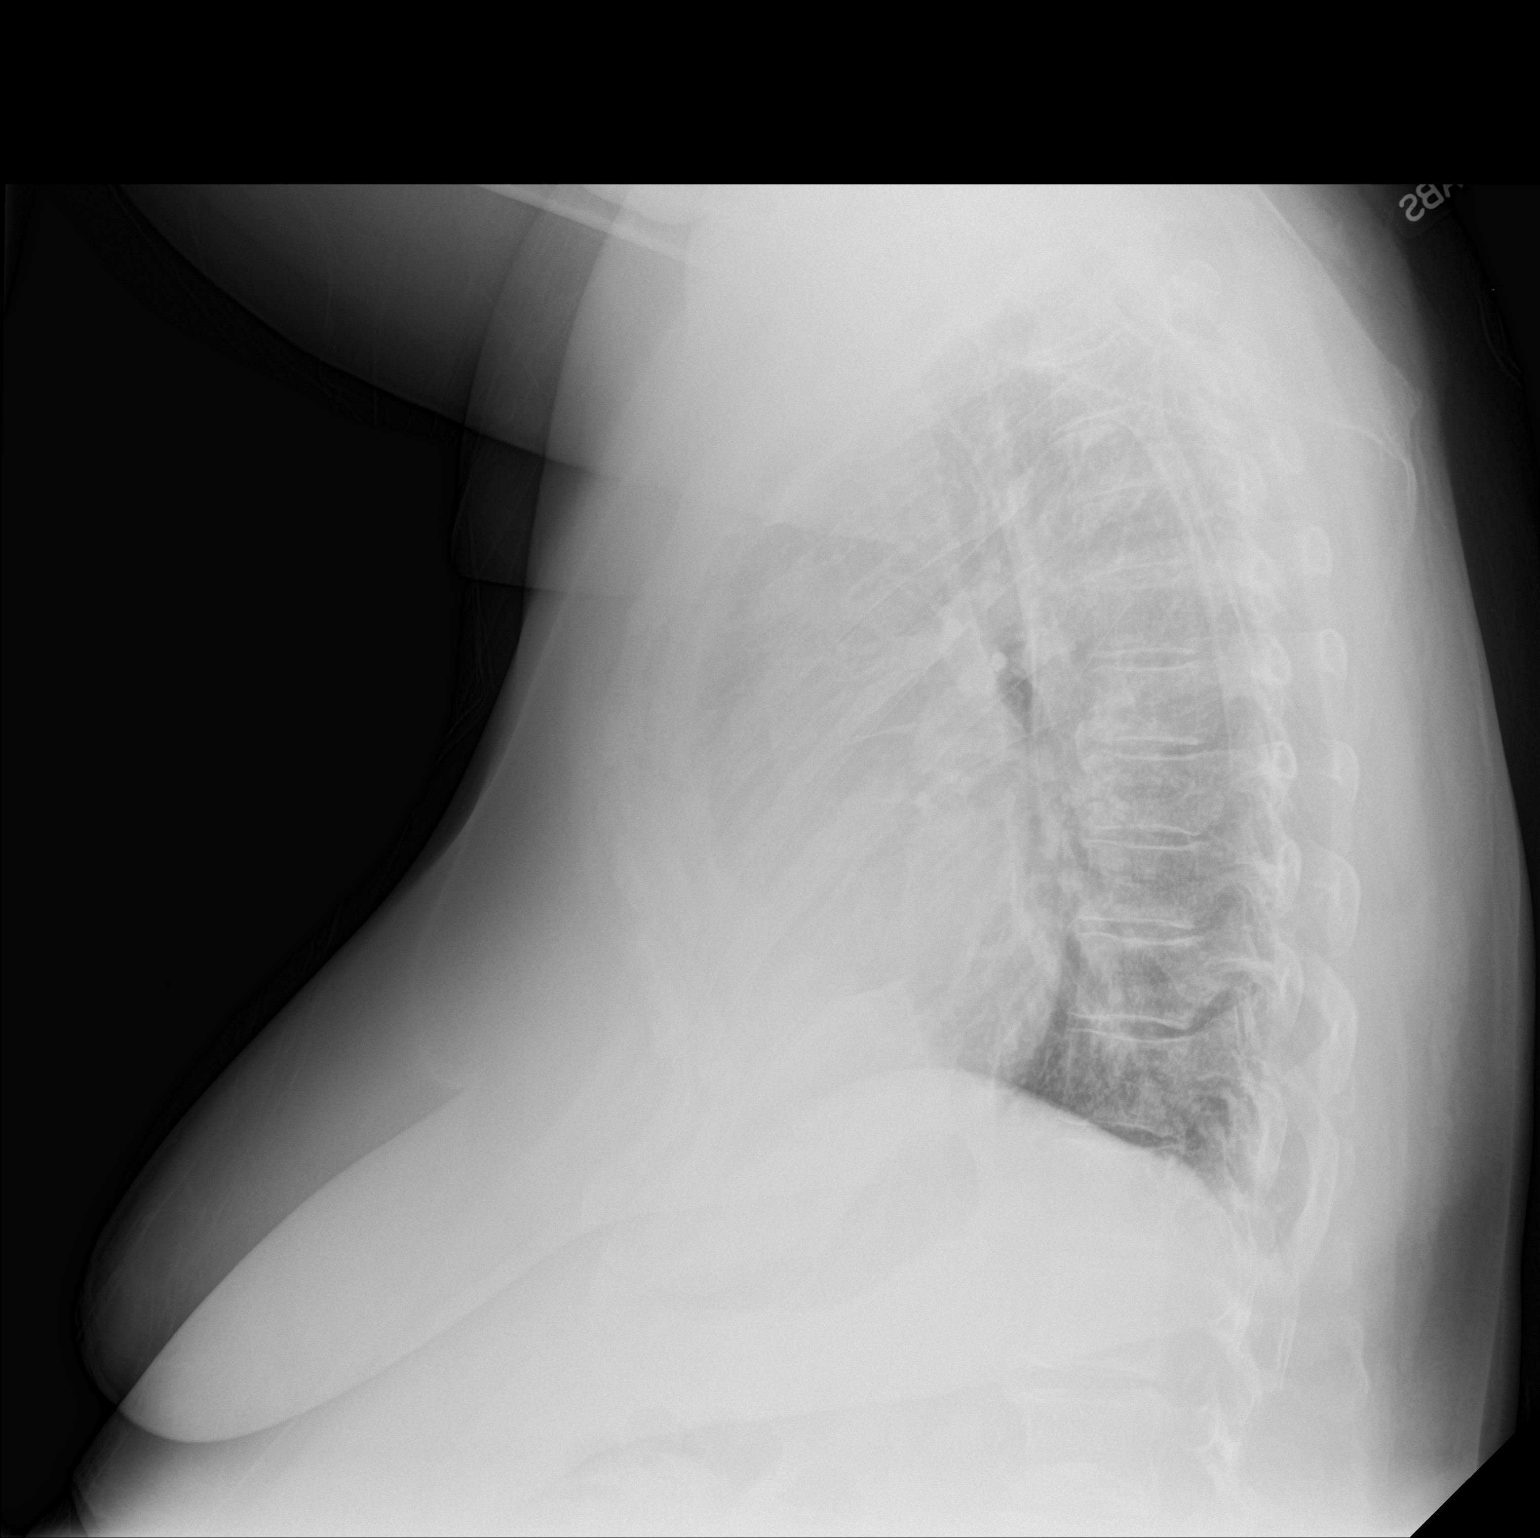

[2 of 2 positions shown; findings below may reference images not displayed]

FINDINGS: The lungs are adequately inflated. The interstitial markings are
coarse. The heart is mildly enlarged. The pulmonary vascularity is
normal. There is no pleural effusion. The mediastinum is normal in
width. The bony thorax exhibits no acute abnormality.
IMPRESSION: Chronic bronchitic-smoking related changes, stable. There is no
pneumonia nor other acute cardiopulmonary disease. Mild stable
cardiomegaly.

## 2018-06-22 IMAGING — MG MM DIGITAL DIAGNOSTIC UNILAT*L* W/ TOMO W/ CAD
5 series · 6 of 13 positions shown · non-contrast
Comparison: Previous exam(s).

CLINICAL DATA: Screening recall for a possible left breast mass.

EXAM:
2D DIGITAL DIAGNOSTIC LEFT MAMMOGRAM WITH CAD AND ADJUNCT TOMO
ULTRASOUND LEFT BREAST

[L CC]
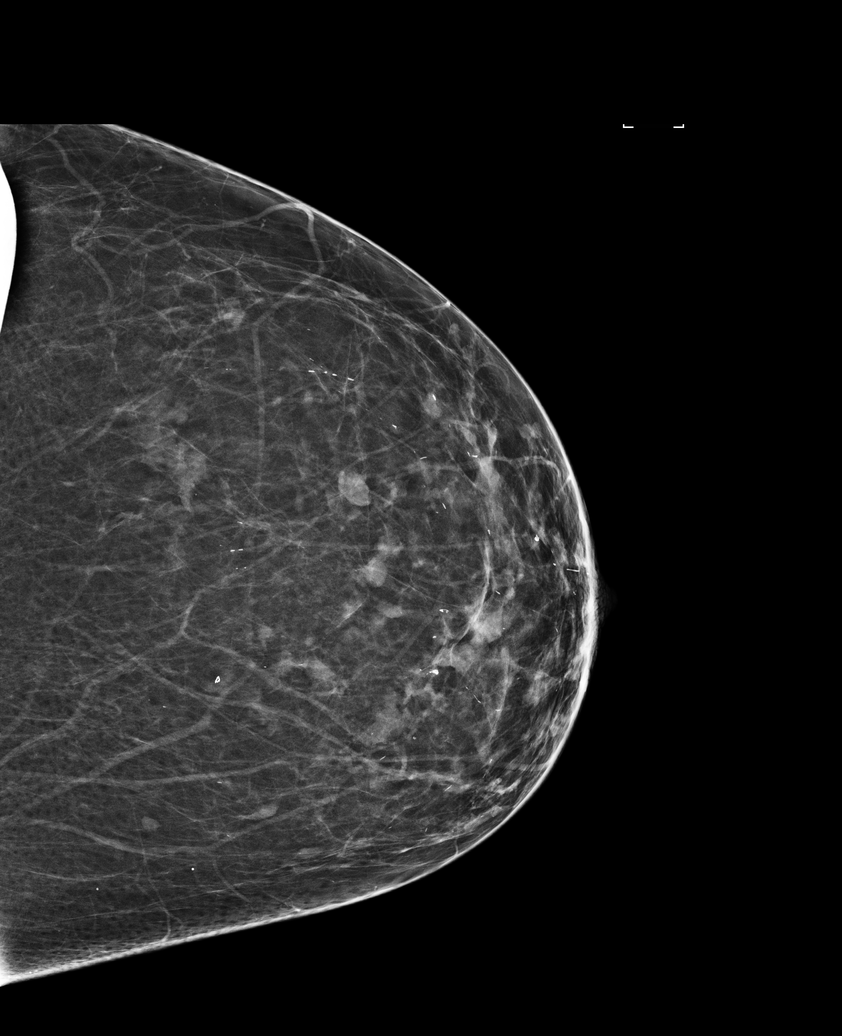

[L CC synth-2D]
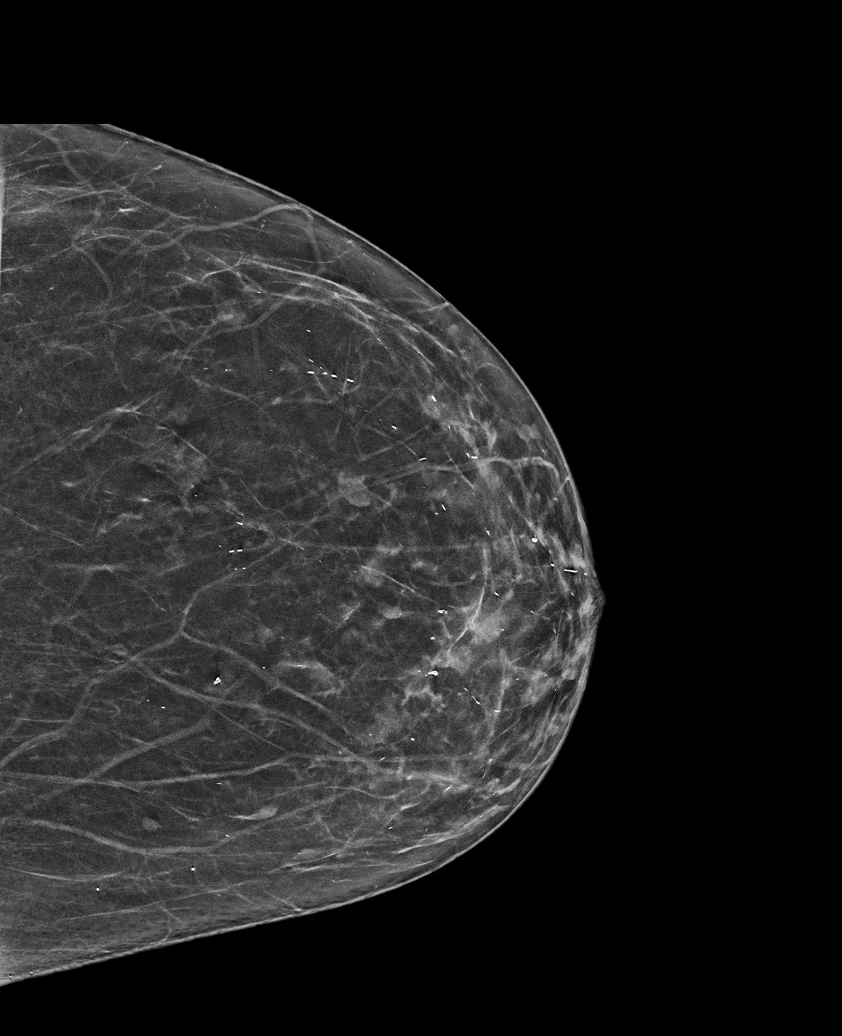

[L MLO]
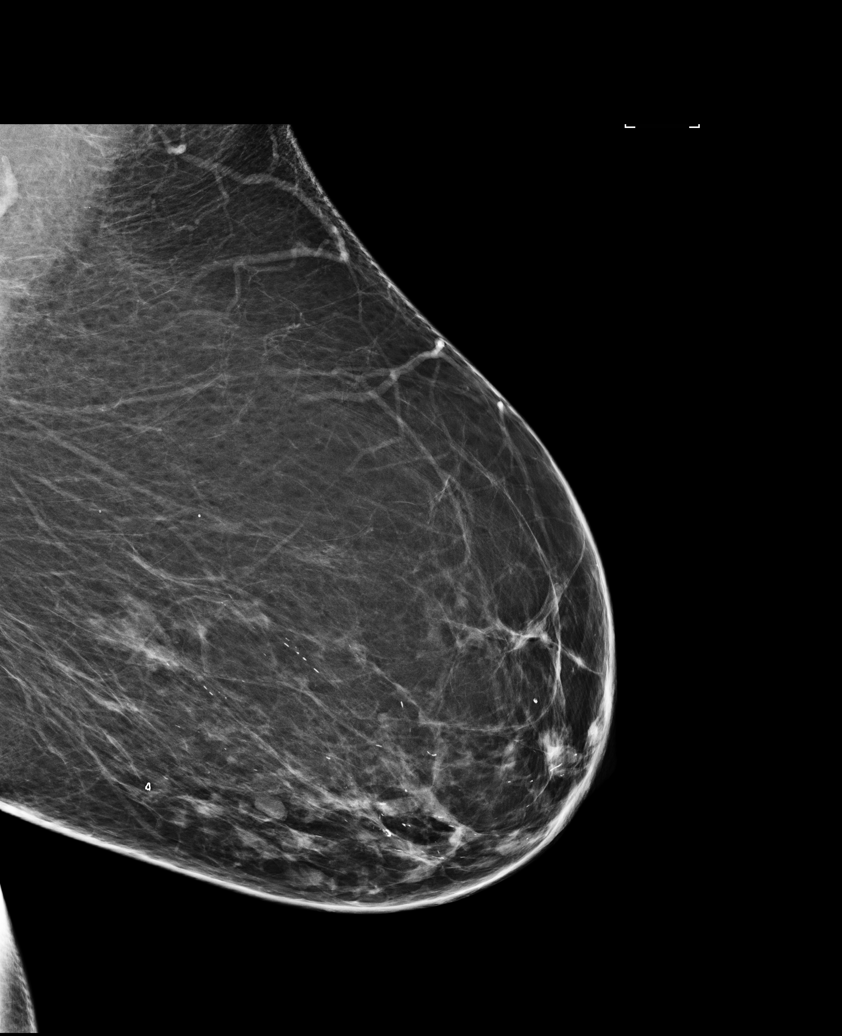

[L MLO tomo · 2 of 80 frames shown]
[frame 26/80]
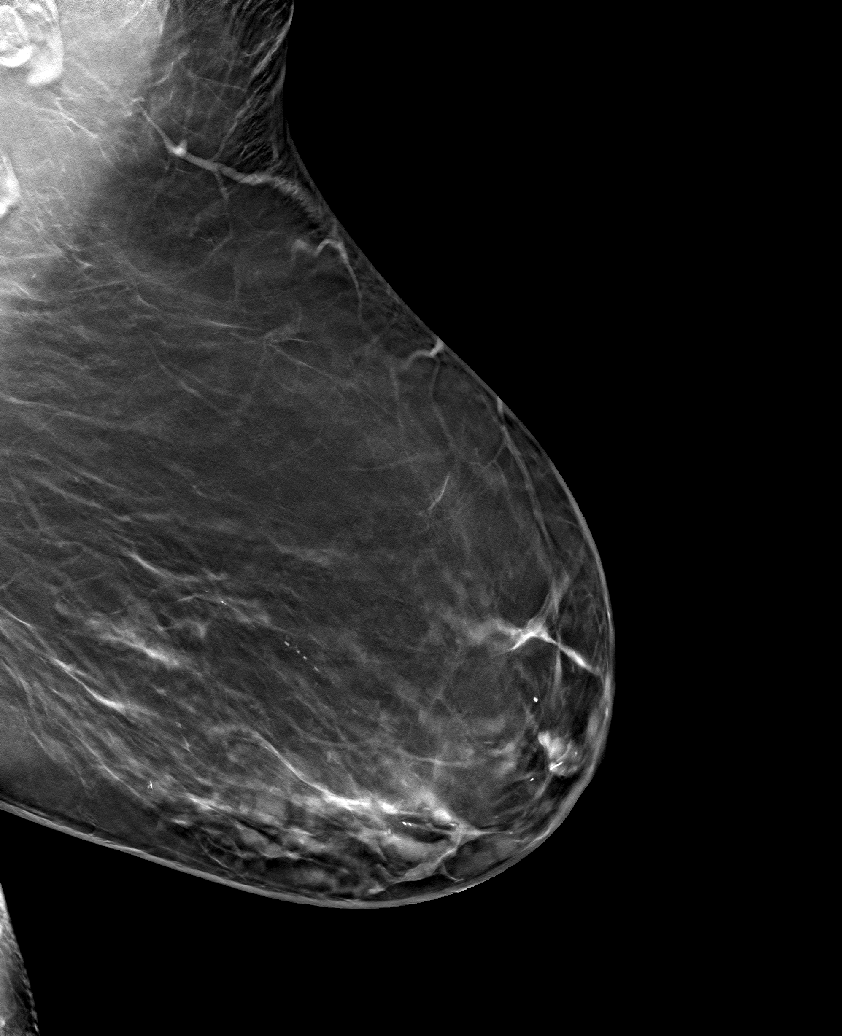
[frame 41/80]
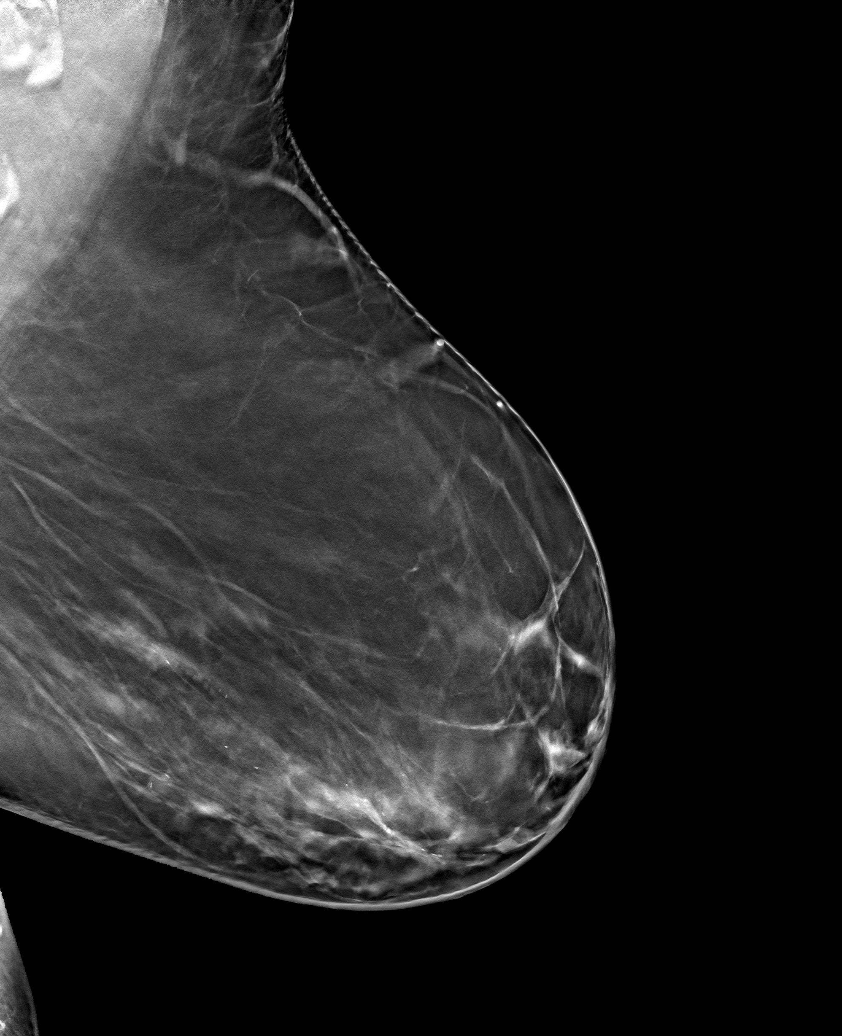

[L CC tomo · tomo slice 33/65.0]
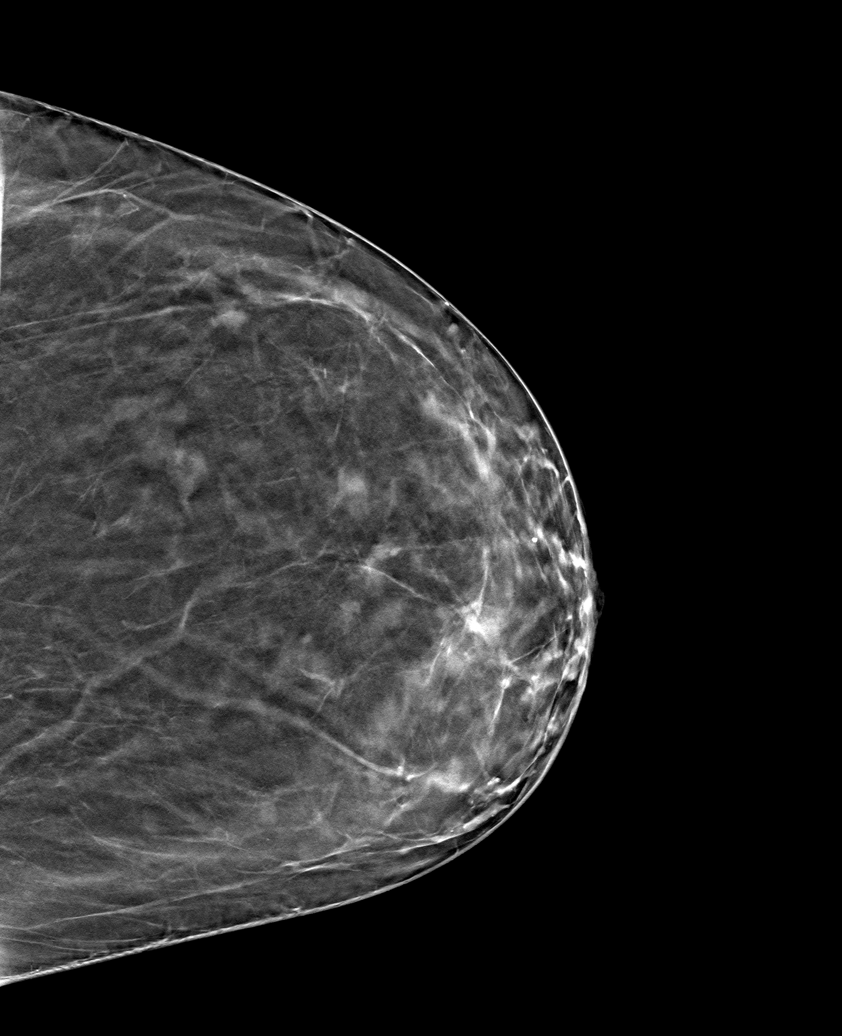

[6 of 13 positions shown; findings below may reference images not displayed]

ACR Breast Density Category b: There are scattered areas of
fibroglandular density.
FINDINGS: The possible mass noted in the inferior left breast persists on the
diagnostic images. It is oval with circumscribed margins. There are
several other circumscribed left breast masses, stable for multiple
years.

Mammographic images were processed with CAD.

Targeted ultrasound is performed, showing an oval simple cyst in the
left breast at 5 o'clock, 5 cm from the nipple, superficial depth,
measuring 7 x 5 x 5 mm, consistent in size, shape and location to
the mammographic finding. No solid masses or suspicious lesions.
IMPRESSION: 1. No evidence of malignancy.
2. Benign left breast cyst.

RECOMMENDATION:
Screening mammogram in one year.(Code:S1-C-BFP)

I have discussed the findings and recommendations with the patient.
Results were also provided in writing at the conclusion of the
visit. If applicable, a reminder letter will be sent to the patient
regarding the next appointment.

BI-RADS CATEGORY  2: Benign.

## 2019-01-22 ENCOUNTER — Telehealth: Payer: Self-pay | Admitting: Pulmonary Disease

## 2019-01-22 NOTE — Telephone Encounter (Signed)
Called patient for COVID-19 pre-screening for in office visit.  Have you recently traveled any where out of the local area in the last 2 weeks? No  Have you been in close contact with a person diagnosed with COVID-19 or someone awaiting results within the last 2 weeks? No  Do you currently have any of the following symptoms? If so, when did they start?  Cough (yes- occasionally for a month)  Diarrhea               Joint Pain Fever      Muscle Pain   Red eyes Shortness of breath (yes- about a month)                           Abdominal pain Vomiting Loss of smell    Rash    Sore Throat Headache    Weakness   Bruising or bleeding   Okay to proceed with visit. (date)  / Needs to reschedule visit. (date)

## 2019-01-22 NOTE — Telephone Encounter (Signed)
Contacted patient due to c/o occasional cough and SOB. Pt states that she only has SOB on exertion and only coughs occasionally. Pt denies any other symptoms. Advised ok to keep appt. As scheduled pt elected to switch to a phone visit.

## 2019-01-22 NOTE — Telephone Encounter (Signed)
Nothing further needed 

## 2019-01-23 ENCOUNTER — Encounter: Payer: Self-pay | Admitting: Pulmonary Disease

## 2019-01-23 ENCOUNTER — Telehealth: Payer: Self-pay | Admitting: Pulmonary Disease

## 2019-01-23 ENCOUNTER — Ambulatory Visit (INDEPENDENT_AMBULATORY_CARE_PROVIDER_SITE_OTHER): Payer: Medicare Other | Admitting: Pulmonary Disease

## 2019-01-23 DIAGNOSIS — J449 Chronic obstructive pulmonary disease, unspecified: Secondary | ICD-10-CM | POA: Diagnosis not present

## 2019-01-23 DIAGNOSIS — R911 Solitary pulmonary nodule: Secondary | ICD-10-CM

## 2019-01-23 DIAGNOSIS — R002 Palpitations: Secondary | ICD-10-CM | POA: Diagnosis not present

## 2019-01-23 DIAGNOSIS — R0609 Other forms of dyspnea: Secondary | ICD-10-CM | POA: Diagnosis not present

## 2019-01-23 MED ORDER — ADVAIR HFA 115-21 MCG/ACT IN AERO: 2.0000 | INHALATION_SPRAY | Freq: Two times a day (BID) | RESPIRATORY_TRACT | 12 refills | Status: DC

## 2019-01-23 NOTE — Progress Notes (Addendum)
PULMONARY OFFICE FOLLOW UP NOTE  Requesting MD/Service: Destiny Pink, MD Date of initial consultation: 09/30/15 Reason for consultation:  Dyspnea  PT PROFILE: 63 y.o. F former smoker of approx 30 p-y (quit 2002) referred for evaluation of exertional dyspnea. 09/30/15: Initial eval. Trial of Anoro inhaler. CXR and PFTs ordered. 10/30/15: Routine ROV: Anoro modestly beneficial. Still with mod-severe dyspnea on exertion. Complained of CP - nonexertional, positional, deemed likely due to GERD. CXR reviewed: cardiomegaly with interstitial prominence suggestive of CHF. PFTs revealed no obstruction, mild restriction, normal DLCO. PLAN: continue Anoro; check CBC (normal), BNP (normal); change PPI to dinner time; ROV 4-6 weeks  12/02/15: Improved DOE on Anoro. No new complaints. Discussed weight loss strategies. ROV 3 months with target weight of 225# 03/04/16 No new respiratory complaints. Weight is up 19#. Discussed weight loss strategies in detail CT chest 07/13/16:  Rounded masslike opacity in the posterior right lower lobe with surrounding ground-glass opacities. Differential includes focal pneumonia versus neoplasm. Given history of fever, pneumonia is favored, however close clinical follow-up is recommended. 07/23/16 Office visit. Continue Anoro. Follow up in 6-8 weeks with repeat CT chest 09/06/16 CT chest: significant partial clearing of apparent pneumonia from the right lower lobe, posterior segment. A smaller area of irregular opacity remains in the posterior segment right lower lobe 01/15/19 Cardiac CTA revealed no flow-limiting stenoses.  Incidentally noted 3 mm solid pulmonary nodule and RUL and linear bandlike opacity in R lung base, likely scarring versus atelectasis. If the patient is low risk for lung cancer, no further follow-up is recommended.  If the patient is high risk for lung cancer, consider 12 month follow-up CT.    Virtual Visit via Telephone Note I connected with [name] on  01/23/19 at  9:15 AM EDT by telephone and verified that I am speaking with the correct person using two identifiers. I discussed the limitations, risks, security and privacy concerns of performing an evaluation and management service by telephone and the availability of in person appointments. I also discussed with the patient that there may be a patient responsible charge related to this service. The patient expressed understanding and agreed to proceed.   INTERVAL: Last visit 02/2018. No major documented pulmonary events. Has undergone cardiac eval @ [facility] for DOE and palpitations with CP. NM SPECT revealed small reversible perfusion abnormality of mild intensity is present in the anterior region on the stress images. 72 hr Holter monitoring frequent PVCs and rare PACs.  There were no sustained ventricular or supraventricular runs. Cardiac CTA revealed no flow-limiting stenoses.  Incidentally noted 3 mm solid pulmonary nodule and RUL and linear bandlike opacity in R lung base, likely scarring versus atelectasis.  SUBJ: Last visit was September 2019.  At that time, I recommended that she follow-up as needed.  She made this appointment due to increased SOB/DOE.  This encounter was performed remotely (via telephone) due to the coronavirus pandemic.  She reports severe (class III-IV) persistent dyspnea on minimal exertion.  This has been present for 6-7 weeks.  She has undergone an extensive cardiac eval as documented above and reports that her "heart is OK" she also reports occasional nonproductive or minimally productive cough.  She denies purulent sputum and hemoptysis.  She notes pleurodynia of approximately 1 week duration.  She denies fever.  She has tried her albuterol inhaler which doesn't help.  She reports no LE edema.  She does have palpitations which has been evaluated by Holter monitoring with results documented above.  She is  scheduled to see Dr Ubaldo Glassing next week.   OBJ:  There were no vitals  filed for this visit.   EXAM:  Due to the remote nature of this encounter, no physical examination could be performed   DATA:  BMP Latest Ref Rng & Units 07/12/2017 07/13/2016 07/12/2016  Glucose 65 - 99 mg/dL - 207(H) 162(H)  BUN 6 - 20 mg/dL - 16 22(H)  Creatinine 0.44 - 1.00 mg/dL 0.70 0.73 0.86  Sodium 135 - 145 mmol/L - 137 138  Potassium 3.5 - 5.1 mmol/L - 3.7 3.8  Chloride 101 - 111 mmol/L - 102 101  CO2 22 - 32 mmol/L - 27 29  Calcium 8.9 - 10.3 mg/dL - 8.7(L) 9.1   CBC Latest Ref Rng & Units 07/13/2016 07/12/2016 06/26/2016  WBC 3.6 - 11.0 K/uL 17.1(H) 21.5(H) 16.0(H)  Hemoglobin 12.0 - 16.0 g/dL 11.1(L) 13.1 13.0  Hematocrit 35.0 - 47.0 % 33.8(L) 39.6 40.3  Platelets 150 - 440 K/uL 246 293 285    CXR: No new film   IMPRESSION:     ICD-10-CM   1. Mild chronic asthmatic bronchitis (HCC)  J44.9   2. Palpitations  R00.2   3. Progressive DOE  R06.09 DG Chest 2 View    Pulmonary Function Test [facility] Only  4. Lung nodule  R91.1    Previously noted to have chronic asthmatic bronchitis.  Her progressive dyspnea is of unclear etiology.  She does have a lot of symptoms suggesting a cardiac problem.  However, she has undergone extensive cardiac evaluation recently without a diagnosis rendered.  The lung nodule is an incidental finding and probably insignificant.  PLAN:  1) I recommended that she resume Advair which she was on previously.  I have refilled this prescription. 2) continue albuterol inhaler as needed for increased wheezing, chest tightness, shortness of breath, cough 3) Dr. Ubaldo Glassing might consider echocardiogram specifically to evaluate for possibility of pulmonary hypertension 4) follow up in person in 2-3 weeks with CXR prior to that visit.   5) I have also ordered PFTs but these will not likely be performed prior to office evaluation.    Merton Border, MD PCCM service Mobile [phone] Pager [phone] 01/23/2019 1:14 PM

## 2019-01-23 NOTE — Patient Instructions (Addendum)
Resume Advair HFA inhaler, 2 actuations twice a day.  Rinse mouth after use.  Prescription refilled Continue albuterol inhaler as needed for increased wheezing, chest tightness, shortness of breath, cough Recommend echocardiogram.  I will discuss with Dr. Ubaldo Glassing to get this done I will order PFTs (lung function test).  It is unlikely that we will be able to get this done prior to your follow-up with me Follow-up in 2-3 weeks with chest x-ray prior to that visit

## 2019-01-23 NOTE — Telephone Encounter (Signed)
Pt returned call- [phone]

## 2019-01-23 NOTE — Telephone Encounter (Signed)
Called [facility] to request CD of coronary CT angiogram. Per [facility] they need a signed medical release faxed to [phone]. Contacted pt who is going to call [facility] and request the CD to be mailed.Pt also stated that she can not afford the Advair inhaler that was prescribed to her today and is going to contact her insurance plan for an affordable alternative and call us back.

## 2019-01-24 ENCOUNTER — Other Ambulatory Visit (HOSPITAL_COMMUNITY): Payer: BLUE CROSS/BLUE SHIELD

## 2019-01-24 ENCOUNTER — Ambulatory Visit: Payer: BLUE CROSS/BLUE SHIELD

## 2019-01-24 ENCOUNTER — Ambulatory Visit
Admission: RE | Admit: 2019-01-24 | Discharge: 2019-01-24 | Disposition: A | Discharge status: Home / Self Care | Payer: Medicare Other | Source: Ambulatory Visit | Attending: Pulmonary Disease | Admitting: Pulmonary Disease

## 2019-01-24 ENCOUNTER — Other Ambulatory Visit: Payer: Self-pay | Admitting: Pulmonary Disease

## 2019-01-24 DIAGNOSIS — R0609 Other forms of dyspnea: Secondary | ICD-10-CM | POA: Diagnosis not present

## 2019-01-24 NOTE — Telephone Encounter (Signed)
Returned call to patient who stated that she was unsuccessful in her attempts to get a CD sent from [place] so she will be stopping by to sign a medical release so that we can fax it over. Also pt stated that she cannot afford the Advair and her insurance was unable to give her an affordable alternative. Advised pt that I would forward the message to Dr.Simonds for recommendations and we would call her back.

## 2019-01-24 NOTE — Telephone Encounter (Signed)
Left message x1 for pt.

## 2019-01-26 MED ORDER — BREO ELLIPTA 100-25 MCG/INH IN AEPB: 1.0000 | INHALATION_SPRAY | Freq: Every day | RESPIRATORY_TRACT | 0 refills | Status: AC

## 2019-01-26 NOTE — Telephone Encounter (Signed)
Pt Notified that per Dr.Simonds we will switch from the Advair to [facility] and I will have a sample at the front desk for her to pick up today when she comes to sign a record release.

## 2019-01-26 NOTE — Telephone Encounter (Signed)
We will provide her with a sample when she comes in or she can come by to pick up a sample of Breo 100-25, one inhalation daily  Thanks  Waunita Schooner

## 2019-02-05 NOTE — Telephone Encounter (Deleted)
Record release faxed to [facility].

## 2019-02-05 NOTE — Telephone Encounter (Signed)
Record release faxed x2 to [facility] on 01/26/2019 and 02/05/2019. Fax [phone].

## 2019-02-06 ENCOUNTER — Telehealth: Payer: Self-pay | Admitting: Pulmonary Disease

## 2019-02-06 NOTE — Telephone Encounter (Signed)
Called patient for COVID-19 pre-screening for in office visit.  Have you recently traveled any where out of the local area in the last 2 weeks? No  Have you been in close contact with a person diagnosed with COVID-19 or someone awaiting results within the last 2 weeks? No  Do you currently have any of the following symptoms? If so, when did they start? Cough     Diarrhea   Joint Pain Fever      Muscle Pain   Red eyes  Shortness of breath (yes- one month)                 Abdominal pain Vomiting Loss of smell    Rash    Sore Throat Headache    Weakness   Bruising or bleeding

## 2019-02-06 NOTE — Telephone Encounter (Signed)
Ok to proceed with appt. As scheduled.

## 2019-02-07 ENCOUNTER — Other Ambulatory Visit: Payer: Self-pay

## 2019-02-07 ENCOUNTER — Ambulatory Visit (INDEPENDENT_AMBULATORY_CARE_PROVIDER_SITE_OTHER): Payer: Medicare Other | Admitting: Pulmonary Disease

## 2019-02-07 ENCOUNTER — Encounter: Payer: Self-pay | Admitting: Pulmonary Disease

## 2019-02-07 VITALS — BP 134/78 | HR 87 | Temp 97.5°F | Ht 63.0 in | Wt 227.2 lb

## 2019-02-07 DIAGNOSIS — J449 Chronic obstructive pulmonary disease, unspecified: Secondary | ICD-10-CM

## 2019-02-07 DIAGNOSIS — R0609 Other forms of dyspnea: Secondary | ICD-10-CM | POA: Diagnosis not present

## 2019-02-07 MED ORDER — BREO ELLIPTA 100-25 MCG/INH IN AEPB: 1.0000 | INHALATION_SPRAY | Freq: Every day | RESPIRATORY_TRACT | 0 refills | Status: AC

## 2019-02-07 MED ORDER — BREO ELLIPTA 100-25 MCG/INH IN AEPB: 1.0000 | INHALATION_SPRAY | Freq: Every day | RESPIRATORY_TRACT | 5 refills | Status: DC

## 2019-02-07 NOTE — Progress Notes (Signed)
PULMONARY OFFICE FOLLOW UP NOTE  Requesting MD/Service: [place] Pink, MD Date of initial consultation: 09/30/15 Reason for consultation:  Dyspnea  PT PROFILE: 63 y.o. F former smoker of approx 30 p-y (quit 2002) referred for evaluation of exertional dyspnea.   DATA: PFTs 10/29/15:  CT chest 07/13/16:  Rounded masslike opacity in the posterior right lower lobe with surrounding ground-glass opacities. Differential includes focal pneumonia versus neoplasm. Given history of fever, pneumonia is favored, however close clinical follow-up is recommended. 09/06/16 CT chest: significant partial clearing of apparent pneumonia from the right lower lobe, posterior segment. A smaller area of irregular opacity remains in the posterior segment right lower lobe 01/15/19 Cardiac CTA revealed no flow-limiting stenoses.  Incidentally noted 3 mm solid pulmonary nodule and RUL and linear bandlike opacity in R lung base, likely scarring versus atelectasis. If the patient is low risk for lung cancer, no further follow-up is recommended.  If the patient is high risk for lung cancer, consider 12 month follow-up CT.     INTERVAL: Last visit 01/23/19 which was performed remotely.  At that time, we resumed ICS/LABA inhaler and I recommended echocardiogram to be performed by Dr. Ubaldo Glassing to rule out pulmonary hypertension.  SUBJ: This is a scheduled follow-up.  She is now on Breo inhaler which he states "opened me up".  After using the Breo she does cough and her cough is productive of scant mucus.  She reports chest pain "all of the time".  She has occasional palpitations.  With regard to her shortness of breath, she does have moment to moment variation but little day-to-day variation overall.  She is not using albuterol inhaler at all.  She has not yet gotten a repeat echocardiogram.  She denies fever, purulent sputum, hemoptysis, LE edema and calf tenderness.   OBJ: Vitals:   02/07/19 0905  BP: 134/78  Pulse: 87   Temp: (!) 97.5 F (36.4 C)  TempSrc: Temporal  SpO2: 95%  Weight: 227 lb 3.2 oz (103.1 kg)  Height: 5\' 3"  (1.6 m)  RA   EXAM:  Gen: NAD HEENT: NCAT, sclerae white Neck: No JVD Lungs: breath sounds mildly diminished without wheezes or other adventitious sounds Cardiovascular: RRR, no murmurs Abdomen: Soft, nontender, normal BS Ext: without clubbing, cyanosis, edema Neuro: grossly intact Skin: Limited exam, no lesions noted    DATA:  BMP Latest Ref Rng & Units 07/12/2017 07/13/2016 07/12/2016  Glucose 65 - 99 mg/dL - 207(H) 162(H)  BUN 6 - 20 mg/dL - 16 22(H)  Creatinine 0.44 - 1.00 mg/dL 0.70 0.73 0.86  Sodium 135 - 145 mmol/L - 137 138  Potassium 3.5 - 5.1 mmol/L - 3.7 3.8  Chloride 101 - 111 mmol/L - 102 101  CO2 22 - 32 mmol/L - 27 29  Calcium 8.9 - 10.3 mg/dL - 8.7(L) 9.1   CBC Latest Ref Rng & Units 07/13/2016 07/12/2016 06/26/2016  WBC 3.6 - 11.0 K/uL 17.1(H) 21.5(H) 16.0(H)  Hemoglobin 12.0 - 16.0 g/dL 11.1(L) 13.1 13.0  Hematocrit 35.0 - 47.0 % 33.8(L) 39.6 40.3  Platelets 150 - 440 K/uL 246 293 285    CXR 7/29: Suggestion of cardiomegaly.  No acute pulmonary or cardiac findings.   IMPRESSION:     ICD-10-CM   1. Chronic asthmatic bronchitis ([facility])  J44.9   2. DOE (dyspnea on exertion)  R06.09    She seems to have responded favorably to [facility].  However, she continues to have dyspnea that seems out of proportion to objective findings.  There might  be a cardiac component to her exertional dyspnea.  Repeat PFTs are scheduled for 04/18/2019  PLAN:  Breo inhaler 100-25, 1 inhalation daily.  Rinse mouth after use.  Sample provided and prescription entered  Continue albuterol inhaler as needed for increased shortness of breath, wheezing, chest tightness, cough  Follow-up end of October with Dr. Mortimer Fries after PFTs are performed  Echocardiogram has reportedly been scheduled by Dr. Axel Filler, MD PCCM service Mobile [phone] Pager  [phone] 02/08/2019 4:13 PM

## 2019-02-07 NOTE — Patient Instructions (Signed)
Breo inhaler 100-25, 1 inhalation daily.  Rinse mouth after use.  Sample provided and prescription entered  Continue albuterol inhaler as needed for increased shortness of breath, wheezing, chest tightness, cough  Follow-up end of October with Dr. Mortimer Fries after PFTs are performed

## 2019-04-16 ENCOUNTER — Telehealth: Payer: Self-pay | Admitting: Pulmonary Disease

## 2019-04-16 NOTE — Telephone Encounter (Signed)
Left message to relay date/time of covid test.  04/17/2019 prior to 11:00 [facility]

## 2019-04-17 ENCOUNTER — Other Ambulatory Visit: Admission: RE | Admit: 2019-04-17 | Payer: BLUE CROSS/BLUE SHIELD | Source: Ambulatory Visit

## 2019-04-17 NOTE — Telephone Encounter (Signed)
Spoke to pt, who stated that she would have to cancel PFT, as she would not be able to make it for covid test.  Pt will call back to reschedule PFT.   Rhonda, please advise. Thanks

## 2019-04-17 NOTE — Telephone Encounter (Signed)
Left message for pt on mobile number on file.  ATC home home and received recording that my call could not be completed.  Spoke to pt's spouse, Bennie. Bennie stated that he would have pt reach out to our office.

## 2019-04-17 NOTE — Telephone Encounter (Signed)
Nothing else needed at this time. [name]  

## 2019-04-17 NOTE — Telephone Encounter (Signed)
Spoke with Pamala Hurry in Cardiopulmonary and canceled PFT. Pt states that she will call back to R/S. Rhonda J Cobb

## 2019-04-17 NOTE — Telephone Encounter (Signed)
LMOVM for Cardiopulmonary to cancel PFT scheduled for 04/18/2019 due to pt request. Pt states that she will call back to schedule. Rhonda J Cobb

## 2019-04-18 ENCOUNTER — Ambulatory Visit: Payer: BLUE CROSS/BLUE SHIELD

## 2019-10-31 NOTE — Progress Notes (Signed)
PCP: Maryland Pink, MD   Chief Complaint  Patient presents with  . Gynecologic Exam  . Vaginal Bleeding    has stopped now but bled for 3 days, has been cramping for a few months now    HPI:      [name] is a 64 y.o. G1P1001 who LMP was No LMP recorded. Patient is postmenopausal., presents today for her MEDICARE annual examination.  Her menses had been absent due to menopause but she started having light bleeding last wk for 3 days. Had to change pads BID. Also with dysmen, not improved with tylenol. Has been having cramping for past yr as well.  Sex activity: not sex active. She does not have vaginal dryness. No vag sx, no vag bleeding.  Has had issues with worsening SUI and new onset urge incont/OAB the past 6 months.  Has urinary frequency with decreased flow too. Drinks a little caffeine. No dysuria, hematuria.   Last Pap: 04/14/17 Results: no abnormalities/neg HPV DNA. no hx of abnormal paps. Hx of STDs: none  Last mammogram: February 04, 2017  Results were: normal--routine follow-up in 12 months. Hx of LT breast cyst on mammo.  There is a FH of breast cancer in her mom, genetic testing not indicated. There is no FH of ovarian cancer. The patient does do self-breast exams.  Colonoscopy: flex sig 2018 with Pre-cancerous polyps with Dr. Vira Agar at [facility]. Repeat due after 2 years. Pt didn't have done last yr due to covid  Tobacco use: The patient denies current or previous tobacco use. Alcohol use: none  No drug use Exercise: not active  She does get adequate calcium but not Vitamin D in her diet.  Labs with PCP.  Past Medical History:  Diagnosis Date  . Anemia   . Anxiety   . Arthritis   . Asthma   . Benign essential tremor   . Claustrophobia   . Depression   . Dysrhythmia   . Fibromyalgia   . Fusion of spine of cervical region   . Gastric ulcer   . GERD (gastroesophageal reflux disease)   . Headache   . Hemorrhoids   . History of chicken pox   .  History of motion sickness   . History of pleurisy   . History of seasonal allergies   . Hyperlipidemia   . Hypertension   . Hypothyroidism   . Leukocytosis   . PONV (postoperative nausea and vomiting)   . Radiculopathy of cervical region     Past Surgical History:  Procedure Laterality Date  . ALLOGRAFT APPLICATION    . BREAST BIOPSY Bilateral 1970's  . BREAST BIOPSY Left    stereotactic  . CESAREAN SECTION    . COLONOSCOPY WITH PROPOFOL N/A 04/14/2016   Procedure: COLONOSCOPY WITH PROPOFOL;  Surgeon: Manya Silvas, MD;  Location: [facility] ENDOSCOPY;  Service: Endoscopy;  Laterality: N/A;  . ESOPHAGOGASTRODUODENOSCOPY (EGD) WITH PROPOFOL N/A 04/14/2016   Procedure: ESOPHAGOGASTRODUODENOSCOPY (EGD) WITH PROPOFOL;  Surgeon: Manya Silvas, MD;  Location: [facility] ENDOSCOPY;  Service: Endoscopy;  Laterality: N/A;  . FLEXIBLE SIGMOIDOSCOPY N/A 11/03/2016   Procedure: FLEXIBLE SIGMOIDOSCOPY;  Surgeon: Manya Silvas, MD;  Location: [facility] ENDOSCOPY;  Service: Endoscopy;  Laterality: N/A;  . FLEXIBLE SIGMOIDOSCOPY N/A 01/31/2017   Procedure: FLEXIBLE SIGMOIDOSCOPY;  Surgeon: Manya Silvas, MD;  Location: [facility] ENDOSCOPY;  Service: Endoscopy;  Laterality: N/A;  . gastric ulcer    . JOINT REPLACEMENT    . LEG TENDON SURGERY    .  NERVE SURGERY     on back  . SHOULDER SURGERY    . TONSILLECTOMY    . TOTAL KNEE ARTHROPLASTY      Family History  Problem Relation Age of Onset  . Breast cancer Mother 35  . Hypertension Father   . Cancer Sister        lymph nodes    Social History   Socioeconomic History  . Marital status: Married    Spouse name: Not on file  . Number of children: Not on file  . Years of education: Not on file  . Highest education level: Not on file  Occupational History  . Not on file  Tobacco Use  . Smoking status: Former Smoker    Packs/day: 2.00    Years: 35.00    Pack years: 70.00    Quit date: 07/01/2000    Years since quitting: 19.3  . Smokeless  tobacco: Never Used  Substance and Sexual Activity  . Alcohol use: No  . Drug use: No  . Sexual activity: Not Currently    Birth control/protection: Post-menopausal  Other Topics Concern  . Not on file  Social History Narrative  . Not on file   Social Determinants of Health   Financial Resource Strain:   . Difficulty of Paying Living Expenses:   Food Insecurity:   . Worried About Charity fundraiser in the Last Year:   . Arboriculturist in the Last Year:   Transportation Needs:   . Film/video editor (Medical):   Marland Kitchen Lack of Transportation (Non-Medical):   Physical Activity:   . Days of Exercise per Week:   . Minutes of Exercise per Session:   Stress:   . Feeling of Stress :   Social Connections:   . Frequency of Communication with Friends and Family:   . Frequency of Social Gatherings with Friends and Family:   . Attends Religious Services:   . Active Member of Clubs or Organizations:   . Attends Archivist Meetings:   Marland Kitchen Marital Status:   Intimate Partner Violence:   . Fear of Current or Ex-Partner:   . Emotionally Abused:   Marland Kitchen Physically Abused:   . Sexually Abused:     Current Meds  Medication Sig  . acetaminophen (TYLENOL) 500 MG tablet Take 1,000 mg by mouth every 6 (six) hours as needed for mild pain or fever.   Marland Kitchen albuterol (VENTOLIN HFA) 108 (90 Base) MCG/ACT inhaler Inhale 2 puffs into the lungs every 6 (six) hours as needed for wheezing or shortness of breath.   . clindamycin (CLEOCIN) 150 MG capsule   . diltiazem (CARDIZEM CD) 240 MG 24 hr capsule   . lisinopril (PRINIVIL,ZESTRIL) 20 MG tablet Take 20 mg by mouth daily.   Marland Kitchen LORazepam (ATIVAN) 0.5 MG tablet TAKE 1 TABLET BY MOUTH ONCE DAILY AS NEEDED  . metoprolol succinate (TOPROL-XL) 25 MG 24 hr tablet Take by mouth.  . pantoprazole (PROTONIX) 40 MG tablet Take 40 mg by mouth daily.   . sertraline (ZOLOFT) 100 MG tablet Take 100 mg by mouth daily.      ROS:  Review of Systems    Constitutional: Negative for fatigue, fever and unexpected weight change.  Respiratory: Negative for cough, shortness of breath and wheezing.   Cardiovascular: Negative for chest pain, palpitations and leg swelling.  Gastrointestinal: Negative for blood in stool, constipation, diarrhea, nausea and vomiting.  Endocrine: Negative for cold intolerance, heat intolerance and polyuria.  Genitourinary: Positive  for difficulty urinating, frequency and vaginal bleeding. Negative for dyspareunia, dysuria, flank pain, genital sores, hematuria, menstrual problem, pelvic pain, urgency, vaginal discharge and vaginal pain.  Musculoskeletal: Positive for arthralgias. Negative for back pain, joint swelling and myalgias.  Skin: Negative for rash.  Neurological: Negative for dizziness, syncope, light-headedness, numbness and headaches.  Hematological: Negative for adenopathy.  Psychiatric/Behavioral: Positive for agitation. Negative for confusion, sleep disturbance and suicidal ideas. The patient is not nervous/anxious.      Objective: BP (!) 150/90   Ht 5\' 3"  (1.6 m)   Wt 227 lb (103 kg)   BMI 40.21 kg/m    Physical Exam Constitutional:      Appearance: She is well-developed.  Genitourinary:     Vulva, vagina, uterus, right adnexa and left adnexa normal.     No vulval lesion or tenderness noted.     No vaginal discharge, erythema or tenderness.     No cervical motion tenderness or polyp.     Uterus is not enlarged or tender.     No right or left adnexal mass present.     Right adnexa not tender.     Left adnexa not tender.  Neck:     Thyroid: No thyromegaly.  Cardiovascular:     Rate and Rhythm: Normal rate and regular rhythm.     Heart sounds: Normal heart sounds. No murmur.  Pulmonary:     Effort: Pulmonary effort is normal.     Breath sounds: Normal breath sounds.  Chest:     Breasts:        Right: No mass, nipple discharge, skin change or tenderness.        Left: No mass, nipple  discharge, skin change or tenderness.  Abdominal:     Palpations: Abdomen is soft.     Tenderness: There is no abdominal tenderness. There is no guarding.  Musculoskeletal:        General: Normal range of motion.     Cervical back: Normal range of motion.  Neurological:     General: No focal deficit present.     Mental Status: She is alert and oriented to person, place, and time.     Cranial Nerves: No cranial nerve deficit.  Skin:    General: Skin is warm and dry.  Psychiatric:        Mood and Affect: Mood normal.        Behavior: Behavior normal.        Thought Content: Thought content normal.        Judgment: Judgment normal.  Vitals reviewed.    ULTRASOUND REPORT  Location: [facility]  Date of Service: 11/01/2019    Indications:Abnormal Uterine Bleeding Findings:  The uterus is anteverted and measures 5.5 x 3.9 x 2.5 cm. Echo texture is homogenous without evidence of focal masses. The Endometrium measures 2.5 mm.  Right Ovary measures 2.4 x 1.2 x 1.4 cm. It is normal in appearance. Left Ovary measures 2.2 x 1.0 x 1.6 cm. It is normal in appearance. Survey of the adnexa demonstrates no adnexal masses. There is no free fluid in the cul de sac.  Impression: 1. Normal pelvic ultrasound.   Recommendations: 1.Clinical correlation with the patient's History and Physical Exam.   Gweneth Dimitri, RT  Assessment/Plan:  Encounter for annual routine gynecological examination  Cervical cancer screening - Plan: Cytology - PAP; due to PMB.   Encounter for screening mammogram for malignant neoplasm of breast - Plan: MM 3D SCREEN BREAST BILATERAL; pt  to sched mammo  Screening for colon cancer - Plan: Ambulatory referral to Gastroenterology; Refer to [facility] for scr colonoscopy due to age  PMB (postmenopausal bleeding) - Plan: Cytology - PAP, US PELVIS TRANSVAGINAL NON-OB (TV ONLY); Neg GYN u/s. EM=2.5 mm. No EMB needed at this time. Pt to f/u if PMB recurs and  will then do EMB. Check pap (although no hx of abn in past and neg cx exam).   Mixed incontinence - Plan: POCT Urinalysis Dipstick; Neg UA and GYN u/s. D/c caffeine. If sx persist, may need to try OAB meds.        GYN counsel breast self exam, mammography screening, menopause, adequate intake of calcium and vitamin D, diet and exercise    F/U  Return in 2 years (on 10/31/2021) for medicare annual./prn PMB.  Windsor Zirkelbach B. Johnathan Tortorelli, PA-C 11/02/2019 11:07 AM

## 2019-11-01 ENCOUNTER — Ambulatory Visit (INDEPENDENT_AMBULATORY_CARE_PROVIDER_SITE_OTHER): Payer: Medicare Other | Admitting: Obstetrics and Gynecology

## 2019-11-01 ENCOUNTER — Ambulatory Visit (INDEPENDENT_AMBULATORY_CARE_PROVIDER_SITE_OTHER): Payer: Medicare Other

## 2019-11-01 ENCOUNTER — Encounter: Payer: Self-pay | Admitting: Obstetrics and Gynecology

## 2019-11-01 ENCOUNTER — Other Ambulatory Visit (HOSPITAL_COMMUNITY)
Admission: RE | Admit: 2019-11-01 | Discharge: 2019-11-01 | Disposition: A | Discharge status: Home / Self Care | Payer: Medicare Other | Source: Ambulatory Visit | Attending: Obstetrics and Gynecology | Admitting: Obstetrics and Gynecology

## 2019-11-01 ENCOUNTER — Other Ambulatory Visit: Payer: Self-pay

## 2019-11-01 VITALS — BP 150/90 | Ht 63.0 in | Wt 227.0 lb

## 2019-11-01 DIAGNOSIS — Z124 Encounter for screening for malignant neoplasm of cervix: Secondary | ICD-10-CM | POA: Diagnosis not present

## 2019-11-01 DIAGNOSIS — Z1211 Encounter for screening for malignant neoplasm of colon: Secondary | ICD-10-CM | POA: Diagnosis not present

## 2019-11-01 DIAGNOSIS — N950 Postmenopausal bleeding: Secondary | ICD-10-CM

## 2019-11-01 DIAGNOSIS — Z01419 Encounter for gynecological examination (general) (routine) without abnormal findings: Secondary | ICD-10-CM

## 2019-11-01 DIAGNOSIS — Z1231 Encounter for screening mammogram for malignant neoplasm of breast: Secondary | ICD-10-CM

## 2019-11-01 DIAGNOSIS — N3946 Mixed incontinence: Secondary | ICD-10-CM

## 2019-11-01 LAB — POCT URINALYSIS DIPSTICK
Bilirubin, UA: NEGATIVE
Blood, UA: NEGATIVE
Glucose, UA: NEGATIVE
Ketones, UA: NEGATIVE
Leukocytes, UA: NEGATIVE
Nitrite, UA: NEGATIVE
Protein, UA: NEGATIVE
Spec Grav, UA: 1.025 (ref 1.010–1.025)
pH, UA: 6 (ref 5.0–8.0)

## 2019-11-01 NOTE — Patient Instructions (Signed)
I value your feedback and entrusting us with your care. If you get a [facility] patient survey, I would appreciate you taking the time to let us know about your experience today. Thank you!  As of June 07, 2019, your lab results will be released to your MyChart immediately, before I even have a chance to see them. Please give me time to review them and contact you if there are any abnormalities. Thank you for your patience.   [facility]: [phone]  [facility]: [phone]  

## 2019-11-02 ENCOUNTER — Encounter: Payer: Self-pay | Admitting: Obstetrics and Gynecology

## 2019-11-02 LAB — CYTOLOGY - PAP: Diagnosis: NEGATIVE

## 2020-02-13 ENCOUNTER — Other Ambulatory Visit
Admission: RE | Admit: 2020-02-13 | Discharge: 2020-02-13 | Disposition: A | Discharge status: Home / Self Care | Payer: Medicare Other | Source: Ambulatory Visit | Attending: Gastroenterology | Admitting: Gastroenterology

## 2020-02-13 ENCOUNTER — Other Ambulatory Visit: Payer: Self-pay

## 2020-02-13 DIAGNOSIS — Z20822 Contact with and (suspected) exposure to covid-19: Secondary | ICD-10-CM | POA: Diagnosis not present

## 2020-02-13 DIAGNOSIS — Z01812 Encounter for preprocedural laboratory examination: Secondary | ICD-10-CM | POA: Insufficient documentation

## 2020-02-13 LAB — SARS CORONAVIRUS 2 (TAT 6-24 HRS): SARS Coronavirus 2: NEGATIVE

## 2020-02-14 ENCOUNTER — Encounter: Payer: Self-pay | Admitting: *Deleted

## 2020-02-15 ENCOUNTER — Encounter
Admission: RE | Disposition: A | Discharge status: Home / Self Care | Payer: Self-pay | Source: Home / Self Care | Attending: Gastroenterology

## 2020-02-15 ENCOUNTER — Other Ambulatory Visit: Payer: Self-pay

## 2020-02-15 ENCOUNTER — Ambulatory Visit: Payer: Medicare Other | Admitting: Anesthesiology

## 2020-02-15 ENCOUNTER — Ambulatory Visit
Admission: RE | Admit: 2020-02-15 | Discharge: 2020-02-15 | Disposition: A | Discharge status: Home / Self Care | Payer: Medicare Other | Attending: Gastroenterology | Admitting: Gastroenterology

## 2020-02-15 ENCOUNTER — Encounter: Payer: Self-pay | Admitting: *Deleted

## 2020-02-15 DIAGNOSIS — F329 Major depressive disorder, single episode, unspecified: Secondary | ICD-10-CM | POA: Insufficient documentation

## 2020-02-15 DIAGNOSIS — R1310 Dysphagia, unspecified: Secondary | ICD-10-CM | POA: Insufficient documentation

## 2020-02-15 DIAGNOSIS — Z79899 Other long term (current) drug therapy: Secondary | ICD-10-CM | POA: Diagnosis not present

## 2020-02-15 DIAGNOSIS — K640 First degree hemorrhoids: Secondary | ICD-10-CM | POA: Diagnosis not present

## 2020-02-15 DIAGNOSIS — J45909 Unspecified asthma, uncomplicated: Secondary | ICD-10-CM | POA: Diagnosis not present

## 2020-02-15 DIAGNOSIS — K219 Gastro-esophageal reflux disease without esophagitis: Secondary | ICD-10-CM | POA: Insufficient documentation

## 2020-02-15 DIAGNOSIS — F419 Anxiety disorder, unspecified: Secondary | ICD-10-CM | POA: Insufficient documentation

## 2020-02-15 DIAGNOSIS — Z1211 Encounter for screening for malignant neoplasm of colon: Secondary | ICD-10-CM | POA: Diagnosis not present

## 2020-02-15 DIAGNOSIS — M797 Fibromyalgia: Secondary | ICD-10-CM | POA: Diagnosis not present

## 2020-02-15 DIAGNOSIS — M1990 Unspecified osteoarthritis, unspecified site: Secondary | ICD-10-CM | POA: Insufficient documentation

## 2020-02-15 DIAGNOSIS — D128 Benign neoplasm of rectum: Secondary | ICD-10-CM | POA: Diagnosis not present

## 2020-02-15 DIAGNOSIS — D175 Benign lipomatous neoplasm of intra-abdominal organs: Secondary | ICD-10-CM | POA: Insufficient documentation

## 2020-02-15 DIAGNOSIS — I10 Essential (primary) hypertension: Secondary | ICD-10-CM | POA: Diagnosis not present

## 2020-02-15 HISTORY — PX: ESOPHAGOGASTRODUODENOSCOPY (EGD) WITH PROPOFOL: SHX5813

## 2020-02-15 HISTORY — PX: COLONOSCOPY WITH PROPOFOL: SHX5780

## 2020-02-15 SURGERY — COLONOSCOPY WITH PROPOFOL
Anesthesia: General

## 2020-02-15 MED ORDER — SODIUM CHLORIDE 0.9 % IV SOLN: INTRAVENOUS | Status: DC

## 2020-02-15 MED ORDER — PROPOFOL 10 MG/ML IV BOLUS
INTRAVENOUS | Status: AC
  Filled 2020-02-15: qty 20

## 2020-02-15 MED ORDER — LIDOCAINE HCL (PF) 2 % IJ SOLN
INTRAMUSCULAR | Status: AC
  Filled 2020-02-15: qty 5

## 2020-02-15 MED ORDER — PROPOFOL 500 MG/50ML IV EMUL
INTRAVENOUS | Status: AC
  Filled 2020-02-15: qty 50

## 2020-02-15 MED ORDER — LIDOCAINE HCL (CARDIAC) PF 100 MG/5ML IV SOSY
PREFILLED_SYRINGE | INTRAVENOUS | Status: DC | PRN
  Administered 2020-02-15: 80 mg via INTRAVENOUS

## 2020-02-15 MED ORDER — PROPOFOL 500 MG/50ML IV EMUL
INTRAVENOUS | Status: DC | PRN
  Administered 2020-02-15: 111 ug/kg/min via INTRAVENOUS

## 2020-02-15 NOTE — Op Note (Signed)
[facility] Gastroenterology Patient Name: [name] Procedure Date: 02/15/2020 1:19 PM MRN: [id number] Account #: [id number] Date of Birth: Oct 21, 1955 Admit Type: Outpatient Age: 64 Room: [facility] ENDO ROOM 3 Gender: Female Note Status: Finalized Procedure:             Colonoscopy Indications:           High risk colon cancer surveillance: Personal history                         of adenoma with villous component Providers:             Andrey Farmer MD, MD Referring MD:          Irven Easterly. Kary Kos, MD (Referring MD) Medicines:             Monitored Anesthesia Care Complications:         No immediate complications. Estimated blood loss:                         Minimal. Procedure:             Pre-Anesthesia Assessment:                        - Prior to the procedure, a History and Physical was                         performed, and patient medications and allergies were                         reviewed. The patient is competent. The risks and                         benefits of the procedure and the sedation options and                         risks were discussed with the patient. All questions                         were answered and informed consent was obtained.                         Patient identification and proposed procedure were                         verified by the physician, the nurse, the anesthetist                         and the technician in the endoscopy suite. Mental                         Status Examination: alert and oriented. Airway                         Examination: normal oropharyngeal airway and neck                         mobility. Respiratory Examination: clear to  auscultation. CV Examination: normal. Prophylactic                         Antibiotics: The patient does not require prophylactic                         antibiotics. Prior Anticoagulants: The patient has                         taken no previous  anticoagulant or antiplatelet                         agents. ASA Grade Assessment: III - A patient with                         severe systemic disease. After reviewing the risks and                         benefits, the patient was deemed in satisfactory                         condition to undergo the procedure. The anesthesia                         plan was to use monitored anesthesia care (MAC).                         Immediately prior to administration of medications,                         the patient was re-assessed for adequacy to receive                         sedatives. The heart rate, respiratory rate, oxygen                         saturations, blood pressure, adequacy of pulmonary                         ventilation, and response to care were monitored                         throughout the procedure. The physical status of the                         patient was re-assessed after the procedure.                        After obtaining informed consent, the colonoscope was                         passed under direct vision. Throughout the procedure,                         the patient's blood pressure, pulse, and oxygen                         saturations were monitored continuously. The  Colonoscope was introduced through the anus and                         advanced to the the cecum, identified by appendiceal                         orifice and ileocecal valve. The colonoscopy was                         performed without difficulty. The patient tolerated                         the procedure well. The quality of the bowel                         preparation was good. Findings:      The perianal and digital rectal examinations were normal.      There was a large lipoma, 50 mm in diameter, in the ascending colon.       Biopsies were taken with a cold forceps for histology. Estimated blood       loss was minimal.      There was a large lipoma, 50 mm  in diameter, in the transverse colon.       Biopsies were taken with a cold forceps for histology. Estimated blood       loss was minimal.      A 2 mm polyp was found in the sigmoid colon. The polyp was sessile. The       polyp was removed with a jumbo cold forceps. Resection and retrieval       were complete. Estimated blood loss was minimal.      A 1 mm polyp was found in the sigmoid colon. The polyp was sessile. The       polyp was removed with a jumbo cold forceps. Resection and retrieval       were complete. Estimated blood loss was minimal.      A 5 mm polyp was found in the rectum. The polyp was sessile. The polyp       was removed with a cold snare. Resection and retrieval were complete. To       prevent bleeding post-intervention, one hemostatic clip was successfully       placed. There was no bleeding at the end of the procedure.      Internal hemorrhoids were found during retroflexion. The hemorrhoids       were Grade I (internal hemorrhoids that do not prolapse). Impression:            - Large lipoma in the ascending colon. Biopsied.                        - Large lipoma in the transverse colon. Biopsied.                        - One 2 mm polyp in the sigmoid colon, removed with a                         jumbo cold forceps. Resected and retrieved.                        - One  1 mm polyp in the sigmoid colon, removed with a                         jumbo cold forceps. Resected and retrieved.                        - One 5 mm polyp in the rectum, removed with a cold                         snare. Resected and retrieved. Clip was placed.                        - Internal hemorrhoids. Recommendation:        - Repeat colonoscopy for surveillance based on                         pathology results.                        - Return to referring physician as previously                         scheduled.                        - Resume previous diet.                        - Continue  present medications. Procedure Code(s):     --- Professional ---                        [phone], Colonoscopy, flexible; with removal of                         tumor(s), polyp(s), or other lesion(s) by snare                         technique                        45380, 13, Colonoscopy, flexible; with biopsy, single                         or multiple Diagnosis Code(s):     --- Professional ---                        Z86.010, Personal history of colonic polyps                        D17.5, Benign lipomatous neoplasm of intra-abdominal                         organs                        K63.5, Polyp of colon                        K62.1, Rectal polyp  K64.0, First degree hemorrhoids CPT copyright 2019 American Medical Association. All rights reserved. The codes documented in this report are preliminary and upon coder review may  be revised to meet current compliance requirements. Andrey Farmer, MD Andrey Farmer MD, MD 02/15/2020 2:20:28 PM Number of Addenda: 0 Note Initiated On: 02/15/2020 1:19 PM Scope Withdrawal Time: 0 hours 16 minutes 40 seconds  Total Procedure Duration: 0 hours 21 minutes 13 seconds  Estimated Blood Loss:  Estimated blood loss was minimal.      [facility]

## 2020-02-15 NOTE — Anesthesia Preprocedure Evaluation (Signed)
Anesthesia Evaluation  Patient identified by MRN, date of birth, ID band Patient awake    Reviewed: Allergy & Precautions, NPO status , Patient's Chart, lab work & pertinent test results  History of Anesthesia Complications (+) PONV and history of anesthetic complications  Airway Mallampati: II  TM Distance: >3 FB Neck ROM: Full    Dental  (+) Poor Dentition   Pulmonary asthma (mild intermittent) , neg sleep apnea, former smoker,    breath sounds clear to auscultation- rhonchi (-) wheezing      Cardiovascular hypertension, Pt. on medications (-) CAD, (-) Past MI, (-) Cardiac Stents and (-) CABG  Rhythm:Regular Rate:Normal - Systolic murmurs and - Diastolic murmurs    Neuro/Psych  Headaches, neg Seizures PSYCHIATRIC DISORDERS Anxiety Depression    GI/Hepatic Neg liver ROS, PUD, GERD  ,  Endo/Other  neg diabetesHypothyroidism   Renal/GU negative Renal ROS     Musculoskeletal  (+) Arthritis , Fibromyalgia -  Abdominal (+) + obese,   Peds  Hematology  (+) anemia ,   Anesthesia Other Findings Past Medical History: No date: Anemia No date: Anxiety No date: Arthritis No date: Asthma No date: Benign essential tremor No date: Claustrophobia No date: Depression No date: Dysrhythmia No date: Fibromyalgia No date: Fusion of spine of cervical region No date: Gastric ulcer No date: GERD (gastroesophageal reflux disease) No date: Headache No date: Hemorrhoids No date: History of chicken pox No date: History of motion sickness No date: History of pleurisy No date: History of seasonal allergies No date: Hyperlipidemia No date: Hypertension No date: Hypothyroidism No date: Leukocytosis No date: PONV (postoperative nausea and vomiting) No date: Radiculopathy of cervical region   Reproductive/Obstetrics                             Anesthesia Physical Anesthesia Plan  ASA: III  Anesthesia  Plan: General   Post-op Pain Management:    Induction: Intravenous  PONV Risk Score and Plan: 3 and Propofol infusion  Airway Management Planned: Natural Airway  Additional Equipment:   Intra-op Plan:   Post-operative Plan:   Informed Consent: I have reviewed the patients History and Physical, chart, labs and discussed the procedure including the risks, benefits and alternatives for the proposed anesthesia with the patient or authorized representative who has indicated his/her understanding and acceptance.     Dental advisory given  Plan Discussed with: CRNA and Anesthesiologist  Anesthesia Plan Comments:         Anesthesia Quick Evaluation

## 2020-02-15 NOTE — Anesthesia Postprocedure Evaluation (Signed)
Anesthesia Post Note  Patient: KATRINKA HERBISON  Procedure(s) Performed: COLONOSCOPY WITH PROPOFOL (N/A ) ESOPHAGOGASTRODUODENOSCOPY (EGD) WITH PROPOFOL (N/A )  Patient location during evaluation: Endoscopy Anesthesia Type: General Level of consciousness: awake and alert and oriented Pain management: pain level controlled Vital Signs Assessment: post-procedure vital signs reviewed and stable Respiratory status: spontaneous breathing, nonlabored ventilation and respiratory function stable Cardiovascular status: blood pressure returned to baseline and stable Postop Assessment: no signs of nausea or vomiting Anesthetic complications: no   No complications documented.   Last Vitals:  Vitals:   02/15/20 1410 02/15/20 1420  BP:  134/72  Pulse:    Resp:    Temp: (!) 36.3 C   SpO2:      Last Pain:  Vitals:   02/15/20 1430  TempSrc:   PainSc: 0-No pain                 Zyrus Hetland

## 2020-02-15 NOTE — Interval H&P Note (Signed)
History and Physical Interval Note:  02/15/2020 1:22 PM  [name]  has presented today for surgery, with the diagnosis of ESOPHAGEAL DYSPHAGIA HX ADEN POLYPS.  The various methods of treatment have been discussed with the patient and family. After consideration of risks, benefits and other options for treatment, the patient has consented to  Procedure(s): COLONOSCOPY WITH PROPOFOL (N/A) ESOPHAGOGASTRODUODENOSCOPY (EGD) WITH PROPOFOL (N/A) as a surgical intervention.  The patient's history has been reviewed, patient examined, no change in status, stable for surgery.  I have reviewed the patient's chart and labs.  Questions were answered to the patient's satisfaction.     Lesly Rubenstein  Ok to proceed with EGD/Colonoscopy.

## 2020-02-15 NOTE — H&P (Signed)
Outpatient short stay form Pre-procedure 02/15/2020 1:19 PM Raylene Miyamoto MD, MPH  Primary Physician: Dr. Kary Kos  Reason for visit:  Dysphagia/Surveillance  History of present illness:   [name] is a 64 y/o lady with history of large TVA in rectum with follow-up flex sig's with one small TA. Also with reported history of BE and GEJ junction stricture dilated 3-4 years ago with 50 french catheter with good effect. Takes protonix daily. No blood thinners. No family history of GI malignancies. No abdominal surgeries.    Current Facility-Administered Medications:  .  0.9 %  sodium chloride infusion, , Intravenous, Continuous, Gaylin Osoria, Hilton Cork, MD, Last Rate: 20 mL/hr at 02/15/20 1304, Continued from Pre-op at 02/15/20 1304  Medications Prior to Admission  Medication Sig Dispense Refill Last Dose  . acetaminophen (TYLENOL) 500 MG tablet Take 1,000 mg by mouth every 6 (six) hours as needed for mild pain or fever.    Past Week at Unknown time  . albuterol (VENTOLIN HFA) 108 (90 Base) MCG/ACT inhaler Inhale 2 puffs into the lungs every 6 (six) hours as needed for wheezing or shortness of breath.    Past Week at Unknown time  . clindamycin (CLEOCIN) 150 MG capsule    Past Month at Unknown time  . diltiazem (CARDIZEM CD) 240 MG 24 hr capsule    02/14/2020 at 1800  . lisinopril (PRINIVIL,ZESTRIL) 20 MG tablet Take 20 mg by mouth daily.    02/15/2020 at 0800  . LORazepam (ATIVAN) 0.5 MG tablet TAKE 1 TABLET BY MOUTH ONCE DAILY AS NEEDED   Past Month at Unknown time  . pantoprazole (PROTONIX) 40 MG tablet Take 40 mg by mouth daily.    02/14/2020 at 0800  . sertraline (ZOLOFT) 100 MG tablet Take 100 mg by mouth daily.   02/14/2020 at 0800  . metoprolol succinate (TOPROL-XL) 25 MG 24 hr tablet Take by mouth.        Allergies  Allergen Reactions  . Cat Hair Extract Other (See Comments) and Itching    Congestion, watery eyes   . Oxycodone Other (See Comments)    Withdrawal symptoms  . Zosyn  [Piperacillin Sod-Tazobactam So] Cough     Past Medical History:  Diagnosis Date  . Anemia   . Anxiety   . Arthritis   . Asthma   . Benign essential tremor   . Claustrophobia   . Depression   . Dysrhythmia   . Fibromyalgia   . Fusion of spine of cervical region   . Gastric ulcer   . GERD (gastroesophageal reflux disease)   . Headache   . Hemorrhoids   . History of chicken pox   . History of motion sickness   . History of pleurisy   . History of seasonal allergies   . Hyperlipidemia   . Hypertension   . Hypothyroidism   . Leukocytosis   . PONV (postoperative nausea and vomiting)   . Radiculopathy of cervical region     Review of systems:  Otherwise negative.    Physical Exam  Gen: Alert, oriented. Appears stated age.  HEENT: [place]/AT. PERRLA. Lungs: No respiratory distress Abd: soft, benign, no masses. BS+ Ext: No edema. Pulses 2+    Planned procedures: Proceed with EGD/colonoscopy. The patient understands the nature of the planned procedure, indications, risks, alternatives and potential complications including but not limited to bleeding, infection, perforation, damage to internal organs and possible oversedation/side effects from anesthesia. The patient agrees and gives consent to proceed.  Please refer to  procedure notes for findings, recommendations and patient disposition/instructions.     Raylene Miyamoto MD, MPH Gastroenterology 02/15/2020  1:19 PM

## 2020-02-15 NOTE — Transfer of Care (Signed)
Immediate Anesthesia Transfer of Care Note  Patient: [name]  Procedure(s) Performed: COLONOSCOPY WITH PROPOFOL (N/A ) ESOPHAGOGASTRODUODENOSCOPY (EGD) WITH PROPOFOL (N/A )  Patient Location: PACU and Endoscopy Unit  Anesthesia Type:General  Level of Consciousness: awake  Airway & Oxygen Therapy: Patient Spontanous Breathing and Patient connected to face mask oxygen  Post-op Assessment: Report given to RN and Post -op Vital signs reviewed and stable  Post vital signs: Reviewed and stable  Last Vitals:  Vitals Value Taken Time  BP 132/68 02/15/20 1410  Temp    Pulse 62 02/15/20 1410  Resp 17 02/15/20 1410  SpO2 96 % 02/15/20 1410  Vitals shown include unvalidated device data.  Last Pain:  Vitals:   02/15/20 1216  TempSrc: Temporal  PainSc: 0-No pain         Complications: No complications documented.

## 2020-02-15 NOTE — Op Note (Signed)
[facility] Gastroenterology Patient Name: [name] Procedure Date: 02/15/2020 1:19 PM MRN: [id number] Account #: [id number] Date of Birth: [birth year] Admit Type: Outpatient Age: 64 Room: [facility] ENDO ROOM 3 Gender: Female Note Status: Finalized Procedure:             Upper GI endoscopy Indications:           Dysphagia, Barrett's esophagus Providers:             Andrey Farmer MD, MD Referring MD:          Irven Easterly. Kary Kos, MD (Referring MD) Medicines:             Monitored Anesthesia Care Complications:         No immediate complications. Procedure:             Pre-Anesthesia Assessment:                        - Prior to the procedure, a History and Physical was                         performed, and patient medications and allergies were                         reviewed. The patient is competent. The risks and                         benefits of the procedure and the sedation options and                         risks were discussed with the patient. All questions                         were answered and informed consent was obtained.                         Patient identification and proposed procedure were                         verified by the physician, the nurse, the anesthetist                         and the technician in the endoscopy suite. Mental                         Status Examination: alert and oriented. Airway                         Examination: normal oropharyngeal airway and neck                         mobility. Respiratory Examination: clear to                         auscultation. CV Examination: normal. Prophylactic                         Antibiotics: The patient does not require prophylactic  antibiotics. Prior Anticoagulants: The patient has                         taken no previous anticoagulant or antiplatelet                         agents. ASA Grade Assessment: III - A patient with                          severe systemic disease. After reviewing the risks and                         benefits, the patient was deemed in satisfactory                         condition to undergo the procedure. The anesthesia                         plan was to use monitored anesthesia care (MAC).                         Immediately prior to administration of medications,                         the patient was re-assessed for adequacy to receive                         sedatives. The heart rate, respiratory rate, oxygen                         saturations, blood pressure, adequacy of pulmonary                         ventilation, and response to care were monitored                         throughout the procedure. The physical status of the                         patient was re-assessed after the procedure.                        After obtaining informed consent, the endoscope was                         passed under direct vision. Throughout the procedure,                         the patient's blood pressure, pulse, and oxygen                         saturations were monitored continuously. The Endoscope                         was introduced through the mouth, and advanced to the                         second part of duodenum. The upper GI endoscopy was  accomplished without difficulty. The patient tolerated                         the procedure well. Findings:      One small island of salmon-colored mucosa were present. The maximum       longitudinal extent of these esophageal mucosal changes was 1 cm in       length. Biopsies were taken with a cold forceps for histology. Estimated       blood loss was minimal.      No endoscopic abnormality was evident in the esophagus to explain the       patient's complaint of dysphagia. It was decided, however, to proceed       with dilation of the lower third of the esophagus. A TTS dilator was       passed through the scope. Dilation with a  15-16.5-18 mm balloon dilator       was performed to 18 mm. The dilation site was examined and showed no       change.      The entire examined stomach was normal.      The examined duodenum was normal. Impression:            If negative for Barrett's would recommend                         discontinuing surveillance.                        - Salmon-colored mucosa consistent with short-segment                         Barrett's esophagus. Biopsied.                        - No endoscopic esophageal abnormality to explain                         patient's dysphagia. Esophagus dilated. Dilated.                        - Normal stomach.                        - Normal examined duodenum. Recommendation:        - Discharge patient to home.                        - Resume previous diet.                        - Continue present medications.                        - Await pathology results. If biopsies negative for                         Barrett's would recommend discontinuing surveillance.                        - Repeat upper endoscopy for surveillance based on  pathology results.                        - Return to referring physician as previously                         scheduled. Procedure Code(s):     --- Professional ---                        [phone], Esophagogastroduodenoscopy, flexible,                         transoral; with transendoscopic balloon dilation of                         esophagus (less than 30 mm diameter) Diagnosis Code(s):     --- Professional ---                        K22.70, Barrett's esophagus without dysplasia                        R13.10, Dysphagia, unspecified CPT copyright 2019 American Medical Association. All rights reserved. The codes documented in this report are preliminary and upon coder review may  be revised to meet current compliance requirements. Andrey Farmer, MD Andrey Farmer MD, MD 02/15/2020 2:14:05 PM Number of  Addenda: 0 Note Initiated On: 02/15/2020 1:19 PM Estimated Blood Loss:  Estimated blood loss was minimal.      [facility]

## 2020-02-18 ENCOUNTER — Encounter: Payer: Self-pay | Admitting: Gastroenterology

## 2020-02-19 LAB — SURGICAL PATHOLOGY

## 2020-04-14 ENCOUNTER — Ambulatory Visit: Payer: Medicare Other | Attending: Internal Medicine

## 2020-04-14 DIAGNOSIS — Z23 Encounter for immunization: Secondary | ICD-10-CM

## 2020-04-14 NOTE — Progress Notes (Signed)
   Covid-19 Vaccination Clinic  Name:  [name]    MRN: [id number] DOB: 1955/08/13  04/14/2020  [name] was observed post Covid-19 immunization for 15 minutes without incident. She was provided with Vaccine Information Sheet and instruction to access the V-Safe system.   [name] was instructed to call 911 with any severe reactions post vaccine: Marland Kitchen Difficulty breathing  . Swelling of face and throat  . A fast heartbeat  . A bad rash all over body  . Dizziness and weakness

## 2020-05-08 ENCOUNTER — Inpatient Hospital Stay: Payer: Medicare Other | Attending: Oncology | Admitting: Oncology

## 2020-05-08 ENCOUNTER — Other Ambulatory Visit: Payer: Self-pay

## 2020-05-08 ENCOUNTER — Inpatient Hospital Stay: Payer: Medicare Other

## 2020-05-08 ENCOUNTER — Encounter: Payer: Self-pay | Admitting: Oncology

## 2020-05-08 VITALS — BP 164/94 | HR 71 | Temp 98.6°F | Resp 18 | Wt 231.1 lb

## 2020-05-08 DIAGNOSIS — F32A Depression, unspecified: Secondary | ICD-10-CM | POA: Diagnosis not present

## 2020-05-08 DIAGNOSIS — Z87891 Personal history of nicotine dependence: Secondary | ICD-10-CM | POA: Diagnosis not present

## 2020-05-08 DIAGNOSIS — Z803 Family history of malignant neoplasm of breast: Secondary | ICD-10-CM | POA: Diagnosis not present

## 2020-05-08 DIAGNOSIS — I10 Essential (primary) hypertension: Secondary | ICD-10-CM | POA: Diagnosis not present

## 2020-05-08 DIAGNOSIS — R634 Abnormal weight loss: Secondary | ICD-10-CM | POA: Insufficient documentation

## 2020-05-08 DIAGNOSIS — D72829 Elevated white blood cell count, unspecified: Secondary | ICD-10-CM | POA: Insufficient documentation

## 2020-05-08 DIAGNOSIS — E039 Hypothyroidism, unspecified: Secondary | ICD-10-CM | POA: Insufficient documentation

## 2020-05-08 LAB — CBC WITH DIFFERENTIAL/PLATELET
Abs Immature Granulocytes: 0.09 10*3/uL — ABNORMAL HIGH (ref 0.00–0.07)
Basophils Absolute: 0.1 10*3/uL (ref 0.0–0.1)
Basophils Relative: 1 %
Eosinophils Absolute: 0.3 10*3/uL (ref 0.0–0.5)
Eosinophils Relative: 2 %
HCT: 39.9 % (ref 36.0–46.0)
Hemoglobin: 13.2 g/dL (ref 12.0–15.0)
Immature Granulocytes: 1 %
Lymphocytes Relative: 34 %
Lymphs Abs: 4.6 10*3/uL — ABNORMAL HIGH (ref 0.7–4.0)
MCH: 26.6 pg (ref 26.0–34.0)
MCHC: 33.1 g/dL (ref 30.0–36.0)
MCV: 80.3 fL (ref 80.0–100.0)
Monocytes Absolute: 0.8 10*3/uL (ref 0.1–1.0)
Monocytes Relative: 6 %
Neutro Abs: 7.5 10*3/uL (ref 1.7–7.7)
Neutrophils Relative %: 56 %
Platelets: 261 10*3/uL (ref 150–400)
RBC: 4.97 MIL/uL (ref 3.87–5.11)
RDW: 14.6 % (ref 11.5–15.5)
WBC: 13.3 10*3/uL — ABNORMAL HIGH (ref 4.0–10.5)
nRBC: 0 % (ref 0.0–0.2)

## 2020-05-08 LAB — HIV ANTIBODY (ROUTINE TESTING W REFLEX): HIV Screen 4th Generation wRfx: NONREACTIVE

## 2020-05-08 LAB — TECHNOLOGIST SMEAR REVIEW
Plt Morphology: DECREASED
RBC Morphology: NORMAL

## 2020-05-08 LAB — HEPATIC FUNCTION PANEL
ALT: 20 U/L (ref 0–44)
AST: 25 U/L (ref 15–41)
Albumin: 3.9 g/dL (ref 3.5–5.0)
Alkaline Phosphatase: 84 U/L (ref 38–126)
Bilirubin, Direct: <0.1 mg/dL (ref 0.0–0.2)
Total Bilirubin: 0.4 mg/dL (ref 0.3–1.2)
Total Protein: 7.1 g/dL (ref 6.5–8.1)

## 2020-05-08 LAB — LACTATE DEHYDROGENASE: LDH: 150 U/L (ref 98–192)

## 2020-05-08 NOTE — Progress Notes (Signed)
New patient evaluation with history of anemia and last seen at the [facility] by Dr. Ma Hillock in 2014.  She is having unexplained feelings of off balance, fatigue, and legs feel heavy/painful.

## 2020-05-08 NOTE — Progress Notes (Signed)
Hematology/Oncology Consult note [facility] [phone] [phone]   Patient Care Team: Maryland Pink, MD as PCP - General (Family Medicine)  REFERRING PROVIDER: Maryland Pink, MD  CHIEF COMPLAINTS/REASON FOR VISIT:  Evaluation of leukocytosis  HISTORY OF PRESENTING ILLNESS:  [name] is a  64 y.o.  female with PMH listed below who was referred to me for evaluation of leukocytosis Reviewed patient' recent labs obtained by PCP.   04/22/2020 CBC showed elevated white count of 13.8,predominantly lymphocytosis.  Previous lab records reviewed. Leukocytosis onset of chronic, duration is since 2016 or earlier.   No aggravating or elevated factors. Associated symptoms or signs:  Denies  fever, chills,night sweats. chornic Fatigue. She reports unintentional weight loss.  Per [facility], 11/01/19 Weight was 227, 05/08/2020 weight is 231. No significant change for the past 6 months.  Smoking history: denies History of recent oral steroid use or steroid injection: denies  History of recent infection: denies.  Autoimmune disease history.  Denies.  Chronic neck pain.  She reports that her legs are heavy. She lives at home with husband. 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 Reports stress level is very high.  She reports being seen by Dr.Pandit about 14 years ago. Records are not available in current EMR.    Review of Systems  Constitutional: Positive for fatigue. Negative for appetite change, chills and fever.  HENT:   Negative for hearing loss and voice change.   Eyes: Negative for eye problems.  Respiratory: Negative for chest tightness and cough.   Cardiovascular: Negative for chest pain.  Gastrointestinal: Negative for abdominal distention, abdominal pain and blood in stool.  Endocrine: Negative for hot flashes.  Genitourinary: Negative for difficulty urinating and frequency.   Musculoskeletal: Positive for arthralgias and neck pain.       Heavy  legs.  Skin: Negative for itching and rash.  Neurological: Negative for extremity weakness.  Hematological: Negative for adenopathy.  Psychiatric/Behavioral: Negative for confusion.     MEDICAL HISTORY:  Past Medical History:  Diagnosis Date  . Anemia   . Anxiety   . Arthritis   . Asthma   . Benign essential tremor   . Claustrophobia   . Depression   . Dysrhythmia   . Fibromyalgia   . Fusion of spine of cervical region   . Gastric ulcer   . GERD (gastroesophageal reflux disease)   . Headache   . Hemorrhoids   . History of chicken pox   . History of motion sickness   . History of pleurisy   . History of seasonal allergies   . Hyperlipidemia   . Hypertension   . Hypothyroidism   . Leukocytosis   . PONV (postoperative nausea and vomiting)   . Radiculopathy of cervical region     SURGICAL HISTORY: Past Surgical History:  Procedure Laterality Date  . ALLOGRAFT APPLICATION    . BREAST BIOPSY Bilateral 1970's  . BREAST BIOPSY Left    stereotactic  . CESAREAN SECTION    . COLONOSCOPY WITH PROPOFOL N/A 04/14/2016   Procedure: COLONOSCOPY WITH PROPOFOL;  Surgeon: Manya Silvas, MD;  Location: [facility] ENDOSCOPY;  Service: Endoscopy;  Laterality: N/A;  . COLONOSCOPY WITH PROPOFOL N/A 02/15/2020   Procedure: COLONOSCOPY WITH PROPOFOL;  Surgeon: Lesly Rubenstein, MD;  Location: [facility] ENDOSCOPY;  Service: Endoscopy;  Laterality: N/A;  . ESOPHAGOGASTRODUODENOSCOPY (EGD) WITH PROPOFOL N/A 04/14/2016   Procedure: ESOPHAGOGASTRODUODENOSCOPY (EGD) WITH PROPOFOL;  Surgeon: Manya Silvas, MD;  Location: [facility] ENDOSCOPY;  Service: Endoscopy;  Laterality: N/A;  . ESOPHAGOGASTRODUODENOSCOPY (EGD) WITH PROPOFOL N/A 02/15/2020   Procedure: ESOPHAGOGASTRODUODENOSCOPY (EGD) WITH PROPOFOL;  Surgeon: Lesly Rubenstein, MD;  Location: [facility] ENDOSCOPY;  Service: Endoscopy;  Laterality: N/A;  . FLEXIBLE SIGMOIDOSCOPY N/A 11/03/2016   Procedure: FLEXIBLE SIGMOIDOSCOPY;  Surgeon: Manya Silvas, MD;  Location: [facility] ENDOSCOPY;  Service: Endoscopy;  Laterality: N/A;  . FLEXIBLE SIGMOIDOSCOPY N/A 01/31/2017   Procedure: FLEXIBLE SIGMOIDOSCOPY;  Surgeon: Manya Silvas, MD;  Location: [facility] ENDOSCOPY;  Service: Endoscopy;  Laterality: N/A;  . gastric ulcer    . JOINT REPLACEMENT    . LEG TENDON SURGERY    . NERVE SURGERY     on back  . SHOULDER SURGERY    . TONSILLECTOMY    . TOTAL KNEE ARTHROPLASTY      SOCIAL HISTORY: Social History   Socioeconomic History  . Marital status: Married    Spouse name: Not on file  . Number of children: Not on file  . Years of education: Not on file  . Highest education level: Not on file  Occupational History  . Not on file  Tobacco Use  . Smoking status: Former Smoker    Packs/day: 2.00    Years: 35.00    Pack years: 70.00    Quit date: 07/01/2000    Years since quitting: 19.8  . Smokeless tobacco: Never Used  Vaping Use  . Vaping Use: Never used  Substance and Sexual Activity  . Alcohol use: No  . Drug use: No  . Sexual activity: Not Currently    Birth control/protection: Post-menopausal  Other Topics Concern  . Not on file  Social History Narrative  . Not on file   Social Determinants of Health   Financial Resource Strain:   . Difficulty of Paying Living Expenses: Not on file  Food Insecurity:   . Worried About Charity fundraiser in the Last Year: Not on file  . Ran Out of Food in the Last Year: Not on file  Transportation Needs:   . Lack of Transportation (Medical): Not on file  . Lack of Transportation (Non-Medical): Not on file  Physical Activity:   . Days of Exercise per Week: Not on file  . Minutes of Exercise per Session: Not on file  Stress:   . Feeling of Stress : Not on file  Social Connections:   . Frequency of Communication with Friends and Family: Not on file  . Frequency of Social Gatherings with Friends and Family: Not on file  . Attends Religious Services: Not on file  . Active Member of Clubs or  Organizations: Not on file  . Attends Archivist Meetings: Not on file  . Marital Status: Not on file  Intimate Partner Violence:   . Fear of Current or Ex-Partner: Not on file  . Emotionally Abused: Not on file  . Physically Abused: Not on file  . Sexually Abused: Not on file    FAMILY HISTORY: Family History  Problem Relation Age of Onset  . Breast cancer Mother 21  . Hypertension Father   . Cancer Sister        Hodkin's lymphoma    ALLERGIES:  is allergic to cat hair extract, other, oxycodone, and zosyn [piperacillin sod-tazobactam so].  MEDICATIONS:  Current Outpatient Medications  Medication Sig Dispense Refill  . acetaminophen (TYLENOL) 500 MG tablet Take 1,000 mg by mouth every 6 (six) hours as needed for mild pain or fever.     Marland Kitchen albuterol (VENTOLIN HFA)  108 (90 Base) MCG/ACT inhaler Inhale 2 puffs into the lungs every 6 (six) hours as needed for wheezing or shortness of breath.     . clindamycin (CLEOCIN) 150 MG capsule Prior to dental appointments.    . diltiazem (CARDIZEM CD) 240 MG 24 hr capsule     . gabapentin (NEURONTIN) 300 MG capsule Take by mouth.    Marland Kitchen glipiZIDE (GLUCOTROL XL) 2.5 MG 24 hr tablet Take by mouth.    Marland Kitchen lisinopril (PRINIVIL,ZESTRIL) 20 MG tablet Take 20 mg by mouth daily.     Marland Kitchen LORazepam (ATIVAN) 0.5 MG tablet TAKE 1 TABLET BY MOUTH ONCE DAILY AS NEEDED    . metoprolol succinate (TOPROL-XL) 25 MG 24 hr tablet Take by mouth daily. Does not take if BP in good range.    . pantoprazole (PROTONIX) 40 MG tablet Take 40 mg by mouth daily.     . sertraline (ZOLOFT) 100 MG tablet Take 100 mg by mouth daily.     No current facility-administered medications for this visit.     PHYSICAL EXAMINATION: ECOG PERFORMANCE STATUS: 1 - Symptomatic but completely ambulatory Vitals:   05/08/20 1354  BP: (!) 164/94  Pulse: 71  Resp: 18  Temp: 98.6 F (37 C)   Filed Weights   05/08/20 1354  Weight: 231 lb 1.6 oz (104.8 kg)    Physical  Exam Constitutional:      General: She is not in acute distress.    Appearance: She is obese.  HENT:     Head: Normocephalic and atraumatic.  Eyes:     General: No scleral icterus. Cardiovascular:     Rate and Rhythm: Normal rate and regular rhythm.     Heart sounds: Normal heart sounds.  Pulmonary:     Effort: Pulmonary effort is normal. No respiratory distress.     Breath sounds: No wheezing.  Abdominal:     General: Bowel sounds are normal. There is no distension.     Palpations: Abdomen is soft.  Musculoskeletal:        General: No deformity. Normal range of motion.     Cervical back: Normal range of motion and neck supple.  Skin:    General: Skin is warm and dry.     Findings: No erythema or rash.  Neurological:     Mental Status: She is alert and oriented to person, place, and time. Mental status is at baseline.     Cranial Nerves: No cranial nerve deficit.     Coordination: Coordination normal.  Psychiatric:        Mood and Affect: Mood normal.     CMP Latest Ref Rng & Units 05/08/2020  Glucose 65 - 99 mg/dL -  BUN 6 - 20 mg/dL -  Creatinine 0.44 - 1.00 mg/dL -  Sodium 135 - 145 mmol/L -  Potassium 3.5 - 5.1 mmol/L -  Chloride 101 - 111 mmol/L -  CO2 22 - 32 mmol/L -  Calcium 8.9 - 10.3 mg/dL -  Total Protein 6.5 - 8.1 g/dL 7.1  Total Bilirubin 0.3 - 1.2 mg/dL 0.4  Alkaline Phos 38 - 126 U/L 84  AST 15 - 41 U/L 25  ALT 0 - 44 U/L 20   CBC Latest Ref Rng & Units 05/08/2020  WBC 4.0 - 10.5 K/uL 13.3(H)  Hemoglobin 12.0 - 15.0 g/dL 13.2  Hematocrit 36 - 46 % 39.9  Platelets 150 - 400 K/uL 261     RADIOGRAPHIC STUDIES: I have personally reviewed the radiological images  as listed and agreed with the findings in the report. No results found.  LABORATORY DATA:  I have reviewed the data as listed Lab Results  Component Value Date   WBC 13.3 (H) 05/08/2020   HGB 13.2 05/08/2020   HCT 39.9 05/08/2020   MCV 80.3 05/08/2020   PLT 261 05/08/2020   Recent  Labs    05/08/20 1434  PROT 7.1  ALBUMIN 3.9  AST 25  ALT 20  ALKPHOS 84  BILITOT 0.4  BILIDIR <0.1  IBILI NOT CALCULATED   Iron/TIBC/Ferritin/ %Sat    Component Value Date/Time   IRON 58 10/31/2012 1502   TIBC 358 10/31/2012 1502   FERRITIN 108 10/31/2012 1502   IRONPCTSAT 16 10/31/2012 1502        ASSESSMENT & PLAN:  1. Leukocytosis, unspecified type    Labs reviewed and discussed with patient that Leukocytosis, predominantly neutrophilia, can be secondary to infection, chronic inflammation, smoking, autoimmune disease, or underlying bone marrow disorders.   For the work up of patient's leukocytosis, I recommend checking CBC;CMP, LDH, HIV, hepatitis panel, ANA, smear review, flowcytometry, etc.   Orders Placed This Encounter  Procedures  . CBC with Differential/Platelet    Standing Status:   Future    Number of Occurrences:   1    Standing Expiration Date:   05/08/2021  . Lactate dehydrogenase    Standing Status:   Future    Number of Occurrences:   1    Standing Expiration Date:   05/08/2021  . Technologist smear review    Standing Status:   Future    Number of Occurrences:   1    Standing Expiration Date:   05/08/2021  . HIV Antibody (routine testing w rflx)    Standing Status:   Future    Number of Occurrences:   1    Standing Expiration Date:   05/08/2021  . Hepatitis panel, acute    Standing Status:   Future    Number of Occurrences:   1    Standing Expiration Date:   05/08/2021  . ANA w/Reflex    Standing Status:   Future    Number of Occurrences:   1    Standing Expiration Date:   05/08/2021  . Flow cytometry panel-leukemia/lymphoma work-up  . Hepatic function panel    Standing Status:   Future    Number of Occurrences:   1    Standing Expiration Date:   05/08/2021    All questions were answered. The patient knows to call the clinic with any problems questions or concerns.  Return of visit: 2 weeks to discuss labs. Thank you for this kind  referral and the opportunity to participate in the care of this patient. A copy of today's note is routed to referring provider   Earlie Server, MD, PhD Hematology Oncology [facility] Pager- [phone] 05/08/2020

## 2020-05-09 LAB — ANA W/REFLEX: Anti Nuclear Antibody (ANA): NEGATIVE

## 2020-05-09 LAB — HEPATITIS PANEL, ACUTE
HCV Ab: NONREACTIVE
Hep A IgM: NONREACTIVE
Hep B C IgM: NONREACTIVE
Hepatitis B Surface Ag: NONREACTIVE

## 2020-05-13 LAB — COMP PANEL: LEUKEMIA/LYMPHOMA

## 2020-06-02 ENCOUNTER — Telehealth: Payer: BLUE CROSS/BLUE SHIELD | Admitting: Oncology

## 2020-06-03 ENCOUNTER — Encounter: Payer: Self-pay | Admitting: Oncology

## 2020-06-03 ENCOUNTER — Inpatient Hospital Stay: Payer: Medicare Other | Attending: Oncology | Admitting: Oncology

## 2020-06-03 DIAGNOSIS — D72829 Elevated white blood cell count, unspecified: Secondary | ICD-10-CM | POA: Diagnosis not present

## 2020-06-03 NOTE — Progress Notes (Signed)
HEMATOLOGY-ONCOLOGY TeleHEALTH VISIT PROGRESS NOTE  I connected with [name] on @ENCDATE  @ at  2:45 PM EST by video enabled telemedicine visit and verified that I am speaking with the correct person using two identifiers. I discussed the limitations, risks, security and privacy concerns of performing an evaluation and management service by telemedicine and the availability of in-person appointments. The patient expressed understanding and agreed to proceed.   Other persons participating in the visit and their role in the encounter:  None  Patient's location: Home  Provider's location: office Chief Complaint: Leukocytosis   INTERVAL HISTORY [name] is a 64 y.o. female who has above history reviewed by me today presents for follow up visit for management of leukocytosis Problems and complaints are listed below:  During the interval, patient has had blood work done. No new complaints.   Review of Systems  Constitutional: Positive for fatigue. Negative for appetite change, chills and fever.  HENT:   Negative for hearing loss and voice change.   Eyes: Negative for eye problems.  Respiratory: Negative for chest tightness and cough.   Cardiovascular: Negative for chest pain.  Gastrointestinal: Negative for abdominal distention, abdominal pain and blood in stool.  Endocrine: Negative for hot flashes.  Genitourinary: Negative for difficulty urinating and frequency.   Musculoskeletal: Positive for arthralgias and neck pain.  Skin: Negative for itching and rash.  Neurological: Negative for extremity weakness.  Hematological: Negative for adenopathy.  Psychiatric/Behavioral: Negative for confusion.    Past Medical History:  Diagnosis Date  . Anemia   . Anxiety   . Arthritis   . Asthma   . Benign essential tremor   . Claustrophobia   . Depression   . Dysrhythmia   . Fibromyalgia   . Fusion of spine of cervical region   . Gastric ulcer   . GERD (gastroesophageal reflux disease)    . Headache   . Hemorrhoids   . History of chicken pox   . History of motion sickness   . History of pleurisy   . History of seasonal allergies   . Hyperlipidemia   . Hypertension   . Hypothyroidism   . Leukocytosis   . PONV (postoperative nausea and vomiting)   . Radiculopathy of cervical region    Past Surgical History:  Procedure Laterality Date  . ALLOGRAFT APPLICATION    . BREAST BIOPSY Bilateral 1970's  . BREAST BIOPSY Left    stereotactic  . CESAREAN SECTION    . COLONOSCOPY WITH PROPOFOL N/A 04/14/2016   Procedure: COLONOSCOPY WITH PROPOFOL;  Surgeon: Manya Silvas, MD;  Location: [facility] ENDOSCOPY;  Service: Endoscopy;  Laterality: N/A;  . COLONOSCOPY WITH PROPOFOL N/A 02/15/2020   Procedure: COLONOSCOPY WITH PROPOFOL;  Surgeon: Lesly Rubenstein, MD;  Location: [facility] ENDOSCOPY;  Service: Endoscopy;  Laterality: N/A;  . ESOPHAGOGASTRODUODENOSCOPY (EGD) WITH PROPOFOL N/A 04/14/2016   Procedure: ESOPHAGOGASTRODUODENOSCOPY (EGD) WITH PROPOFOL;  Surgeon: Manya Silvas, MD;  Location: [facility] ENDOSCOPY;  Service: Endoscopy;  Laterality: N/A;  . ESOPHAGOGASTRODUODENOSCOPY (EGD) WITH PROPOFOL N/A 02/15/2020   Procedure: ESOPHAGOGASTRODUODENOSCOPY (EGD) WITH PROPOFOL;  Surgeon: Lesly Rubenstein, MD;  Location: [facility] ENDOSCOPY;  Service: Endoscopy;  Laterality: N/A;  . FLEXIBLE SIGMOIDOSCOPY N/A 11/03/2016   Procedure: FLEXIBLE SIGMOIDOSCOPY;  Surgeon: Manya Silvas, MD;  Location: [facility] ENDOSCOPY;  Service: Endoscopy;  Laterality: N/A;  . FLEXIBLE SIGMOIDOSCOPY N/A 01/31/2017   Procedure: FLEXIBLE SIGMOIDOSCOPY;  Surgeon: Manya Silvas, MD;  Location: [facility] ENDOSCOPY;  Service: Endoscopy;  Laterality: N/A;  .  gastric ulcer    . JOINT REPLACEMENT    . LEG TENDON SURGERY    . NERVE SURGERY     on back  . SHOULDER SURGERY    . TONSILLECTOMY    . TOTAL KNEE ARTHROPLASTY      Family History  Problem Relation Age of Onset  . Breast cancer Mother 17  . Hypertension Father    . Cancer Sister        Hodkin's lymphoma    Social History   Socioeconomic History  . Marital status: Married    Spouse name: Not on file  . Number of children: Not on file  . Years of education: Not on file  . Highest education level: Not on file  Occupational History  . Not on file  Tobacco Use  . Smoking status: Former Smoker    Packs/day: 2.00    Years: 35.00    Pack years: 70.00    Quit date: 07/01/2000    Years since quitting: 19.9  . Smokeless tobacco: Never Used  Vaping Use  . Vaping Use: Never used  Substance and Sexual Activity  . Alcohol use: No  . Drug use: No  . Sexual activity: Not Currently    Birth control/protection: Post-menopausal  Other Topics Concern  . Not on file  Social History Narrative  . Not on file   Social Determinants of Health   Financial Resource Strain:   . Difficulty of Paying Living Expenses: Not on file  Food Insecurity:   . Worried About Charity fundraiser in the Last Year: Not on file  . Ran Out of Food in the Last Year: Not on file  Transportation Needs:   . Lack of Transportation (Medical): Not on file  . Lack of Transportation (Non-Medical): Not on file  Physical Activity:   . Days of Exercise per Week: Not on file  . Minutes of Exercise per Session: Not on file  Stress:   . Feeling of Stress : Not on file  Social Connections:   . Frequency of Communication with Friends and Family: Not on file  . Frequency of Social Gatherings with Friends and Family: Not on file  . Attends Religious Services: Not on file  . Active Member of Clubs or Organizations: Not on file  . Attends Archivist Meetings: Not on file  . Marital Status: Not on file  Intimate Partner Violence:   . Fear of Current or Ex-Partner: Not on file  . Emotionally Abused: Not on file  . Physically Abused: Not on file  . Sexually Abused: Not on file    Current Outpatient Medications on File Prior to Visit  Medication Sig Dispense Refill  .  acetaminophen (TYLENOL) 500 MG tablet Take 1,000 mg by mouth every 6 (six) hours as needed for mild pain or fever.     Marland Kitchen albuterol (VENTOLIN HFA) 108 (90 Base) MCG/ACT inhaler Inhale 2 puffs into the lungs every 6 (six) hours as needed for wheezing or shortness of breath.     . diltiazem (CARDIZEM CD) 240 MG 24 hr capsule     . gabapentin (NEURONTIN) 300 MG capsule Take by mouth.    Marland Kitchen glipiZIDE (GLUCOTROL XL) 2.5 MG 24 hr tablet Take by mouth.    Marland Kitchen lisinopril (PRINIVIL,ZESTRIL) 20 MG tablet Take 20 mg by mouth daily.     Marland Kitchen LORazepam (ATIVAN) 0.5 MG tablet TAKE 1 TABLET BY MOUTH ONCE DAILY AS NEEDED    . metoprolol succinate (TOPROL-XL) 25  MG 24 hr tablet Take by mouth daily. Does not take if BP in good range.    . pantoprazole (PROTONIX) 40 MG tablet Take 40 mg by mouth daily.     . sertraline (ZOLOFT) 100 MG tablet Take 100 mg by mouth daily.    . clindamycin (CLEOCIN) 150 MG capsule Prior to dental appointments. (Patient not taking: Reported on 06/03/2020)     No current facility-administered medications on file prior to visit.    Allergies  Allergen Reactions  . Cat Hair Extract Other (See Comments) and Itching    Congestion, watery eyes   . Other Other (See Comments)  . Oxycodone Other (See Comments)    Withdrawal symptoms  . Zosyn [Piperacillin Sod-Tazobactam So] Cough       Observations/Objective: Today's Vitals   06/03/20 1442  PainSc: 0-No pain   There is no height or weight on file to calculate BMI.  Physical Exam Neurological:     Mental Status: She is alert.     CBC    Component Value Date/Time   WBC 13.3 (H) 05/08/2020 1434   RBC 4.97 05/08/2020 1434   HGB 13.2 05/08/2020 1434   HGB 11.8 (L) 11/28/2013 0638   HCT 39.9 05/08/2020 1434   HCT 42.1 11/14/2013 0918   PLT 261 05/08/2020 1434   PLT 257 11/28/2013 0638   MCV 80.3 05/08/2020 1434   MCV 83 11/14/2013 0918   MCH 26.6 05/08/2020 1434   MCHC 33.1 05/08/2020 1434   RDW 14.6 05/08/2020 1434   RDW 14.8  (H) 11/14/2013 0918   LYMPHSABS 4.6 (H) 05/08/2020 1434   LYMPHSABS 5.4 (H) 10/10/2012 1609   MONOABS 0.8 05/08/2020 1434   MONOABS 1.2 (H) 10/10/2012 1609   EOSABS 0.3 05/08/2020 1434   EOSABS 0.3 10/10/2012 1609   BASOSABS 0.1 05/08/2020 1434   BASOSABS 0.1 10/10/2012 1609    CMP     Component Value Date/Time   NA 137 07/13/2016 0406   NA 135 (L) 11/28/2013 0638   K 3.7 07/13/2016 0406   K 3.6 11/28/2013 0638   CL 102 07/13/2016 0406   CL 98 11/28/2013 0638   CO2 27 07/13/2016 0406   CO2 30 11/28/2013 0638   GLUCOSE 207 (H) 07/13/2016 0406   GLUCOSE 166 (H) 11/28/2013 0638   BUN 16 07/13/2016 0406   BUN 12 11/28/2013 0638   CREATININE 0.70 07/12/2017 1105   CREATININE 0.70 11/28/2013 0638   CALCIUM 8.7 (L) 07/13/2016 0406   CALCIUM 7.7 (L) 11/28/2013 0638   PROT 7.1 05/08/2020 1434   PROT 7.6 10/10/2012 1609   ALBUMIN 3.9 05/08/2020 1434   ALBUMIN 3.6 10/10/2012 1609   AST 25 05/08/2020 1434   AST 24 10/10/2012 1609   ALT 20 05/08/2020 1434   ALT 32 10/10/2012 1609   ALKPHOS 84 05/08/2020 1434   ALKPHOS 102 10/10/2012 1609   BILITOT 0.4 05/08/2020 1434   BILITOT 0.3 10/10/2012 1609   GFRNONAA >60 07/13/2016 0406   GFRNONAA >60 11/28/2013 0638   GFRAA >60 07/13/2016 0406   GFRAA >60 11/28/2013 3893     Assessment and Plan: 1. Leukocytosis, unspecified type     Labs reviewed and discussed with patient. Negative HIV, hepatitis panel. Normal LDH, negative ANA, flow cytometry was negative. Given that patient has no constitutional symptoms, work-up is negative, chronic leukocytosis has been stable, I recommend continue to monitor. I will repeat blood work in 6 months. Leukocytosis is most likely reactive. Possible related to obesity.  Follow Up Instructions: 6 months   I discussed the assessment and treatment plan with the patient. The patient was provided an opportunity to ask questions and all were answered. The patient agreed with the plan and demonstrated  an understanding of the instructions.  The patient was advised to call back or seek an in-person evaluation if the symptoms worsen or if the condition fails to improve as anticipated.     Earlie Server, MD 06/03/2020 9:22 PM

## 2020-06-03 NOTE — Progress Notes (Signed)
Patient contacted for Mychart visit. No new concerns voiced.  

## 2020-08-15 ENCOUNTER — Other Ambulatory Visit: Payer: Self-pay | Admitting: Family Medicine

## 2020-08-15 DIAGNOSIS — Z1231 Encounter for screening mammogram for malignant neoplasm of breast: Secondary | ICD-10-CM

## 2020-08-27 ENCOUNTER — Other Ambulatory Visit: Payer: Self-pay

## 2020-08-27 ENCOUNTER — Emergency Department: Payer: Medicare Other

## 2020-08-27 ENCOUNTER — Encounter: Payer: Self-pay | Admitting: Emergency Medicine

## 2020-08-27 ENCOUNTER — Emergency Department
Admission: EM | Admit: 2020-08-27 | Discharge: 2020-08-28 | Disposition: A | Discharge status: Home / Self Care | Payer: Medicare Other | Attending: Emergency Medicine | Admitting: Emergency Medicine

## 2020-08-27 DIAGNOSIS — J45909 Unspecified asthma, uncomplicated: Secondary | ICD-10-CM | POA: Diagnosis not present

## 2020-08-27 DIAGNOSIS — Z96659 Presence of unspecified artificial knee joint: Secondary | ICD-10-CM | POA: Insufficient documentation

## 2020-08-27 DIAGNOSIS — E860 Dehydration: Secondary | ICD-10-CM | POA: Insufficient documentation

## 2020-08-27 DIAGNOSIS — E1165 Type 2 diabetes mellitus with hyperglycemia: Secondary | ICD-10-CM | POA: Diagnosis not present

## 2020-08-27 DIAGNOSIS — R251 Tremor, unspecified: Secondary | ICD-10-CM | POA: Diagnosis not present

## 2020-08-27 DIAGNOSIS — R0602 Shortness of breath: Secondary | ICD-10-CM | POA: Diagnosis not present

## 2020-08-27 DIAGNOSIS — Z79899 Other long term (current) drug therapy: Secondary | ICD-10-CM | POA: Insufficient documentation

## 2020-08-27 DIAGNOSIS — Z7984 Long term (current) use of oral hypoglycemic drugs: Secondary | ICD-10-CM | POA: Insufficient documentation

## 2020-08-27 DIAGNOSIS — I10 Essential (primary) hypertension: Secondary | ICD-10-CM | POA: Diagnosis present

## 2020-08-27 DIAGNOSIS — R000 Tachycardia, unspecified: Secondary | ICD-10-CM | POA: Insufficient documentation

## 2020-08-27 DIAGNOSIS — E039 Hypothyroidism, unspecified: Secondary | ICD-10-CM | POA: Diagnosis not present

## 2020-08-27 DIAGNOSIS — R739 Hyperglycemia, unspecified: Secondary | ICD-10-CM

## 2020-08-27 DIAGNOSIS — Z20822 Contact with and (suspected) exposure to covid-19: Secondary | ICD-10-CM | POA: Insufficient documentation

## 2020-08-27 LAB — BLOOD GAS, VENOUS
Acid-Base Excess: 3.4 mmol/L — ABNORMAL HIGH (ref 0.0–2.0)
Bicarbonate: 28.5 mmol/L — ABNORMAL HIGH (ref 20.0–28.0)
O2 Saturation: 77.4 %
Patient temperature: 37
pCO2, Ven: 44 mmHg (ref 44.0–60.0)
pH, Ven: 7.42 (ref 7.250–7.430)
pO2, Ven: 41 mmHg (ref 32.0–45.0)

## 2020-08-27 LAB — URINALYSIS, COMPLETE (UACMP) WITH MICROSCOPIC
Bacteria, UA: NONE SEEN
Bilirubin Urine: NEGATIVE
Glucose, UA: >=500 mg/dL — AB
Hgb urine dipstick: NEGATIVE
Ketones, ur: NEGATIVE mg/dL
Leukocytes,Ua: NEGATIVE
Nitrite: NEGATIVE
Protein, ur: 30 mg/dL — AB
Specific Gravity, Urine: 1.022 (ref 1.005–1.030)
Squamous Epithelial / HPF: NONE SEEN (ref 0–5)
pH: 5 (ref 5.0–8.0)

## 2020-08-27 LAB — BASIC METABOLIC PANEL
Anion gap: 14 (ref 5–15)
BUN: 17 mg/dL (ref 8–23)
CO2: 24 mmol/L (ref 22–32)
Calcium: 9.2 mg/dL (ref 8.9–10.3)
Chloride: 96 mmol/L — ABNORMAL LOW (ref 98–111)
Creatinine, Ser: 0.91 mg/dL (ref 0.44–1.00)
GFR, Estimated: >60 mL/min (ref >60)
Glucose, Bld: 461 mg/dL — ABNORMAL HIGH (ref 70–99)
Potassium: 3.8 mmol/L (ref 3.5–5.1)
Sodium: 134 mmol/L — ABNORMAL LOW (ref 135–145)

## 2020-08-27 LAB — CBG MONITORING, ED
Glucose-Capillary: 469 mg/dL — ABNORMAL HIGH (ref 70–99)
Glucose-Capillary: 320 mg/dL — ABNORMAL HIGH (ref 70–99)
Glucose-Capillary: 286 mg/dL — ABNORMAL HIGH (ref 70–99)
Glucose-Capillary: 266 mg/dL — ABNORMAL HIGH (ref 70–99)

## 2020-08-27 LAB — CBC
HCT: 42.9 % (ref 36.0–46.0)
Hemoglobin: 14.1 g/dL (ref 12.0–15.0)
MCH: 26.9 pg (ref 26.0–34.0)
MCHC: 32.9 g/dL (ref 30.0–36.0)
MCV: 81.7 fL (ref 80.0–100.0)
Platelets: 301 10*3/uL (ref 150–400)
RBC: 5.25 MIL/uL — ABNORMAL HIGH (ref 3.87–5.11)
RDW: 14.2 % (ref 11.5–15.5)
WBC: 14.2 10*3/uL — ABNORMAL HIGH (ref 4.0–10.5)
nRBC: 0 % (ref 0.0–0.2)

## 2020-08-27 LAB — RESP PANEL BY RT-PCR (FLU A&B, COVID) ARPGX2
Influenza A by PCR: NEGATIVE
Influenza B by PCR: NEGATIVE
SARS Coronavirus 2 by RT PCR: NEGATIVE

## 2020-08-27 LAB — TROPONIN I (HIGH SENSITIVITY)
Troponin I (High Sensitivity): 7 ng/L (ref <18)
Troponin I (High Sensitivity): 8 ng/L (ref <18)

## 2020-08-27 LAB — BETA-HYDROXYBUTYRIC ACID: Beta-Hydroxybutyric Acid: 0.17 mmol/L (ref 0.05–0.27)

## 2020-08-27 MED ORDER — DILTIAZEM HCL ER COATED BEADS 240 MG PO CP24
240.0000 mg | ORAL_CAPSULE | ORAL | Status: AC
  Administered 2020-08-27: 240 mg via ORAL
  Filled 2020-08-27: qty 1

## 2020-08-27 MED ORDER — SODIUM CHLORIDE 0.9 % IV BOLUS
1000.0000 mL | Freq: Once | INTRAVENOUS | Status: AC
  Administered 2020-08-27: 1000 mL via INTRAVENOUS

## 2020-08-27 MED ORDER — INSULIN ASPART 100 UNIT/ML ~~LOC~~ SOLN
0.0000 [IU] | SUBCUTANEOUS | Status: DC
  Administered 2020-08-27 (×2): 8 [IU] via SUBCUTANEOUS
  Filled 2020-08-27 (×2): qty 1

## 2020-08-27 NOTE — ED Triage Notes (Signed)
Pt comes into the ED via POV c/o weakness, hypertension, increased urination, and shakiness.  Pt states her BP was 220's/140's.  Pt brought in by the caregiver that works with her son.  Pt presents shaking.  Pt does explain that she is diabetic and has not been checking her CBG at home.  Pt denies any dizziness or CP.

## 2020-08-27 NOTE — ED Notes (Signed)
Pt provided with snack and water per request, approved by Dr. Creig Hines. Dr Tamala Julian discussed POC with patient. Pt denies other needs at this time.  Awaiting diltiazem from pharmacy.

## 2020-08-27 NOTE — ED Notes (Signed)
This RN messaged pharmacy to send up another dose of cardizem since unable to locate first dose.

## 2020-08-27 NOTE — ED Provider Notes (Signed)
[facility] Emergency Department Provider Note  ____________________________________________   Event Date/Time   First MD Initiated Contact with Patient 08/27/20 2024     (approximate)  I have reviewed the triage vital signs and the nursing notes.   HISTORY  Chief Complaint Hypertension, Shortness of Breath, and Weakness   HPI [name] is a 65 y.o. female with past medical history of HTN, HDL, DM, anxiety, arthritis, anemia, fibromyalgia, depression and hypothyroidism who presents for assessment approximately 1 day of some shaking in her arms associate with some shortness of breath and nausea.  Patient states she has not been taking her diabetes medicines for some time as she was having side effects from these including nausea.  She states she has been taking her blood pressure medicine Cardizem but did not take her dose today.  She denies any actual vomiting or diarrhea or urinary symptoms.  No recent falls or injuries.  No headache, earache, sore throat, rash or extremity pain weakness numbness or tingling.  She denies any EtOH use, also drug use or tobacco abuse.          Past Medical History:  Diagnosis Date  . Anemia   . Anxiety   . Arthritis   . Asthma   . Benign essential tremor   . Claustrophobia   . Depression   . Dysrhythmia   . Fibromyalgia   . Fusion of spine of cervical region   . Gastric ulcer   . GERD (gastroesophageal reflux disease)   . Headache   . Hemorrhoids   . History of chicken pox   . History of motion sickness   . History of pleurisy   . History of seasonal allergies   . Hyperlipidemia   . Hypertension   . Hypothyroidism   . Leukocytosis   . PONV (postoperative nausea and vomiting)   . Radiculopathy of cervical region     Patient Active Problem List   Diagnosis Date Noted  . PMB (postmenopausal bleeding) 11/01/2019  . Mixed incontinence 11/01/2019  . Anemia, unspecified 03/21/2017  . Anxiety and depression  03/21/2017  . Asthma without status asthmaticus 03/21/2017  . Benign essential tremor 03/21/2017  . Claustrophobia 03/21/2017  . Fibromyalgia 03/21/2017  . GERD (gastroesophageal reflux disease) 03/21/2017  . Heart palpitations 03/21/2017  . History of gastric ulcer 03/21/2017  . History of motion sickness 03/21/2017  . Hyperlipidemia 03/21/2017  . Hypertension 03/21/2017  . Hypothyroid 03/21/2017  . Leukocytosis 03/21/2017  . Osteoarthritis 03/21/2017  . PONV (postoperative nausea and vomiting) 03/21/2017  . Seasonal allergies 03/21/2017  . Snores 03/21/2017  . SOB (shortness of breath) 01/19/2017  . Lumbosacral spondylosis without myelopathy 01/11/2017  . Cervical radiculopathy at C8 10/04/2016  . Migraine without aura and without status migrainosus, not intractable 10/04/2016  . Acute nonintractable headache 07/20/2016  . Sepsis ([place]) 07/12/2016  . Chronic bilateral low back pain with bilateral sciatica 04/27/2016  . Sacroiliitis ([place]) 04/27/2016  . Radiculopathy of lumbar region 04/10/2016  . History of fusion of cervical spine 02/23/2016  . Radiculopathy of cervical region 02/23/2016  . Bilateral thoracic back pain 05/07/2014  . Knee joint replacement status 12/06/2013  . Cervical stenosis of spinal canal 11/21/2012  . Neck pain 10/04/2012    Past Surgical History:  Procedure Laterality Date  . ALLOGRAFT APPLICATION    . BREAST BIOPSY Bilateral 1970's  . BREAST BIOPSY Left    stereotactic  . CESAREAN SECTION    . COLONOSCOPY WITH PROPOFOL N/A 04/14/2016  Procedure: COLONOSCOPY WITH PROPOFOL;  Surgeon: Manya Silvas, MD;  Location: [facility] ENDOSCOPY;  Service: Endoscopy;  Laterality: N/A;  . COLONOSCOPY WITH PROPOFOL N/A 02/15/2020   Procedure: COLONOSCOPY WITH PROPOFOL;  Surgeon: Lesly Rubenstein, MD;  Location: [facility] ENDOSCOPY;  Service: Endoscopy;  Laterality: N/A;  . ESOPHAGOGASTRODUODENOSCOPY (EGD) WITH PROPOFOL N/A 04/14/2016   Procedure:  ESOPHAGOGASTRODUODENOSCOPY (EGD) WITH PROPOFOL;  Surgeon: Manya Silvas, MD;  Location[facility] ENDOSCOPY;  Service: Endoscopy;  Laterality: N/A;  . ESOPHAGOGASTRODUODENOSCOPY (EGD) WITH PROPOFOL N/A 02/15/2020   Procedure: ESOPHAGOGASTRODUODENOSCOPY (EGD) WITH PROPOFOL;  Surgeon: Lesly Rubenstein, MD;  Location: [facility] ENDOSCOPY;  Service: Endoscopy;  Laterality: N/A;  . FLEXIBLE SIGMOIDOSCOPY N/A 11/03/2016   Procedure: FLEXIBLE SIGMOIDOSCOPY;  Surgeon: Manya Silvas, MD;  Location: [facility] ENDOSCOPY;  Service: Endoscopy;  Laterality: N/A;  . FLEXIBLE SIGMOIDOSCOPY N/A 01/31/2017   Procedure: FLEXIBLE SIGMOIDOSCOPY;  Surgeon: Manya Silvas, MD;  Location: [facility] ENDOSCOPY;  Service: Endoscopy;  Laterality: N/A;  . gastric ulcer    . JOINT REPLACEMENT    . LEG TENDON SURGERY    . NERVE SURGERY     on back  . SHOULDER SURGERY    . TONSILLECTOMY    . TOTAL KNEE ARTHROPLASTY      Prior to Admission medications   Medication Sig Start Date End Date Taking? Authorizing Provider  acetaminophen (TYLENOL) 500 MG tablet Take 1,000 mg by mouth every 6 (six) hours as needed for mild pain or fever.     [provider]  albuterol (VENTOLIN HFA) 108 (90 Base) MCG/ACT inhaler Inhale 2 puffs into the lungs every 6 (six) hours as needed for wheezing or shortness of breath.  05/01/14   [provider]  clindamycin (CLEOCIN) 150 MG capsule Prior to dental appointments. Patient not taking: Reported on 06/03/2020 09/17/19   [provider]  diltiazem (CARDIZEM CD) 240 MG 24 hr capsule  10/25/19   [provider]  gabapentin (NEURONTIN) 300 MG capsule Take by mouth. 12/05/19 12/04/20  [provider]  glipiZIDE (GLUCOTROL XL) 2.5 MG 24 hr tablet Take by mouth. 04/28/20 04/28/21  [provider]  lisinopril (PRINIVIL,ZESTRIL) 20 MG tablet Take 20 mg by mouth daily.  04/01/15   [provider]  LORazepam (ATIVAN) 0.5 MG tablet TAKE 1 TABLET BY MOUTH ONCE DAILY AS  NEEDED 06/11/19   [provider]  metoprolol succinate (TOPROL-XL) 25 MG 24 hr tablet Take by mouth daily. Does not take if BP in good range. 01/02/19 06/03/20  [provider]  pantoprazole (PROTONIX) 40 MG tablet Take 40 mg by mouth daily.  04/01/15   [provider]  sertraline (ZOLOFT) 100 MG tablet Take 100 mg by mouth daily.    [provider]    Allergies Cat hair extract, Other, Oxycodone, and Zosyn [piperacillin sod-tazobactam so]  Family History  Problem Relation Age of Onset  . Breast cancer Mother 64  . Hypertension Father   . Cancer Sister        Hodkin's lymphoma    Social History Social History   Tobacco Use  . Smoking status: Former Smoker    Packs/day: 2.00    Years: 35.00    Pack years: 70.00    Quit date: 07/01/2000    Years since quitting: 20.1  . Smokeless tobacco: Never Used  Vaping Use  . Vaping Use: Never used  Substance Use Topics  . Alcohol use: No  . Drug use: No    Review of Systems  Review  of Systems  Constitutional: Negative for chills and fever.  HENT: Negative for sore throat.   Eyes: Negative for pain.  Respiratory: Positive for shortness of breath. Negative for cough and stridor.   Cardiovascular: Negative for chest pain.  Gastrointestinal: Positive for nausea. Negative for vomiting.  Genitourinary: Negative for dysuria.  Musculoskeletal: Negative for myalgias.  Skin: Negative for rash.  Neurological: Positive for tremors. Negative for seizures, loss of consciousness and headaches.  Psychiatric/Behavioral: Negative for suicidal ideas.  All other systems reviewed and are negative.     ____________________________________________   PHYSICAL EXAM:  VITAL SIGNS: ED Triage Vitals  Enc Vitals Group     BP 08/27/20 1703 (!) 161/125     Pulse Rate 08/27/20 1703 (!) 104     Resp 08/27/20 1703 20     Temp 08/27/20 1703 98.7 F (37.1 C)     Temp Source 08/27/20 1703 Oral     SpO2 08/27/20 1703 95  %     Weight 08/27/20 1704 227 lb (103 kg)     Height 08/27/20 1704 5\' 4"  (1.626 m)     Head Circumference --      Peak Flow --      Pain Score 08/27/20 1703 0     Pain Loc --      Pain Edu? --      Excl. in [place]? --    Vitals:   08/27/20 2208 08/27/20 2330  BP: (!) 157/93 (!) 160/82  Pulse: 100 93  Resp: 20 18  Temp:    SpO2: 96% 96%   Physical Exam Vitals and nursing note reviewed.  Constitutional:      General: She is not in acute distress.    Appearance: She is well-developed and well-nourished. She is obese.  HENT:     Head: Normocephalic and atraumatic.     Right Ear: External ear normal.     Left Ear: External ear normal.     Nose: Nose normal.     Mouth/Throat:     Mouth: Mucous membranes are dry.  Eyes:     Conjunctiva/sclera: Conjunctivae normal.  Cardiovascular:     Rate and Rhythm: Normal rate and regular rhythm.     Heart sounds: No murmur heard.   Pulmonary:     Effort: Pulmonary effort is normal. No respiratory distress.     Breath sounds: Decreased breath sounds present. No wheezing or rhonchi.  Abdominal:     Palpations: Abdomen is soft.     Tenderness: There is no abdominal tenderness.  Musculoskeletal:        General: No edema.     Cervical back: Neck supple.     Right lower leg: No edema.     Left lower leg: No edema.  Skin:    General: Skin is warm and dry.     Capillary Refill: Capillary refill takes 2 to 3 seconds.  Neurological:     Mental Status: She is alert and oriented to person, place, and time.  Psychiatric:        Mood and Affect: Mood and affect and mood normal.      ____________________________________________   LABS (all labs ordered are listed, but only abnormal results are displayed)  Labs Reviewed  BASIC METABOLIC PANEL - Abnormal; Notable for the following components:      Result Value   Sodium 134 (*)    Chloride 96 (*)    Glucose, Bld 461 (*)    All other components within normal limits  CBC - Abnormal;  Notable for the following components:   WBC 14.2 (*)    RBC 5.25 (*)    All other components within normal limits  BLOOD GAS, VENOUS - Abnormal; Notable for the following components:   Bicarbonate 28.5 (*)    Acid-Base Excess 3.4 (*)    All other components within normal limits  URINALYSIS, COMPLETE (UACMP) WITH MICROSCOPIC - Abnormal; Notable for the following components:   Color, Urine STRAW (*)    APPearance CLEAR (*)    Glucose, UA >=500 (*)    Protein, ur 30 (*)    All other components within normal limits  CBG MONITORING, ED - Abnormal; Notable for the following components:   Glucose-Capillary 469 (*)    All other components within normal limits  CBG MONITORING, ED - Abnormal; Notable for the following components:   Glucose-Capillary 320 (*)    All other components within normal limits  CBG MONITORING, ED - Abnormal; Notable for the following components:   Glucose-Capillary 286 (*)    All other components within normal limits  CBG MONITORING, ED - Abnormal; Notable for the following components:   Glucose-Capillary 266 (*)    All other components within normal limits  RESP PANEL BY RT-PCR (FLU A&B, COVID) ARPGX2  BETA-HYDROXYBUTYRIC ACID  HEMOGLOBIN A1C  TROPONIN I (HIGH SENSITIVITY)  TROPONIN I (HIGH SENSITIVITY)   ____________________________________________  EKG  Sinus tachycardia with a ventricular rate of 105, left axis deviation, unremarkable intervals, no clear evidence of acute ischemia or other significant artifact in aVR, aVL and aVF. ____________________________________________  RADIOLOGY  ED MD interpretation: No focal consolidation, large effusion, significant edema or other clear acute intrathoracic process.  Official radiology report(s): DG Chest 2 View  Result Date: 08/27/2020 CLINICAL DATA:  Weakness, hypertension, polyuria, tremors EXAM: CHEST - 2 VIEW COMPARISON:  01/24/2019 FINDINGS: Frontal and lateral views of the chest demonstrates stable  enlargement of the cardiac silhouette. No airspace disease, effusion, or pneumothorax. No acute bony abnormalities. IMPRESSION: 1. No acute intrathoracic process. Electronically Signed   By: Randa Ngo M.D.   On: 08/27/2020 20:46    ____________________________________________   PROCEDURES  Procedure(s) performed (including Critical Care):  .1-3 Lead EKG Interpretation Performed by: Lucrezia Starch, MD Authorized by: Lucrezia Starch, MD     Interpretation: normal     ECG rate assessment: normal     Rhythm: sinus rhythm     Ectopy: none     Conduction: normal       ____________________________________________   INITIAL IMPRESSION / ASSESSMENT AND PLAN / ED COURSE      Patient presents with above to history exam for assessment of some shortness of breath and tremors that began today.  This is in the setting of her not taking her diabetes medicines for several days and she has not had her blood pressure medicine today.  On arrival she is hypertensive with a BP of 161/124 and tachycardic at 104 with otherwise stable vital signs on room air.   Primary differential includes but is not limited to hypertensive urgency, heart failure, ACS, arrhythmia, PE, pneumonia, bronchitis, metabolic derangements, symptomatic anemia and thyrotoxicosis.  Low suspicion for toxic ingestion and patient does not have any medications that I think she is withdrawing from at this time.  Will ECG has some nonspecific findings given nonelevated troponins x2 low suspicion for ACS or myocarditis.  Chest x-ray has no evidence of acute volume overload or pneumonia.  No evidence of pneumothorax.  In addition  patient does not appear volume overloaded on exam.  Low suspicion for CHF.  CBC shows leukocytosis with WBC count of 14.2 without acute anemia.  BMP remarkable for glucose of 461 without evidence of acidosis or any significant electrolyte or metabolic derangements.  pH is unremarkable as is beta  hydroxybutyrate and there is no other evidence of DKA.  UA has greater than 500 glucose and some protein but no evidence of infection or blood.  Covid is negative.  Patient given insulin IV fluids for some dehydration and uncomplicated hyperglycemia.  She was also given her home dose of Coreg and had improvement in her blood pressure to 157/93 as well as improvement of her heart rate.  D-dimer sent to assess for risk for PE.  Care patient signed evaluated by Dr. Alfred Levins at approximately 2330.  Plan is to follow-up D-dimer and if this is elevated obtain CTA chest.  If it is nonelevated patient will be safe for discharge with plan for outpatient PCP and cardiology follow-up. ____________________________________________   FINAL CLINICAL IMPRESSION(S) / ED DIAGNOSES  Final diagnoses:  Hypertension, unspecified type  Tremor  SOB (shortness of breath)  Hyperglycemia  Dehydration    Medications  insulin aspart (novoLOG) injection 0-15 Units (8 Units Subcutaneous Given 08/27/20 2322)  sodium chloride 0.9 % bolus 1,000 mL (0 mLs Intravenous Stopped 08/27/20 1904)  diltiazem (CARDIZEM CD) 24 hr capsule 240 mg (240 mg Oral Given 08/27/20 2204)     ED Discharge Orders    None       Note:  This document was prepared using Dragon voice recognition software and may include unintentional dictation errors.   Lucrezia Starch, MD 08/27/20 [phone]

## 2020-08-28 DIAGNOSIS — I10 Essential (primary) hypertension: Secondary | ICD-10-CM | POA: Diagnosis not present

## 2020-08-28 LAB — D-DIMER, QUANTITATIVE: D-Dimer, Quant: 0.4 ug/mL-FEU (ref 0.00–0.50)

## 2020-08-28 LAB — CBG MONITORING, ED: Glucose-Capillary: 201 mg/dL — ABNORMAL HIGH (ref 70–99)

## 2020-08-28 LAB — HEMOGLOBIN A1C
Hgb A1c MFr Bld: 10.4 % — ABNORMAL HIGH (ref 4.8–5.6)
Mean Plasma Glucose: 251.78 mg/dL

## 2020-08-28 NOTE — ED Provider Notes (Addendum)
I was asked by Dr. Hulan Saas to follow-up the results of patient's D-dimer which is negative.  Patient is stable for discharge with plan left in place by him.  Patient is comfortable with this plan.  Patient reevaluated with normal work of breathing, normal sats and no complaints at this time.    Alfred Levins, [place], MD 08/28/20 0049   _________________________ 3:34 AM on 08/28/2020 -----------------------------------------  Several attempts to contact patient's family to come to pick her up were unsuccessful. We requested that patient wait in the waiting room for her family. She became very angry and agitated with such request. At that time, RN took her BP which was understandably elevated as patient was very angry. BP had been normal for hours in the ED. We offered to have patient moved to a hallway bed until family could come to get her in the morning since we had a full waiting room and needed the room to see other patients. Patient then told us she was leaving and wanted her dc papers. She would not let us recheck her BP.   Rudene Re, MD 08/28/20 [phone]

## 2020-08-28 NOTE — ED Notes (Signed)
Charge  Nurse made aware that pt did not want to be discharged to lobby since she did not have a ride. Provider made aware of elevated BP, suggested we move pt to lobby to observe her BP and wait till she had a ride. When pt was made aware that we were going to move her to a hallway bed, pt stated she did not want to be in a hallway bed and wanted to leave since d/c paper was already done. Pt refused to sign paperwork. Pt escorted to lobby.

## 2020-08-28 NOTE — ED Notes (Signed)
This RN to bedside to discharge patient. Patient stating, "the other nurse told me that I could wait in here until the morning". This RN explained that patient would need to wait for ride in the waiting area or in sub-waiting in a recliner. Patient became visibly upset and states that she can't sit out in the waiting area with other people. Patient reassured that she would be in a more private area in sub-waiting. Pt states she would like to know how much an ambulance ride home is. Vitals reassessed. Patient bp elevated. Primary RN notified reassessment should be taken.

## 2020-09-09 ENCOUNTER — Other Ambulatory Visit: Payer: Self-pay

## 2020-09-09 ENCOUNTER — Ambulatory Visit
Admission: RE | Admit: 2020-09-09 | Discharge: 2020-09-09 | Disposition: A | Discharge status: Home / Self Care | Payer: Medicare Other | Source: Ambulatory Visit | Attending: Family Medicine | Admitting: Family Medicine

## 2020-09-09 DIAGNOSIS — Z1231 Encounter for screening mammogram for malignant neoplasm of breast: Secondary | ICD-10-CM | POA: Diagnosis present

## 2020-11-28 ENCOUNTER — Inpatient Hospital Stay: Payer: Medicare Other | Attending: Oncology

## 2020-11-28 ENCOUNTER — Inpatient Hospital Stay (HOSPITAL_BASED_OUTPATIENT_CLINIC_OR_DEPARTMENT_OTHER): Payer: Medicare Other | Admitting: Oncology

## 2020-11-28 ENCOUNTER — Encounter: Payer: Self-pay | Admitting: Oncology

## 2020-11-28 ENCOUNTER — Other Ambulatory Visit: Payer: Self-pay

## 2020-11-28 VITALS — BP 146/84 | HR 60 | Temp 96.9°F | Resp 18 | Wt 229.0 lb

## 2020-11-28 DIAGNOSIS — D72820 Lymphocytosis (symptomatic): Secondary | ICD-10-CM | POA: Diagnosis present

## 2020-11-28 DIAGNOSIS — D72829 Elevated white blood cell count, unspecified: Secondary | ICD-10-CM | POA: Diagnosis not present

## 2020-11-28 DIAGNOSIS — I10 Essential (primary) hypertension: Secondary | ICD-10-CM | POA: Insufficient documentation

## 2020-11-28 DIAGNOSIS — E039 Hypothyroidism, unspecified: Secondary | ICD-10-CM | POA: Diagnosis not present

## 2020-11-28 LAB — CBC WITH DIFFERENTIAL/PLATELET
Abs Immature Granulocytes: 0.04 10*3/uL (ref 0.00–0.07)
Basophils Absolute: 0.1 10*3/uL (ref 0.0–0.1)
Basophils Relative: 1 %
Eosinophils Absolute: 0.2 10*3/uL (ref 0.0–0.5)
Eosinophils Relative: 2 %
HCT: 41.6 % (ref 36.0–46.0)
Hemoglobin: 13.4 g/dL (ref 12.0–15.0)
Immature Granulocytes: 0 %
Lymphocytes Relative: 42 %
Lymphs Abs: 4.3 10*3/uL — ABNORMAL HIGH (ref 0.7–4.0)
MCH: 27 pg (ref 26.0–34.0)
MCHC: 32.2 g/dL (ref 30.0–36.0)
MCV: 83.9 fL (ref 80.0–100.0)
Monocytes Absolute: 0.6 10*3/uL (ref 0.1–1.0)
Monocytes Relative: 6 %
Neutro Abs: 5 10*3/uL (ref 1.7–7.7)
Neutrophils Relative %: 49 %
Platelets: 257 10*3/uL (ref 150–400)
RBC: 4.96 MIL/uL (ref 3.87–5.11)
RDW: 14 % (ref 11.5–15.5)
WBC: 10.2 10*3/uL (ref 4.0–10.5)
nRBC: 0 % (ref 0.0–0.2)

## 2020-11-28 LAB — LACTATE DEHYDROGENASE: LDH: 135 U/L (ref 98–192)

## 2020-11-28 LAB — COMPREHENSIVE METABOLIC PANEL
ALT: 17 U/L (ref 0–44)
AST: 20 U/L (ref 15–41)
Albumin: 3.8 g/dL (ref 3.5–5.0)
Alkaline Phosphatase: 87 U/L (ref 38–126)
Anion gap: 13 (ref 5–15)
BUN: 17 mg/dL (ref 8–23)
CO2: 24 mmol/L (ref 22–32)
Calcium: 8.7 mg/dL — ABNORMAL LOW (ref 8.9–10.3)
Chloride: 100 mmol/L (ref 98–111)
Creatinine, Ser: 0.72 mg/dL (ref 0.44–1.00)
GFR, Estimated: >60 mL/min (ref >60)
Glucose, Bld: 166 mg/dL — ABNORMAL HIGH (ref 70–99)
Potassium: 3.8 mmol/L (ref 3.5–5.1)
Sodium: 137 mmol/L (ref 135–145)
Total Bilirubin: 0.4 mg/dL (ref 0.3–1.2)
Total Protein: 7.1 g/dL (ref 6.5–8.1)

## 2020-11-28 NOTE — Progress Notes (Signed)
Hematology/Oncology  Follow up note [facility] [phone] [phone]) [phone]   Patient Care Team: Maryland Pink, MD as PCP - General (Family Medicine)  REFERRING PROVIDER: Maryland Pink, MD  CHIEF COMPLAINTS/REASON FOR VISIT:   leukocytosis  HISTORY OF PRESENTING ILLNESS:  [name] is a  65 y.o.  female with PMH listed below who was referred to me for evaluation of leukocytosis Reviewed patient' recent labs obtained by PCP.   04/22/2020 CBC showed elevated white count of 13.8,predominantly lymphocytosis.  Previous lab records reviewed. Leukocytosis onset of chronic, duration is since 2016 or earlier.   No aggravating or elevated factors. Associated symptoms or signs:  Denies  fever, chills,night sweats. chornic Fatigue. She reports unintentional weight loss.  Per [facility], 11/01/19 Weight was 227, 05/08/2020 weight is 231. No significant change for the past 6 months.  Smoking history: denies History of recent oral steroid use or steroid injection: denies  History of recent infection: denies.  Autoimmune disease history.  Denies.  Chronic neck pain.  She reports that her legs are heavy. She lives at home with husband. 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 Reports stress level is very high.  She reports being seen by Dr.Pandit about 14 years ago. Records are not available in current EMR.   INTERVAL HISTORY [name] is a 65 y.o. female who has above history reviewed by me today presents for follow up visit for leukocytosis Problems and complaints are listed below: Patient reports feeling well. Since last visit.  Patient has had a ER visit due to uncontrolled hypertension.  Blood pressure medication regimen was adjusted and her blood pressure has improved. Otherwise no new complaints.  Appetite is good, denies any constitutional symptoms. Review of Systems  Constitutional: Negative for appetite change, chills, fatigue and fever.  HENT:   Negative for  hearing loss and voice change.   Eyes: Negative for eye problems.  Respiratory: Negative for chest tightness and cough.   Cardiovascular: Negative for chest pain.  Gastrointestinal: Negative for abdominal distention, abdominal pain and blood in stool.  Endocrine: Negative for hot flashes.  Genitourinary: Negative for difficulty urinating and frequency.   Musculoskeletal: Positive for arthralgias. Negative for neck pain.  Skin: Negative for itching and rash.  Neurological: Negative for extremity weakness.  Hematological: Negative for adenopathy.  Psychiatric/Behavioral: Negative for confusion.     MEDICAL HISTORY:  Past Medical History:  Diagnosis Date  . Anemia   . Anxiety   . Arthritis   . Asthma   . Benign essential tremor   . Claustrophobia   . Depression   . Dysrhythmia   . Fibromyalgia   . Fusion of spine of cervical region   . Gastric ulcer   . GERD (gastroesophageal reflux disease)   . Headache   . Hemorrhoids   . History of chicken pox   . History of motion sickness   . History of pleurisy   . History of seasonal allergies   . Hyperlipidemia   . Hypertension   . Hypothyroidism   . Leukocytosis   . PONV (postoperative nausea and vomiting)   . Radiculopathy of cervical region     SURGICAL HISTORY: Past Surgical History:  Procedure Laterality Date  . ALLOGRAFT APPLICATION    . BREAST BIOPSY Bilateral 1970's  . BREAST BIOPSY Left    stereotactic  . CESAREAN SECTION    . COLONOSCOPY WITH PROPOFOL N/A 04/14/2016   Procedure: COLONOSCOPY WITH PROPOFOL;  Surgeon: Manya Silvas, MD;  Location:  [facility];  Service: Endoscopy;  Laterality: N/A;  . COLONOSCOPY WITH PROPOFOL N/A 02/15/2020   Procedure: COLONOSCOPY WITH PROPOFOL;  Surgeon: Lesly Rubenstein, MD;  Location: [facility] ENDOSCOPY;  Service: Endoscopy;  Laterality: N/A;  . ESOPHAGOGASTRODUODENOSCOPY (EGD) WITH PROPOFOL N/A 04/14/2016   Procedure: ESOPHAGOGASTRODUODENOSCOPY (EGD) WITH PROPOFOL;   Surgeon: Manya Silvas, MD;  Location: [facility] ENDOSCOPY;  Service: Endoscopy;  Laterality: N/A;  . ESOPHAGOGASTRODUODENOSCOPY (EGD) WITH PROPOFOL N/A 02/15/2020   Procedure: ESOPHAGOGASTRODUODENOSCOPY (EGD) WITH PROPOFOL;  Surgeon: Lesly Rubenstein, MD;  Location: [facility] ENDOSCOPY;  Service: Endoscopy;  Laterality: N/A;  . FLEXIBLE SIGMOIDOSCOPY N/A 11/03/2016   Procedure: FLEXIBLE SIGMOIDOSCOPY;  Surgeon: Manya Silvas, MD;  Location: [facility];  Service: Endoscopy;  Laterality: N/A;  . FLEXIBLE SIGMOIDOSCOPY N/A 01/31/2017   Procedure: FLEXIBLE SIGMOIDOSCOPY;  Surgeon: Manya Silvas, MD;  Location: [facility] ENDOSCOPY;  Service: Endoscopy;  Laterality: N/A;  . gastric ulcer    . JOINT REPLACEMENT    . LEG TENDON SURGERY    . NERVE SURGERY     on back  . SHOULDER SURGERY    . TONSILLECTOMY    . TOTAL KNEE ARTHROPLASTY      SOCIAL HISTORY: Social History   Socioeconomic History  . Marital status: Married    Spouse name: Not on file  . Number of children: Not on file  . Years of education: Not on file  . Highest education level: Not on file  Occupational History  . Not on file  Tobacco Use  . Smoking status: Former Smoker    Packs/day: 2.00    Years: 35.00    Pack years: 70.00    Quit date: 07/01/2000    Years since quitting: 20.4  . Smokeless tobacco: Never Used  Vaping Use  . Vaping Use: Never used  Substance and Sexual Activity  . Alcohol use: No  . Drug use: No  . Sexual activity: Not Currently    Birth control/protection: Post-menopausal  Other Topics Concern  . Not on file  Social History Narrative  . Not on file   Social Determinants of Health   Financial Resource Strain: Not on file  Food Insecurity: Not on file  Transportation Needs: Not on file  Physical Activity: Not on file  Stress: Not on file  Social Connections: Not on file  Intimate Partner Violence: Not on file    FAMILY HISTORY: Family History  Problem Relation Age of Onset  . Breast  cancer Mother 37  . Hypertension Father   . Cancer Sister        Hodkin's lymphoma    ALLERGIES:  is allergic to cat hair extract, other, oxycodone, and zosyn [piperacillin sod-tazobactam so].  MEDICATIONS:  Current Outpatient Medications  Medication Sig Dispense Refill  . acetaminophen (TYLENOL) 500 MG tablet Take 1,000 mg by mouth every 6 (six) hours as needed for mild pain or fever.     Marland Kitchen albuterol (VENTOLIN HFA) 108 (90 Base) MCG/ACT inhaler Inhale 2 puffs into the lungs every 6 (six) hours as needed for wheezing or shortness of breath.     . citalopram (CELEXA) 10 MG tablet Take 1 tablet by mouth daily.    Marland Kitchen diltiazem (CARDIZEM CD) 240 MG 24 hr capsule     . gabapentin (NEURONTIN) 300 MG capsule Take by mouth.    Marland Kitchen glimepiride (AMARYL) 1 MG tablet Take 0.5 tablets (0.5 mg total) by mouth 2 (two) times daily    . glipiZIDE (GLUCOTROL XL) 2.5 MG 24 hr tablet  Take by mouth.    . insulin glargine (LANTUS) 100 UNIT/ML Solostar Pen Inject 10 Units into the skin daily.    Marland Kitchen LORazepam (ATIVAN) 0.5 MG tablet TAKE 1 TABLET BY MOUTH ONCE DAILY AS NEEDED    . olmesartan (BENICAR) 40 MG tablet Take 1 tablet by mouth 2 (two) times daily.    . pantoprazole (PROTONIX) 40 MG tablet Take 40 mg by mouth daily.     Marland Kitchen spironolactone (ALDACTONE) 25 MG tablet Take 1 tablet by mouth daily.    . clindamycin (CLEOCIN) 150 MG capsule Prior to dental appointments. (Patient not taking: No sig reported)    . lisinopril (PRINIVIL,ZESTRIL) 20 MG tablet Take 20 mg by mouth daily.  (Patient not taking: Reported on 11/28/2020)    . metoprolol succinate (TOPROL-XL) 25 MG 24 hr tablet Take by mouth daily. Does not take if BP in good range. (Patient not taking: Reported on 11/28/2020)    . sertraline (ZOLOFT) 100 MG tablet Take 100 mg by mouth daily. (Patient not taking: Reported on 11/28/2020)     No current facility-administered medications for this visit.     PHYSICAL EXAMINATION: ECOG PERFORMANCE STATUS: 1 -  Symptomatic but completely ambulatory Vitals:   11/28/20 1105  BP: (!) 146/84  Pulse: 60  Resp: 18  Temp: (!) 96.9 F (36.1 C)   Filed Weights   11/28/20 1105  Weight: 229 lb (103.9 kg)    Physical Exam Constitutional:      General: She is not in acute distress.    Appearance: She is obese.  HENT:     Head: Normocephalic and atraumatic.  Eyes:     General: No scleral icterus. Cardiovascular:     Rate and Rhythm: Normal rate and regular rhythm.     Heart sounds: Normal heart sounds.  Pulmonary:     Effort: Pulmonary effort is normal. No respiratory distress.     Breath sounds: No wheezing.  Abdominal:     General: Bowel sounds are normal. There is no distension.     Palpations: Abdomen is soft.  Musculoskeletal:        General: No deformity. Normal range of motion.     Cervical back: Normal range of motion and neck supple.  Skin:    General: Skin is warm and dry.     Findings: No erythema or rash.  Neurological:     Mental Status: She is alert and oriented to person, place, and time. Mental status is at baseline.     Cranial Nerves: No cranial nerve deficit.     Coordination: Coordination normal.  Psychiatric:        Mood and Affect: Mood normal.     CMP Latest Ref Rng & Units 11/28/2020  Glucose 70 - 99 mg/dL 166(H)  BUN 8 - 23 mg/dL 17  Creatinine 0.44 - 1.00 mg/dL 0.72  Sodium 135 - 145 mmol/L 137  Potassium 3.5 - 5.1 mmol/L 3.8  Chloride 98 - 111 mmol/L 100  CO2 22 - 32 mmol/L 24  Calcium 8.9 - 10.3 mg/dL 8.7(L)  Total Protein 6.5 - 8.1 g/dL 7.1  Total Bilirubin 0.3 - 1.2 mg/dL 0.4  Alkaline Phos 38 - 126 U/L 87  AST 15 - 41 U/L 20  ALT 0 - 44 U/L 17   CBC Latest Ref Rng & Units 11/28/2020  WBC 4.0 - 10.5 K/uL 10.2  Hemoglobin 12.0 - 15.0 g/dL 13.4  Hematocrit 36.0 - 46.0 % 41.6  Platelets 150 - 400 K/uL 257  RADIOGRAPHIC STUDIES: I have personally reviewed the radiological images as listed and agreed with the findings in the report. No results  found.  LABORATORY DATA:  I have reviewed the data as listed Lab Results  Component Value Date   WBC 10.2 11/28/2020   HGB 13.4 11/28/2020   HCT 41.6 11/28/2020   MCV 83.9 11/28/2020   PLT 257 11/28/2020   Recent Labs    05/08/20 1434 08/27/20 1707 11/28/20 0955  NA  --  134* 137  K  --  3.8 3.8  CL  --  96* 100  CO2  --  24 24  GLUCOSE  --  461* 166*  BUN  --  17 17  CREATININE  --  0.91 0.72  CALCIUM  --  9.2 8.7*  GFRNONAA  --  >60 >60  PROT 7.1  --  7.1  ALBUMIN 3.9  --  3.8  AST 25  --  20  ALT 20  --  17  ALKPHOS 84  --  87  BILITOT 0.4  --  0.4  BILIDIR <0.1  --   --   IBILI NOT CALCULATED  --   --    Iron/TIBC/Ferritin/ %Sat    Component Value Date/Time   IRON 58 10/31/2012 1502   TIBC 358 10/31/2012 1502   FERRITIN 108 10/31/2012 1502   IRONPCTSAT 16 10/31/2012 1502        ASSESSMENT & PLAN:  1. Leukocytosis, unspecified type    Labs reviewed and discussed with patient Total white count has normalized to 10.2.  Mild lymphocytosis 4.3.  No constitutional symptoms. Leukocytosis likely due to chronic inflammation. Recommend observation.  Follow-up in 1 year.  Orders Placed This Encounter  Procedures  . CBC with Differential/Platelet    Standing Status:   Future    Standing Expiration Date:   11/28/2021  . Comprehensive metabolic panel    Standing Status:   Future    Standing Expiration Date:   11/28/2021    All questions were answered. The patient knows to call the clinic with any problems questions or concerns.   Earlie Server, MD, PhD Hematology Oncology [facility] at [facility] Pager- [phone] 11/28/2020

## 2020-12-01 ENCOUNTER — Other Ambulatory Visit: Payer: BLUE CROSS/BLUE SHIELD

## 2020-12-01 ENCOUNTER — Ambulatory Visit: Payer: BLUE CROSS/BLUE SHIELD | Admitting: Oncology

## 2020-12-11 ENCOUNTER — Other Ambulatory Visit (HOSPITAL_COMMUNITY): Payer: Self-pay | Admitting: Nephrology

## 2020-12-11 ENCOUNTER — Other Ambulatory Visit: Payer: Self-pay | Admitting: Nephrology

## 2020-12-11 DIAGNOSIS — R809 Proteinuria, unspecified: Secondary | ICD-10-CM

## 2020-12-11 DIAGNOSIS — I10 Essential (primary) hypertension: Secondary | ICD-10-CM

## 2020-12-11 DIAGNOSIS — N189 Chronic kidney disease, unspecified: Secondary | ICD-10-CM

## 2020-12-12 ENCOUNTER — Other Ambulatory Visit: Payer: Self-pay

## 2020-12-12 ENCOUNTER — Ambulatory Visit
Admission: RE | Admit: 2020-12-12 | Discharge: 2020-12-12 | Disposition: A | Discharge status: Home / Self Care | Payer: Medicare Other | Source: Ambulatory Visit | Attending: Nephrology | Admitting: Nephrology

## 2020-12-12 DIAGNOSIS — N189 Chronic kidney disease, unspecified: Secondary | ICD-10-CM | POA: Insufficient documentation

## 2020-12-12 DIAGNOSIS — I10 Essential (primary) hypertension: Secondary | ICD-10-CM | POA: Insufficient documentation

## 2020-12-12 DIAGNOSIS — R809 Proteinuria, unspecified: Secondary | ICD-10-CM

## 2021-04-10 DIAGNOSIS — Z23 Encounter for immunization: Secondary | ICD-10-CM | POA: Diagnosis not present

## 2021-10-21 ENCOUNTER — Other Ambulatory Visit: Payer: Self-pay | Admitting: Family Medicine

## 2021-10-21 DIAGNOSIS — R29898 Other symptoms and signs involving the musculoskeletal system: Secondary | ICD-10-CM

## 2021-10-21 DIAGNOSIS — M545 Low back pain, unspecified: Secondary | ICD-10-CM

## 2021-10-21 DIAGNOSIS — M79604 Pain in right leg: Secondary | ICD-10-CM

## 2021-10-21 DIAGNOSIS — M48062 Spinal stenosis, lumbar region with neurogenic claudication: Secondary | ICD-10-CM

## 2021-10-21 DIAGNOSIS — R2689 Other abnormalities of gait and mobility: Secondary | ICD-10-CM

## 2021-10-26 ENCOUNTER — Other Ambulatory Visit: Payer: Self-pay | Admitting: Family Medicine

## 2021-11-05 ENCOUNTER — Emergency Department
Admission: EM | Admit: 2021-11-05 | Discharge: 2021-11-05 | Disposition: A | Discharge status: Home / Self Care | Payer: Medicare Other | Attending: Emergency Medicine | Admitting: Emergency Medicine

## 2021-11-05 ENCOUNTER — Emergency Department: Payer: Medicare Other

## 2021-11-05 ENCOUNTER — Other Ambulatory Visit: Payer: Self-pay

## 2021-11-05 DIAGNOSIS — M25562 Pain in left knee: Secondary | ICD-10-CM | POA: Diagnosis present

## 2021-11-05 DIAGNOSIS — I10 Essential (primary) hypertension: Secondary | ICD-10-CM | POA: Diagnosis not present

## 2021-11-05 DIAGNOSIS — E039 Hypothyroidism, unspecified: Secondary | ICD-10-CM | POA: Insufficient documentation

## 2021-11-05 MED ORDER — MELOXICAM 15 MG PO TABS: 15.0000 mg | ORAL_TABLET | Freq: Every day | ORAL | 2 refills | Status: DC

## 2021-11-05 MED ORDER — MELOXICAM 7.5 MG PO TABS
15.0000 mg | ORAL_TABLET | Freq: Every day | ORAL | Status: DC
Start: 2021-11-05 — End: 2021-11-05
  Administered 2021-11-05: 15 mg via ORAL
  Filled 2021-11-05: qty 2

## 2021-11-05 NOTE — ED Triage Notes (Signed)
Pt sent from [facility] with concerns of DVT, states she has a hx of left knee pain with a hx of arthritis but worse then before in the past week with pain behind the knee ?

## 2021-11-05 NOTE — ED Provider Notes (Signed)
? ?[facility] ?Provider Note ? ? ? Event Date/Time  ? First MD Initiated Contact with Patient 11/05/21 1530   ?  (approximate) ? ? ?History  ? ?Knee Pain ? ? ?HPI ? ?[name] is a 66 y.o. female with history of hypertension, hypothyroidism, essential tremor, fibromyalgia amongst other medical problems presents emergency department complaining of pain behind her left knee.  No redness or swelling in her lower leg or foot.  Patient's had no chest pain or shortness of breath.  Symptoms have been ongoing for several days.  Patient states that [facility] clinic sent her here to rule out DVT. ? ?  ? ? ?Physical Exam  ? ?Triage Vital Signs: ?ED Triage Vitals  ?Enc Vitals Group  ?   BP 11/05/21 1400 (!) 177/91  ?   Pulse Rate 11/05/21 1400 72  ?   Resp 11/05/21 1400 17  ?   Temp 11/05/21 1400 99 ?F (37.2 ?C)  ?   Temp Source 11/05/21 1400 Oral  ?   SpO2 11/05/21 1400 97 %  ?   Weight 11/05/21 1401 230 lb (104.3 kg)  ?   Height 11/05/21 1401 '5\' 4"'$  (1.626 m)  ?   Head Circumference --   ?   Peak Flow --   ?   Pain Score 11/05/21 1401 9  ?   Pain Loc --   ?   Pain Edu? --   ?   Excl. in [place]? --   ? ? ?Most recent vital signs: ?Vitals:  ? 11/05/21 1400  ?BP: (!) 177/91  ?Pulse: 72  ?Resp: 17  ?Temp: 99 ?F (37.2 ?C)  ?SpO2: 97%  ? ? ? ?General: Awake, no distress.   ?CV:  Good peripheral perfusion. regular rate and  rhythm ?Resp:  Normal effort.  ?Abd:  No distention.   ?Other:  Left knee is tender, swelling noted posteriorly more consistent with a Baker's cyst than a DVT, neurovascular is intact ? ? ?ED Results / Procedures / Treatments  ? ?Labs ?(all labs ordered are listed, but only abnormal results are displayed) ?Labs Reviewed - No data to display ? ? ?EKG ? ? ? ? ?RADIOLOGY ?Ultrasound of the left lower extremity for DVT ? ? ? ?PROCEDURES: ? ? ?Procedures ? ? ?MEDICATIONS ORDERED IN ED: ?Medications  ?meloxicam (MOBIC) tablet 15 mg (has no administration in time range)  ? ? ? ?IMPRESSION / MDM /  ASSESSMENT AND PLAN / ED COURSE  ?I reviewed the triage vital signs and the nursing notes. ?             ?               ? ?Differential diagnosis includes, but is not limited to, DVT, Baker's cyst, ACL tear, meniscal tear, arthritis ? ?Venous ultrasound for left lower extremity was interpreted as being negative for DVT by me, confirmed by radiology ? ?I did explain these findings to the patient.  She was placed in Ace wrap and given a walker so she will not bear as much weight on the left leg.  Explained her she needs to see orthopedics and have them evaluate her for a Baker's cyst.  She can return emergency department if she is worsening.  She was given a prescription for meloxicam to decrease inflammation and pain.  She was also instructed to apply ice.  Patient is in agreement with her treatment plan.  She was discharged stable condition.  I did offer to  do x-rays for her but patient is ready to leave and states she can have her doctor do x-rays. ? ? ? ? ?  ? ? ?FINAL CLINICAL IMPRESSION(S) / ED DIAGNOSES  ? ?Final diagnoses:  ?Acute pain of left knee  ? ? ? ?Rx / DC Orders  ? ?ED Discharge Orders   ? ?      Ordered  ?  meloxicam (MOBIC) 15 MG tablet  Daily       ? 11/05/21 1601  ? ?  ?  ? ?  ? ? ? ?Note:  This document was prepared using Dragon voice recognition software and may include unintentional dictation errors. ? ?  ?Versie Starks, PA-C ?11/05/21 1730 ? ?  ?Blake Divine, MD ?11/07/21 0845 ? ?

## 2021-11-05 NOTE — ED Provider Triage Note (Signed)
Emergency Medicine Provider Triage Evaluation Note ? ?[name] , a 65 y.o. female  was evaluated in triage.  Pt complains of left posterior knee pain. ? ?Review of Systems  ?Positive: As above ?Negative: No significant leg swelling ? ?Physical Exam  ?LMP  (LMP Unknown)  ?Gen:   Awake, no distress   ?Resp:  Normal effort  ?MSK:   Moves extremities without difficulty, mild tenderness posterior left knee, no evidence of infection ?Other:   ? ?Medical Decision Making  ?Medically screening exam initiated at 2:00 PM.  Appropriate orders placed.  [name] was informed that the remainder of the evaluation will be completed by another provider, this initial triage assessment does not replace that evaluation, and the importance of remaining in the ED until their evaluation is complete. ? ? ?  ?Lavonia Drafts, MD ?11/05/21 1400 ? ?

## 2021-11-05 NOTE — Discharge Instructions (Signed)
Apply ice do not use heat.  Wear the Ace wrap for extra support.  Use the walker to will not bear weight on the right leg.  Return if worsening.  Follow-up with [facility]. ?

## 2021-11-09 ENCOUNTER — Ambulatory Visit
Admission: RE | Admit: 2021-11-09 | Discharge: 2021-11-09 | Disposition: A | Discharge status: Home / Self Care | Payer: Medicare Other | Source: Ambulatory Visit | Attending: Family Medicine | Admitting: Family Medicine

## 2021-11-09 ENCOUNTER — Other Ambulatory Visit: Payer: Self-pay | Admitting: Orthopedic Surgery

## 2021-11-09 DIAGNOSIS — R2689 Other abnormalities of gait and mobility: Secondary | ICD-10-CM | POA: Diagnosis present

## 2021-11-09 DIAGNOSIS — R29898 Other symptoms and signs involving the musculoskeletal system: Secondary | ICD-10-CM | POA: Diagnosis present

## 2021-11-09 DIAGNOSIS — M79604 Pain in right leg: Secondary | ICD-10-CM

## 2021-11-09 DIAGNOSIS — M545 Low back pain, unspecified: Secondary | ICD-10-CM | POA: Diagnosis present

## 2021-11-09 DIAGNOSIS — M48062 Spinal stenosis, lumbar region with neurogenic claudication: Secondary | ICD-10-CM

## 2021-11-09 DIAGNOSIS — M25562 Pain in left knee: Secondary | ICD-10-CM

## 2021-11-09 DIAGNOSIS — M79605 Pain in left leg: Secondary | ICD-10-CM | POA: Insufficient documentation

## 2021-11-11 ENCOUNTER — Ambulatory Visit
Admission: RE | Admit: 2021-11-11 | Discharge: 2021-11-11 | Disposition: A | Discharge status: Home / Self Care | Payer: Medicare Other | Source: Ambulatory Visit | Attending: Orthopedic Surgery | Admitting: Orthopedic Surgery

## 2021-11-11 DIAGNOSIS — M25562 Pain in left knee: Secondary | ICD-10-CM | POA: Diagnosis present

## 2021-11-27 ENCOUNTER — Encounter: Payer: Self-pay | Admitting: Oncology

## 2021-11-27 ENCOUNTER — Inpatient Hospital Stay (HOSPITAL_BASED_OUTPATIENT_CLINIC_OR_DEPARTMENT_OTHER): Payer: Medicare Other | Admitting: Oncology

## 2021-11-27 ENCOUNTER — Inpatient Hospital Stay: Payer: Medicare Other | Attending: Oncology

## 2021-11-27 VITALS — BP 113/82 | HR 69 | Temp 97.8°F | Resp 18 | Wt 232.8 lb

## 2021-11-27 DIAGNOSIS — D72829 Elevated white blood cell count, unspecified: Secondary | ICD-10-CM | POA: Insufficient documentation

## 2021-11-27 DIAGNOSIS — Z87891 Personal history of nicotine dependence: Secondary | ICD-10-CM | POA: Insufficient documentation

## 2021-11-27 DIAGNOSIS — Z79899 Other long term (current) drug therapy: Secondary | ICD-10-CM | POA: Insufficient documentation

## 2021-11-27 LAB — CBC WITH DIFFERENTIAL/PLATELET
Abs Immature Granulocytes: 0.09 10*3/uL — ABNORMAL HIGH (ref 0.00–0.07)
Basophils Absolute: 0.1 10*3/uL (ref 0.0–0.1)
Basophils Relative: 1 %
Eosinophils Absolute: 0.2 10*3/uL (ref 0.0–0.5)
Eosinophils Relative: 1 %
HCT: 43.4 % (ref 36.0–46.0)
Hemoglobin: 13.8 g/dL (ref 12.0–15.0)
Immature Granulocytes: 1 %
Lymphocytes Relative: 34 %
Lymphs Abs: 4.6 10*3/uL — ABNORMAL HIGH (ref 0.7–4.0)
MCH: 26.8 pg (ref 26.0–34.0)
MCHC: 31.8 g/dL (ref 30.0–36.0)
MCV: 84.3 fL (ref 80.0–100.0)
Monocytes Absolute: 0.8 10*3/uL (ref 0.1–1.0)
Monocytes Relative: 6 %
Neutro Abs: 7.9 10*3/uL — ABNORMAL HIGH (ref 1.7–7.7)
Neutrophils Relative %: 57 %
Platelets: 283 10*3/uL (ref 150–400)
RBC: 5.15 MIL/uL — ABNORMAL HIGH (ref 3.87–5.11)
RDW: 14.7 % (ref 11.5–15.5)
WBC: 13.6 10*3/uL — ABNORMAL HIGH (ref 4.0–10.5)
nRBC: 0 % (ref 0.0–0.2)

## 2021-11-27 LAB — COMPREHENSIVE METABOLIC PANEL
ALT: 19 U/L (ref 0–44)
AST: 20 U/L (ref 15–41)
Albumin: 3.6 g/dL (ref 3.5–5.0)
Alkaline Phosphatase: 91 U/L (ref 38–126)
Anion gap: 10 (ref 5–15)
BUN: 13 mg/dL (ref 8–23)
CO2: 29 mmol/L (ref 22–32)
Calcium: 8.6 mg/dL — ABNORMAL LOW (ref 8.9–10.3)
Chloride: 99 mmol/L (ref 98–111)
Creatinine, Ser: 0.68 mg/dL (ref 0.44–1.00)
GFR, Estimated: >60 mL/min (ref >60)
Glucose, Bld: 159 mg/dL — ABNORMAL HIGH (ref 70–99)
Potassium: 3.8 mmol/L (ref 3.5–5.1)
Sodium: 138 mmol/L (ref 135–145)
Total Bilirubin: 0.4 mg/dL (ref 0.3–1.2)
Total Protein: 7.5 g/dL (ref 6.5–8.1)

## 2021-11-27 NOTE — Progress Notes (Signed)
Hematology/Oncology Progress note [phone] [phone]      Patient Care Team: Maryland Pink, MD as PCP - General (Family Medicine)  REFERRING PROVIDER: Maryland Pink, MD  CHIEF COMPLAINTS/REASON FOR VISIT:   leukocytosis  HISTORY OF PRESENTING ILLNESS:  [name] is a  66 y.o.  female with PMH listed below who was referred to me for evaluation of leukocytosis Reviewed patient' recent labs obtained by PCP.   04/22/2020 CBC showed elevated white count of 13.8,predominantly lymphocytosis.  Previous lab records reviewed. Leukocytosis onset of chronic, duration is since 2016 or earlier.   No aggravating or elevated factors. Associated symptoms or signs:  Denies  fever, chills,night sweats. chornic Fatigue. She reports unintentional weight loss.  Per [facility], 11/01/19 Weight was 227, 05/08/2020 weight is 231. No significant change for the past 6 months.  Smoking history: denies History of recent oral steroid use or steroid injection: denies  History of recent infection: denies.  Autoimmune disease history.  Denies.  Chronic neck pain.  She reports that her legs are heavy. She lives at home with husband. 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 Reports stress level is very high.  She reports being seen by Dr.Pandit about 14 years ago. Records are not available in current EMR.   INTERVAL HISTORY [name] is a 66 y.o. female who has above history reviewed by me today presents for follow up visit for leukocytosis Patient reports feeling well.  She denies any unintentional weight loss, night sweats, fever. She recently had left MRI knee without contrast which showed complex media meniscus tear. 10/15/2021, patient had a blood work done obtained by primary care provider Dr. Kary Kos. ESR 33, uric acid 6.8.  She also has CRP testing last year which was also elevated.    Review of Systems  Constitutional:  Negative for appetite change, chills, fatigue and fever.  HENT:    Negative for hearing loss and voice change.   Eyes:  Negative for eye problems.  Respiratory:  Negative for chest tightness and cough.   Cardiovascular:  Negative for chest pain.  Gastrointestinal:  Negative for abdominal distention, abdominal pain and blood in stool.  Endocrine: Negative for hot flashes.  Genitourinary:  Negative for difficulty urinating and frequency.   Musculoskeletal:  Positive for arthralgias. Negative for neck pain.  Skin:  Negative for itching and rash.  Neurological:  Negative for extremity weakness.  Hematological:  Negative for adenopathy.  Psychiatric/Behavioral:  Negative for confusion.     MEDICAL HISTORY:  Past Medical History:  Diagnosis Date   Anemia    Anxiety    Arthritis    Asthma    Benign essential tremor    Claustrophobia    Depression    Dysrhythmia    Fibromyalgia    Fusion of spine of cervical region    Gastric ulcer    GERD (gastroesophageal reflux disease)    Headache    Hemorrhoids    History of chicken pox    History of motion sickness    History of pleurisy    History of seasonal allergies    Hyperlipidemia    Hypertension    Hypothyroidism    Leukocytosis    PONV (postoperative nausea and vomiting)    Radiculopathy of cervical region     SURGICAL HISTORY: Past Surgical History:  Procedure Laterality Date   ALLOGRAFT APPLICATION     BREAST BIOPSY Bilateral 1970's   BREAST BIOPSY Left    stereotactic   CESAREAN SECTION  COLONOSCOPY WITH PROPOFOL N/A 04/14/2016   Procedure: COLONOSCOPY WITH PROPOFOL;  Surgeon: Manya Silvas, MD;  Location: [facility] ENDOSCOPY;  Service: Endoscopy;  Laterality: N/A;   COLONOSCOPY WITH PROPOFOL N/A 02/15/2020   Procedure: COLONOSCOPY WITH PROPOFOL;  Surgeon: Lesly Rubenstein, MD;  Location: [facility] ENDOSCOPY;  Service: Endoscopy;  Laterality: N/A;   ESOPHAGOGASTRODUODENOSCOPY (EGD) WITH PROPOFOL N/A 04/14/2016   Procedure: ESOPHAGOGASTRODUODENOSCOPY (EGD) WITH PROPOFOL;  Surgeon:  Manya Silvas, MD;  Location: [facility] ENDOSCOPY;  Service: Endoscopy;  Laterality: N/A;   ESOPHAGOGASTRODUODENOSCOPY (EGD) WITH PROPOFOL N/A 02/15/2020   Procedure: ESOPHAGOGASTRODUODENOSCOPY (EGD) WITH PROPOFOL;  Surgeon: Lesly Rubenstein, MD;  Location: [facility] ENDOSCOPY;  Service: Endoscopy;  Laterality: N/A;   FLEXIBLE SIGMOIDOSCOPY N/A 11/03/2016   Procedure: FLEXIBLE SIGMOIDOSCOPY;  Surgeon: Manya Silvas, MD;  Location: [facility] ENDOSCOPY;  Service: Endoscopy;  Laterality: N/A;   FLEXIBLE SIGMOIDOSCOPY N/A 01/31/2017   Procedure: FLEXIBLE SIGMOIDOSCOPY;  Surgeon: Manya Silvas, MD;  Location: [facility] ENDOSCOPY;  Service: Endoscopy;  Laterality: N/A;   gastric ulcer     JOINT REPLACEMENT     LEG TENDON SURGERY     NERVE SURGERY     on back   SHOULDER SURGERY     TONSILLECTOMY     TOTAL KNEE ARTHROPLASTY      SOCIAL HISTORY: Social History   Socioeconomic History   Marital status: Married    Spouse name: Not on file   Number of children: Not on file   Years of education: Not on file   Highest education level: Not on file  Occupational History   Not on file  Tobacco Use   Smoking status: Former    Packs/day: 2.00    Years: 35.00    Pack years: 70.00    Types: Cigarettes    Quit date: 07/01/2000    Years since quitting: 21.4   Smokeless tobacco: Never  Vaping Use   Vaping Use: Never used  Substance and Sexual Activity   Alcohol use: No   Drug use: No   Sexual activity: Not Currently    Birth control/protection: Post-menopausal  Other Topics Concern   Not on file  Social History Narrative   Not on file   Social Determinants of Health   Financial Resource Strain: Not on file  Food Insecurity: Not on file  Transportation Needs: Not on file  Physical Activity: Not on file  Stress: Not on file  Social Connections: Not on file  Intimate Partner Violence: Not on file    FAMILY HISTORY: Family History  Problem Relation Age of Onset   Breast cancer Mother 73    Hypertension Father    Cancer Sister        Hodkin's lymphoma    ALLERGIES:  is allergic to cat hair extract, other, oxycodone, and zosyn [piperacillin sod-tazobactam so].  MEDICATIONS:  Current Outpatient Medications  Medication Sig Dispense Refill   acetaminophen (TYLENOL) 500 MG tablet Take 1,000 mg by mouth every 6 (six) hours as needed for mild pain or fever.      albuterol (VENTOLIN HFA) 108 (90 Base) MCG/ACT inhaler Inhale 2 puffs into the lungs every 6 (six) hours as needed for wheezing or shortness of breath.      amLODipine (NORVASC) 10 MG tablet Take by mouth.     citalopram (CELEXA) 10 MG tablet Take 1 tablet by mouth daily.     gabapentin (NEURONTIN) 300 MG capsule Take 300 mg by mouth as needed.     glipiZIDE (GLUCOTROL XL) 2.5  MG 24 hr tablet Take by mouth.     insulin glargine (LANTUS) 100 UNIT/ML Solostar Pen Inject 10 Units into the skin daily.     meloxicam (MOBIC) 15 MG tablet Take 1 tablet (15 mg total) by mouth daily. 30 tablet 2   olmesartan (BENICAR) 40 MG tablet Take 1 tablet by mouth 2 (two) times daily.     pantoprazole (PROTONIX) 40 MG tablet Take 40 mg by mouth daily.      sertraline (ZOLOFT) 100 MG tablet Take 100 mg by mouth daily.     spironolactone (ALDACTONE) 25 MG tablet Take 1 tablet by mouth daily.     clindamycin (CLEOCIN) 150 MG capsule Prior to dental appointments. (Patient not taking: Reported on 06/03/2020)     LORazepam (ATIVAN) 0.5 MG tablet TAKE 1 TABLET BY MOUTH ONCE DAILY AS NEEDED (Patient not taking: Reported on 11/27/2021)     No current facility-administered medications for this visit.     PHYSICAL EXAMINATION: ECOG PERFORMANCE STATUS: 1 - Symptomatic but completely ambulatory Vitals:   11/27/21 1122  BP: 113/82  Pulse: 69  Resp: 18  Temp: 97.8 F (36.6 C)   Filed Weights   11/27/21 1122  Weight: 232 lb 12.8 oz (105.6 kg)    Physical Exam Constitutional:      General: She is not in acute distress.    Appearance: She is  obese.  HENT:     Head: Normocephalic and atraumatic.  Eyes:     General: No scleral icterus. Cardiovascular:     Rate and Rhythm: Normal rate and regular rhythm.     Heart sounds: Normal heart sounds.  Pulmonary:     Effort: Pulmonary effort is normal. No respiratory distress.     Breath sounds: No wheezing.  Abdominal:     General: Bowel sounds are normal. There is no distension.     Palpations: Abdomen is soft.  Musculoskeletal:        General: No deformity. Normal range of motion.     Cervical back: Normal range of motion and neck supple.  Skin:    General: Skin is warm and dry.     Findings: No erythema or rash.  Neurological:     Mental Status: She is alert and oriented to person, place, and time. Mental status is at baseline.     Cranial Nerves: No cranial nerve deficit.     Coordination: Coordination normal.  Psychiatric:        Mood and Affect: Mood normal.       Latest Ref Rng & Units 11/27/2021   11:10 AM  CMP  Glucose 70 - 99 mg/dL 159    BUN 8 - 23 mg/dL 13    Creatinine 0.44 - 1.00 mg/dL 0.68    Sodium 135 - 145 mmol/L 138    Potassium 3.5 - 5.1 mmol/L 3.8    Chloride 98 - 111 mmol/L 99    CO2 22 - 32 mmol/L 29    Calcium 8.9 - 10.3 mg/dL 8.6    Total Protein 6.5 - 8.1 g/dL 7.5    Total Bilirubin 0.3 - 1.2 mg/dL 0.4    Alkaline Phos 38 - 126 U/L 91    AST 15 - 41 U/L 20    ALT 0 - 44 U/L 19        Latest Ref Rng & Units 11/27/2021   11:10 AM  CBC  WBC 4.0 - 10.5 K/uL 13.6    Hemoglobin 12.0 - 15.0 g/dL 13.8  Hematocrit 36.0 - 46.0 % 43.4    Platelets 150 - 400 K/uL 283       RADIOGRAPHIC STUDIES: I have personally reviewed the radiological images as listed and agreed with the findings in the report. MR LUMBAR SPINE WO CONTRAST  Result Date: 11/10/2021 CLINICAL DATA:  History of lumbar spinal stenosis common bilateral leg weakness and pain EXAM: MRI LUMBAR SPINE WITHOUT CONTRAST TECHNIQUE: Multiplanar, multisequence MR imaging of the lumbar  spine was performed. No intravenous contrast was administered. COMPARISON:  Lumbar spine MRI 11/13/2009 FINDINGS: Segmentation: Standard; the lowest formed disc space is designated L5-S1. Alignment: There is trace retrolisthesis of L2 on L3 and L3 on L4, similar to 2011. There is no other antero or retrolisthesis. Vertebrae: Vertebral body heights are preserved. Background marrow signal is normal. There is mild degenerative endplate marrow signal abnormality at L2-L3 and L5-S1. There is no suspicious marrow signal abnormality or marrow edema. Conus medullaris and cauda equina: Conus extends to the L1 level. Conus and cauda equina appear normal. Paraspinal and other soft tissues: T2 hyperintense lesions in the kidneys likely reflects cysts, for which no specific imaging follow-up is required. The paraspinal soft tissues are unremarkable. Disc levels: There is disc desiccation and narrowing in the lower lumbar spine, most advanced at L2-L3, progressed since 2011. T11-T12: There is a broad-based central protrusion without significant spinal canal or neural foraminal stenosis, new since 2011. T12-L1: There is a minimal central protrusion without significant spinal canal or neural foraminal stenosis L1-L2: No significant spinal canal or neural foraminal stenosis L2-L3: There is a mild disc bulge and mild bilateral facet arthropathy resulting in mild spinal canal stenosis without significant neural foraminal stenosis, similar to 2011 L3-L4: There is a diffuse disc bulge eccentric to the left and mild bilateral facet arthropathy resulting in mild spinal canal stenosis with narrowing of the subarticular zones but no evidence of frank nerve root impingement, and no significant neural foraminal stenosis, similar to 2011. L4-L5: There is a mild diffuse disc bulge and bilateral facet arthropathy resulting in mild spinal canal stenosis with narrowing of the subarticular zones, left more than right, with possible irritation of  the traversing left L4 nerve root, and no significant neural foraminal stenosis. Findings are not significantly changed since 2011. L5-S1: There is a disc bulge eccentric to the left and mild bilateral facet arthropathy resulting in moderate left neural foraminal stenosis with possible irritation of the exiting L5 nerve root (5-13). There is no significant spinal canal or right neural foraminal stenosis. IMPRESSION: 1. Multilevel degenerative changes throughout the lumbar spine as above with mild spinal canal and subarticular zone narrowing at L3-L4 and L4-L5 without evidence of frank nerve root impingement, and moderate left neural foraminal stenosis at L5-S1 with possible irritation of the exiting L5 nerve root. This left foraminal stenosis at L5-S1 is progressed since 2011, while the other findings are not significantly changed. 2. No other significant spinal canal or neural foraminal stenosis, and no other evidence of nerve root impingement. Electronically Signed   By: Valetta Mole M.D.   On: 11/10/2021 15:22   MR KNEE LEFT WO CONTRAST  Result Date: 11/12/2021 CLINICAL DATA:  Left knee pain and swelling. EXAM: MRI OF THE LEFT KNEE WITHOUT CONTRAST TECHNIQUE: Multiplanar, multisequence MR imaging of the knee was performed. No intravenous contrast was administered. COMPARISON:  None Available. FINDINGS: MENISCI Medial meniscus: There is a fairly extensive and complex tear involving the posterior midbody junction region. This is largely a radial tear posteriorly  with associated medial protrusion of meniscus measuring up to 3.75 mm. There is also free edge and inferior articular surface tearing. Lateral meniscus:  Intact LIGAMENTS Cruciates:  Intact Collaterals:  Intact CARTILAGE Patellofemoral:  Moderate degenerative chondrosis. Medial: Advanced degenerative chondrosis with areas of full or near full-thickness cartilage loss, joint space narrowing and osteophytic spurring. There is also moderate subchondral  edema involving the medial femoral condyle far medially. Lateral: Moderate degenerative chondrosis with early spurring changes. Joint:  Moderate to large joint effusion. Popliteal Fossa:  No popliteal mass or Baker's cyst. Extensor Mechanism: The patella retinacular structures are intact and the quadriceps and patellar tendons are intact. Bones:  No acute bony findings.  No osteochondral lesion. Other: Unremarkable knee musculature. IMPRESSION: 1. Complex medial meniscus tear as detailed above. 2. Intact ligamentous structures and no acute bony findings. 3. Tricompartmental degenerative changes most significant in the medial compartment. 4. Moderate to large joint effusion. Electronically Signed   By: Marijo Sanes M.D.   On: 11/12/2021 14:36   US Venous Img Lower Unilateral Left  Result Date: 11/05/2021 CLINICAL DATA:  Left posterior knee pain EXAM: LEFT LOWER EXTREMITY VENOUS DOPPLER ULTRASOUND TECHNIQUE: Gray-scale sonography with compression, as well as color and duplex ultrasound, were performed to evaluate the deep venous system(s) from the level of the common femoral vein through the popliteal and proximal calf veins. COMPARISON:  None Available. FINDINGS: VENOUS Normal compressibility of the common femoral, superficial femoral, and popliteal veins, as well as the visualized calf veins. Visualized portions of profunda femoral vein and great saphenous vein unremarkable. No filling defects to suggest DVT on grayscale or color Doppler imaging. Doppler waveforms show normal direction of venous flow, normal respiratory plasticity and response to augmentation. Limited views of the contralateral common femoral vein are unremarkable. OTHER None. Limitations: none IMPRESSION: Negative. Electronically Signed   By: Jacqulynn Cadet M.D.   On: 11/05/2021 15:04    LABORATORY DATA:  I have reviewed the data as listed Lab Results  Component Value Date   WBC 13.6 (H) 11/27/2021   HGB 13.8 11/27/2021   HCT 43.4  11/27/2021   MCV 84.3 11/27/2021   PLT 283 11/27/2021   Recent Labs    11/28/20 0955 11/27/21 1110  NA 137 138  K 3.8 3.8  CL 100 99  CO2 24 29  GLUCOSE 166* 159*  BUN 17 13  CREATININE 0.72 0.68  CALCIUM 8.7* 8.6*  GFRNONAA >60 >60  PROT 7.1 7.5  ALBUMIN 3.8 3.6  AST 20 20  ALT 17 19  ALKPHOS 87 91  BILITOT 0.4 0.4    Iron/TIBC/Ferritin/ %Sat    Component Value Date/Time   IRON 58 10/31/2012 1502   TIBC 358 10/31/2012 1502   FERRITIN 108 10/31/2012 1502   IRONPCTSAT 16 10/31/2012 1502        ASSESSMENT & PLAN:  1. Leukocytosis, unspecified type    Labs reviewed and discussed with patient.  Leukocytosis, mildly and is stable.  Predominantly neutrophilia and lymphocytosis.  Previous work-up was negative and patient has no constitutional symptoms.  I think the leukocytosis likely reactive to chronic inflammation.  She does have elevated CRP, ESR and uric acid. I encourage patient to further discuss her inflammation marker elevation with primary care provider  She does not need to follow-up with hematology at this point.  She may reestablish care in the future if clinically indicated.  Patient agrees with the plan.   All questions were answered. The patient knows to call the clinic  with any problems questions or concerns.   Earlie Server, MD, PhD Hematology Oncology  11/27/2021

## 2021-12-22 ENCOUNTER — Other Ambulatory Visit: Payer: Self-pay | Admitting: Orthopedic Surgery

## 2021-12-22 ENCOUNTER — Encounter: Payer: Self-pay | Admitting: Orthopedic Surgery

## 2021-12-22 DIAGNOSIS — Z01818 Encounter for other preprocedural examination: Secondary | ICD-10-CM

## 2021-12-22 NOTE — H&P (Signed)
[name] MRN:  [id number] DOB/SEX:  [birth year]/female  CHIEF COMPLAINT:  Painful left Knee  HISTORY: Patient is a 66 y.o. female presented with a history of pain in the left knee. Onset of symptoms was gradual starting several years ago with gradually worsening course since that time. Prior procedures on the knee include none. Patient has been treated conservatively with over-the-counter NSAIDs and activity modification. Patient currently rates pain in the knee at 10 out of 10 with activity. There is pain at night.  PAST MEDICAL HISTORY: Patient Active Problem List   Diagnosis Date Noted   PMB (postmenopausal bleeding) 11/01/2019   Mixed incontinence 11/01/2019   Anemia, unspecified 03/21/2017   Anxiety and depression 03/21/2017   Asthma without status asthmaticus 03/21/2017   Benign essential tremor 03/21/2017   Claustrophobia 03/21/2017   Fibromyalgia 03/21/2017   GERD (gastroesophageal reflux disease) 03/21/2017   Heart palpitations 03/21/2017   History of gastric ulcer 03/21/2017   History of motion sickness 03/21/2017   Hyperlipidemia 03/21/2017   Hypertension 03/21/2017   Hypothyroid 03/21/2017   Leukocytosis 03/21/2017   Osteoarthritis 03/21/2017   PONV (postoperative nausea and vomiting) 03/21/2017   Seasonal allergies 03/21/2017   Snores 03/21/2017   SOB (shortness of breath) 01/19/2017   Lumbosacral spondylosis without myelopathy 01/11/2017   Cervical radiculopathy at C8 10/04/2016   Migraine without aura and without status migrainosus, not intractable 10/04/2016   Acute nonintractable headache 07/20/2016   Sepsis ([place]) 07/12/2016   Chronic bilateral low back pain with bilateral sciatica 04/27/2016   Sacroiliitis ([place]) 04/27/2016   Radiculopathy of lumbar region 04/10/2016   History of fusion of cervical spine 02/23/2016   Radiculopathy of cervical region 02/23/2016   Bilateral thoracic back pain 05/07/2014   Knee joint replacement status 12/06/2013   Cervical  stenosis of spinal canal 11/21/2012   Neck pain 10/04/2012   Past Medical History:  Diagnosis Date   Anemia    Anxiety    Arthritis    Asthma    Benign essential tremor    Claustrophobia    Depression    Dysrhythmia    Fibromyalgia    Fusion of spine of cervical region    Gastric ulcer    GERD (gastroesophageal reflux disease)    Headache    Hemorrhoids    History of chicken pox    History of motion sickness    History of pleurisy    History of seasonal allergies    Hyperlipidemia    Hypertension    Hypothyroidism    Leukocytosis    PONV (postoperative nausea and vomiting)    Radiculopathy of cervical region    Past Surgical History:  Procedure Laterality Date   ALLOGRAFT APPLICATION     BREAST BIOPSY Bilateral 1970's   BREAST BIOPSY Left    stereotactic   CESAREAN SECTION     COLONOSCOPY WITH PROPOFOL N/A 04/14/2016   Procedure: COLONOSCOPY WITH PROPOFOL;  Surgeon: Manya Silvas, MD;  Location: [facility] ENDOSCOPY;  Service: Endoscopy;  Laterality: N/A;   COLONOSCOPY WITH PROPOFOL N/A 02/15/2020   Procedure: COLONOSCOPY WITH PROPOFOL;  Surgeon: Lesly Rubenstein, MD;  Location: [facility] ENDOSCOPY;  Service: Endoscopy;  Laterality: N/A;   ESOPHAGOGASTRODUODENOSCOPY (EGD) WITH PROPOFOL N/A 04/14/2016   Procedure: ESOPHAGOGASTRODUODENOSCOPY (EGD) WITH PROPOFOL;  Surgeon: Manya Silvas, MD;  Location: [facility] ENDOSCOPY;  Service: Endoscopy;  Laterality: N/A;   ESOPHAGOGASTRODUODENOSCOPY (EGD) WITH PROPOFOL N/A 02/15/2020   Procedure: ESOPHAGOGASTRODUODENOSCOPY (EGD) WITH PROPOFOL;  Surgeon: Lesly Rubenstein, MD;  Location: [facility] ENDOSCOPY;  Service: Endoscopy;  Laterality: N/A;   FLEXIBLE SIGMOIDOSCOPY N/A 11/03/2016   Procedure: FLEXIBLE SIGMOIDOSCOPY;  Surgeon: Manya Silvas, MD;  Location: [facility] ENDOSCOPY;  Service: Endoscopy;  Laterality: N/A;   FLEXIBLE SIGMOIDOSCOPY N/A 01/31/2017   Procedure: FLEXIBLE SIGMOIDOSCOPY;  Surgeon: Manya Silvas, MD;  Location: [facility]  ENDOSCOPY;  Service: Endoscopy;  Laterality: N/A;   gastric ulcer     JOINT REPLACEMENT     LEG TENDON SURGERY     NERVE SURGERY     on back   SHOULDER SURGERY     TONSILLECTOMY     TOTAL KNEE ARTHROPLASTY       MEDICATIONS:  (Not in a hospital admission)   ALLERGIES:   Allergies  Allergen Reactions   Cat Hair Extract Other (See Comments) and Itching    Congestion, watery eyes    Other Other (See Comments)   Oxycodone Other (See Comments)    Withdrawal symptoms   Zosyn [Piperacillin Sod-Tazobactam So] Cough    REVIEW OF SYSTEMS:  Pertinent items are noted in HPI.   FAMILY HISTORY:   Family History  Problem Relation Age of Onset   Breast cancer Mother 6   Hypertension Father    Cancer Sister        Hodkin's lymphoma    SOCIAL HISTORY:   Social History   Tobacco Use   Smoking status: Former    Packs/day: 2.00    Years: 35.00    Total pack years: 70.00    Types: Cigarettes    Quit date: 07/01/2000    Years since quitting: 21.4   Smokeless tobacco: Never  Substance Use Topics   Alcohol use: No     EXAMINATION:  Vital signs in last 24 hours: '@VSRANGES'$ @  General appearance: alert, cooperative, and no distress Neck: no JVD and supple, symmetrical, trachea midline Lungs: clear to auscultation bilaterally Heart: regular rate and rhythm, S1, S2 normal, no murmur, click, rub or gallop Abdomen: soft, non-tender; bowel sounds normal; no masses,  no organomegaly Extremities: extremities normal, atraumatic, no cyanosis or edema and Homans sign is negative, no sign of DVT Pulses: 2+ and symmetric Skin: Skin color, texture, turgor normal. No rashes or lesions Neurologic: Alert and oriented X 3, normal strength and tone. Normal symmetric reflexes. Normal coordination and gait  Musculoskeletal:  ROM 0-100, Ligaments intact,  Imaging Review Plain radiographs demonstrate severe degenerative joint disease of the left knee. The overall alignment is significant varus.  The bone quality appears to be good for age and reported activity level.  Assessment/Plan: Primary osteoarthritis, left knee   The patient history, physical examination and imaging studies are consistent with advanced degenerative joint disease of the left knee. The patient has failed conservative treatment.  The clearance notes were reviewed.  After discussion with the patient it was felt that Total Knee Replacement was indicated. The procedure,  risks, and benefits of total knee arthroplasty were presented and reviewed. The risks including but not limited to aseptic loosening, infection, blood clots, vascular injury, stiffness, patella tracking problems complications among others were discussed. The patient acknowledged the explanation, agreed to proceed with the plan.  Carlynn Spry 12/22/2021, 3:56 PM

## 2021-12-23 ENCOUNTER — Encounter
Admission: RE | Admit: 2021-12-23 | Discharge: 2021-12-23 | Disposition: A | Discharge status: Home / Self Care | Payer: Medicare Other | Source: Ambulatory Visit | Attending: Orthopedic Surgery | Admitting: Orthopedic Surgery

## 2021-12-23 DIAGNOSIS — Z01812 Encounter for preprocedural laboratory examination: Secondary | ICD-10-CM

## 2021-12-23 DIAGNOSIS — E119 Type 2 diabetes mellitus without complications: Secondary | ICD-10-CM | POA: Diagnosis not present

## 2021-12-23 DIAGNOSIS — Z01818 Encounter for other preprocedural examination: Secondary | ICD-10-CM | POA: Insufficient documentation

## 2021-12-23 DIAGNOSIS — R002 Palpitations: Secondary | ICD-10-CM | POA: Diagnosis not present

## 2021-12-23 HISTORY — DX: Dyspnea, unspecified: R06.00

## 2021-12-23 HISTORY — DX: Type 2 diabetes mellitus without complications: E11.9

## 2021-12-23 LAB — BASIC METABOLIC PANEL
Anion gap: 8 (ref 5–15)
BUN: 13 mg/dL (ref 8–23)
CO2: 24 mmol/L (ref 22–32)
Calcium: 8.4 mg/dL — ABNORMAL LOW (ref 8.9–10.3)
Chloride: 106 mmol/L (ref 98–111)
Creatinine, Ser: 0.79 mg/dL (ref 0.44–1.00)
GFR, Estimated: >60 mL/min (ref >60)
Glucose, Bld: 202 mg/dL — ABNORMAL HIGH (ref 70–99)
Potassium: 3.3 mmol/L — ABNORMAL LOW (ref 3.5–5.1)
Sodium: 138 mmol/L (ref 135–145)

## 2021-12-23 LAB — CBC
HCT: 41.2 % (ref 36.0–46.0)
Hemoglobin: 13.4 g/dL (ref 12.0–15.0)
MCH: 26.9 pg (ref 26.0–34.0)
MCHC: 32.5 g/dL (ref 30.0–36.0)
MCV: 82.6 fL (ref 80.0–100.0)
Platelets: 292 10*3/uL (ref 150–400)
RBC: 4.99 MIL/uL (ref 3.87–5.11)
RDW: 14.7 % (ref 11.5–15.5)
WBC: 13.5 10*3/uL — ABNORMAL HIGH (ref 4.0–10.5)
nRBC: 0 % (ref 0.0–0.2)

## 2021-12-23 LAB — SURGICAL PCR SCREEN
MRSA, PCR: NEGATIVE
Staphylococcus aureus: NEGATIVE

## 2021-12-23 LAB — TYPE AND SCREEN
ABO/RH(D): A POS
Antibody Screen: NEGATIVE

## 2021-12-23 NOTE — Patient Instructions (Addendum)
Your procedure is scheduled on: Monday January 04, 2022. Report to Day Surgery inside [facility] 2nd floor, stop by admissions desk before getting on elevator.  To find out your arrival time please call [phone] between 1PM - 3PM on Friday January 01, 2022.  Remember: Instructions that are not followed completely may result in serious medical risk,  up to and including death, or upon the discretion of your surgeon and anesthesiologist your  surgery may need to be rescheduled.     _X__ 1. Do not eat food or drink fluids after midnight the night before your procedure.                 No chewing gum or hard candies.   __X__2.  On the morning of surgery brush your teeth with toothpaste and water, you                may rinse your mouth with mouthwash if you wish.  Do not swallow any toothpaste or mouthwash.     _X__ 3.  No Alcohol for 24 hours before or after surgery.   _X__ 4.  Do Not Smoke or use e-cigarettes For 24 Hours Prior to Your Surgery.                 Do not use any chewable tobacco products for at least 6 hours prior to                 Surgery.  _X__  5.  Do not use any recreational drugs (marijuana, cocaine, heroin, ecstasy, MDMA or other)                For at least one week prior to your surgery.  Combination of these drugs with anesthesia                May have life threatening results.  ____  6.  Bring all medications with you on the day of surgery if instructed.   __X_ 7.  Notify your doctor if there is any change in your medical condition      (cold, fever, infections).     Do not wear jewelry, make-up, hairpins, clips or nail polish. Do not wear lotions, powders, or perfumes. You may wear deodorant. Do not shave 48 hours prior to surgery. Men may shave face and neck. Do not bring valuables to the hospital.    [facility] is not responsible for any belongings or valuables.  Contacts, dentures or bridgework may not be worn into  surgery. Leave your suitcase in the car. After surgery it may be brought to your room. For patients admitted to the hospital, discharge time is determined by your treatment team.   Patients discharged the day of surgery will not be allowed to drive home.   Make arrangements for someone to be with you for the first 24 hours of your Same Day Discharge.   __X__ Take these medicines the morning of surgery with A SIP OF WATER:    1. gabapentin (NEURONTIN) 300 MG  2. pantoprazole (PROTONIX) 40 MG  3.   4.   5.   6.  ____ Fleet Enema (as directed)   __X__ Use CHG Soap (or wipes) as directed  ____ Use Benzoyl Peroxide Gel as instructed  ____ Use inhalers on the day of surgery  ____ Stop metformin 2 days prior to surgery    __X__ Take 1/2 of usual insulin dose the night before surgery. No insulin the  morning          of surgery. insulin glargine (LANTUS) 100 UNIT/ML Solostar Pen  ____ Call your PCP, cardiologist, or Pulmonologist if taking Coumadin/Plavix/aspirin and ask when to stop before your surgery.   __X__ One Week prior to surgery- Stop Anti-inflammatories such as Ibuprofen, Aleve, Advil, Motrin, meloxicam (MOBIC), diclofenac, etodolac, ketorolac, Toradol, Daypro, piroxicam, Goody's or BC powders. OK TO USE TYLENOL IF NEEDED   __X__ Do not start any vitamins and or supplements until after surgery.    ____ Bring C-Pap to the hospital.    If you have any questions regarding your pre-procedure instructions,  Please call Pre-admit Testing at [phone]

## 2021-12-24 DIAGNOSIS — Z01818 Encounter for other preprocedural examination: Secondary | ICD-10-CM | POA: Diagnosis not present

## 2021-12-24 LAB — URINALYSIS, COMPLETE (UACMP) WITH MICROSCOPIC
Bacteria, UA: NONE SEEN
Bilirubin Urine: NEGATIVE
Glucose, UA: NEGATIVE mg/dL
Hgb urine dipstick: NEGATIVE
Ketones, ur: NEGATIVE mg/dL
Leukocytes,Ua: NEGATIVE
Nitrite: NEGATIVE
Protein, ur: 30 mg/dL — AB
Specific Gravity, Urine: 1.019 (ref 1.005–1.030)
pH: 5 (ref 5.0–8.0)

## 2021-12-24 LAB — HEMOGLOBIN A1C
Hgb A1c MFr Bld: 8 % — ABNORMAL HIGH (ref 4.8–5.6)
Mean Plasma Glucose: 183 mg/dL

## 2021-12-24 NOTE — Progress Notes (Signed)
  Perioperative Services: Pre-Admission/Anesthesia Testing  Abnormal Lab Notification    Date: 12/24/21  Name: [name] MRN:   [id number]  Re: Abnormal labs noted during PAT appointment   Provider(s) Notified: Kurtis Bushman, MD Notification mode: Routed and/or faxed via [facility]   ABNORMAL LAB VALUE(S): Lab Results  Component Value Date   GLUCOSE 202 (H) 12/23/2021   Notes: Patient with a T2DM diagnosis. She is currently on both oral (glimepiride) and parenteral (insulin glargine) therapies. With elevated glucose and history of diabetes, HgbA1c was added to preoperative labs. Result found to be elevated at 8.0%. In efforts to reduce his risk of developing SSI/PJI, or other potential perioperative complications, this communication is being sent in order to determine if patient is deemed to have adequate medical optimization, including preoperative glycemic control.   The odds ratio for SSI/PJI infection is between 2.8 and 3.4 for orthopedic surgery patients with pre-operative serum glucose levels of > 125 mg/dL or a post-operative levels of > 200 mg/dL (Davis, 2019).    Data suggests that a Hgb A1c threshold of 7.7% tends to be more indicative of infection than the commonly used 7% and should perhaps be the pre-operative patient optimization goal Carolin Guernsey et al., 2017).   With that being said, the benefit of improving glycemic control must be weighed against the overall risk associated with delaying a necessary elective orthopedic surgery for this patient.   Citations: Charlett Blake, A.F. Reducing the risk of infection after total joint arthroplasty: preoperative optimization. Arthroplasty 1, 4 (2019). [email or web address]  Lorrin Goodell MM, [place], Brigati D, [place] SM, [address] [place] JC, [place], [place], [place], Parvizi Lenna Sciara [place]. Determining the Threshold for HbA1c as a Predictor for Adverse Outcomes After Total  Joint Arthroplasty: A Multicenter, Retrospective Study. J Arthroplasty. 2017 Sep;32(9S):S263-S267.e1. [email or web address].   Honor Loh, MSN, APRN, FNP-C, CEN [facility]  Peri-operative Services Nurse Practitioner Phone: [phone] 12/24/21 8:59 AM

## 2021-12-28 ENCOUNTER — Encounter: Payer: Self-pay | Admitting: Orthopedic Surgery

## 2021-12-28 NOTE — Progress Notes (Signed)
Perioperative Services  Pre-Admission/Anesthesia Testing Clinical Review  Date: 12/30/21  Patient Demographics:  Name: [name] DOB:   Dec 25, 1955 MRN:   [id number]  Planned Surgical Procedure(s):    Case: [id number] Date/Time: 01/04/22 0715   Procedure: TOTAL KNEE ARTHROPLASTY (Left: Knee)   Anesthesia type: Spinal   Pre-op diagnosis: M17.12 Unilateral primary osteoarthritis, left knee   Location: [facility] OR ROOM 02 / [place] [facility]S FOR ANESTHESIA GROUP   Surgeons: Lovell Sheehan, MD   NOTE: Available PAT nursing documentation and vital signs have been reviewed. Clinical nursing staff has updated patient's PMH/PSHx, current medication list, and drug allergies/intolerances to ensure comprehensive history available to assist in medical decision making as it pertains to the aforementioned surgical procedure and anticipated anesthetic course. Extensive review of available clinical information performed. [place] PMH and PSHx updated with any diagnoses/procedures that  may have been inadvertently omitted during her intake with the pre-admission testing department's nursing staff.  Clinical Discussion:  [name] is a 66 y.o. female who is submitted for pre-surgical anesthesia review and clearance prior to her undergoing the above procedure. Patient is a Former Smoker (70 pack years; quit 06/2000). Pertinent PMH includes: CAD, diastolic dysfunction, HTN, HLD, T2DM, hypothyroidism, GERD (on daily PPI), gastric ulcer, asthma, dyspnea, anemia, fibromyalgia, cervical DDD (s/p ACDF C4-C7), essential tremor, anxiety, depression  Patient is followed by cardiology Corky Sox, MD). She was last seen in the cardiology clinic on 11/12/2021; notes reviewed.  At the time of her clinic visit, patient doing well overall from a cardiovascular perspective.  She denied any episodes of chest pain, significant shortness of breath, PND, orthopnea, palpitations, significant peripheral edema, vertiginous symptoms, or  presyncope/syncope.  Patient with fatigue and increased stress related to the care of her adult son and husband.  Patient with a past medical history significant for cardiovascular diagnoses.  Myocardial perfusion imaging study performed on 01/01/2019 revealed a mildly reduced left ventricular systolic function with an EF of 47%.  There were no regional wall motion abnormalities.  Stress images revealed a small reversible perfusion defect in the anterior region on stress images consistent with ischemia.  Further evaluation was recommended.  Coronary CTA with FFR analysis was performed on 01/18/2019 revealing normal left ventricular systolic function with an EF of 69%.  Calculated calcium score of 200.  There was multifocal nonobstructive coronary plaques noted; <25% proximal to mid LAD, <25% diagonal, <25% LCx, <25% ostial RCA, and <25% proximal to distal RCA.  FFR-CT analysis suggested no flow-limiting stenosis; LAD system: 0.87, LCx system 0.95, RCA system 0.91.  Most recent TTE was performed on 0 18 2020 revealing a mildly reduced left ventricular systolic function with mild LVH; LVEF 45%.  There was septal and apical hypokinesis.  Trivial to mild mitral and tricuspid valve regurgitation noted.  There was no evidence of a significant transvalvular gradient to suggest stenosis.  Blood pressure reasonably controlled at 130/82 on currently prescribed CCB and ARB therapies.  Patient is on as ezetimibe for her HLD diagnosis and further ASCVD prevention.  T2DM loosely controlled on currently prescribed regimen; last HgbA1c was 8.0% when checked on 10/15/2021.  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  Patient has several stairs in her home which she climbs several times a day with minimal shortness of breath.  With that being said, functional capacity, as defined by DASI, is documented as being >/= 4 METS.  No changes were made to her medication regimen.  Patient  to  follow-up with outpatient cardiology in 6 months or sooner if needed.  [name] is scheduled for an elective LEFT TOTAL KNEE ARTHROPLASTY on 01/04/2022 with Dr. Kurtis Bushman, MD. Given patient's past medical history significant for cardiovascular diagnoses, presurgical cardiac clearance was sought by the PAT team. Per cardiology, "this patient is optimized for surgery and may proceed with the planned procedural course with a LOW risk of significant perioperative cardiovascular complications". In review of her medication reconciliation, the patient is not noted to be taking any type of anticoagulation or antiplatelet therapies that would need to be held during her perioperative course.  Patient reports previous perioperative complications with anesthesia in the past. Patient has a PMH (+) for PONV. Symptoms and history of PONV will be discussed with patient by anesthesia team on the day of her procedure. Interventions will be ordered as deemed necessary based on patient's individual care needs as determined by anesthesiologist. Patient also has a history of (+) delayed emergence from general anesthesia. In review of the available records, it is noted that patient underwent a general anesthetic course here at [facility] in 01/2020 without documented complications.      12/23/2021    9:10 AM 11/27/2021   11:22 AM 11/05/2021    4:24 PM  Vitals with BMI  Height '5\' 4"'$     Weight 234 lbs 5 oz 232 lbs 13 oz   BMI 23.3    Systolic 007 [id number] 633  Diastolic 70 82 80  Pulse 75 69 70    Providers/Specialists:   NOTE: Primary physician provider listed below. Patient may have been seen by APP or partner within same practice.   PROVIDER ROLE / SPECIALTY LAST OV  Lovell Sheehan, MD Orthopedics (Surgeon) 12/22/2021  [place] Pink, MD Primary Care Provider 10/15/2021  Donnelly Angelica, MD Cardiology 11/12/2021  Earlie Server, MD Hematology 11/27/2021   Allergies:  Cat hair  extract, Oxycodone, and Zosyn [piperacillin sod-tazobactam so]  Current Home Medications:   No current facility-administered medications for this encounter.    acetaminophen (TYLENOL) 500 MG tablet   amLODipine (NORVASC) 10 MG tablet   citalopram (CELEXA) 10 MG tablet   ezetimibe (ZETIA) 10 MG tablet   gabapentin (NEURONTIN) 300 MG capsule   glimepiride (AMARYL) 1 MG tablet   insulin glargine (LANTUS) 100 UNIT/ML Solostar Pen   olmesartan (BENICAR) 40 MG tablet   pantoprazole (PROTONIX) 40 MG tablet   History:   Past Medical History:  Diagnosis Date   Anemia    Anxiety    Arthritis    Asthma    Benign essential tremor    CAD (coronary artery disease)    a.) MV 01/01/2019: EF 47%; reversible ant ischemia; b.) cCTA 01/18/2019: Ca score 200; <25& p-m LAD,diag, LCx, oRCA, p-dRCA; FFR CT analysis: LAD 0.87, LCx 0.95, RCA 0.91.   Claustrophobia    Complication of anesthesia    a.) delayed emergence; b.) PONV   Depression    Diastolic dysfunction 35/45/6256   a.) TTE 02/16/2017: EF >55%, triv MR/PR, mild TR, G1DD; b.) TTE 02/13/2019: EF 45%, mild LVH, septal and apical HK, triv MR, mild TR.   Dyspnea    Fibromyalgia    Fusion of spine of cervical region    Gastric ulcer    GERD (gastroesophageal reflux disease)    Hemorrhoids    Hepatic steatosis    History of chicken pox    History of motion sickness    History of  pleurisy    History of seasonal allergies    Hyperlipidemia    Hypertension    Hypothyroidism    Leukocytosis    Migraines    Palpitations    PONV (postoperative nausea and vomiting)    Radiculopathy of cervical region    Snores    a.) has had negative PSG testing   Type 2 diabetes mellitus treated with insulin [facility])    Past Surgical History:  Procedure Laterality Date   ACDF C4-C7 WITH ALLOGRAFT INSERTION N/A 11/29/2012   ACHILLES TENDON LENGTHENING Bilateral 2010   ARTHRODESIS ANTERIOR CERVICAL SPINE N/A 12/04/2012   BREAST BIOPSY Bilateral 1970's    BREAST BIOPSY Left    stereotactic   CARPAL TUNNEL RELEASE Bilateral    CESAREAN SECTION     COLONOSCOPY WITH PROPOFOL N/A 04/14/2016   Procedure: COLONOSCOPY WITH PROPOFOL;  Surgeon: Manya Silvas, MD;  Location: [place];  Service: Endoscopy;  Laterality: N/A;   COLONOSCOPY WITH PROPOFOL N/A 02/15/2020   Procedure: COLONOSCOPY WITH PROPOFOL;  Surgeon: Lesly Rubenstein, MD;  Location: [facility] ENDOSCOPY;  Service: Endoscopy;  Laterality: N/A;   ESOPHAGOGASTRODUODENOSCOPY (EGD) WITH PROPOFOL N/A 04/14/2016   Procedure: ESOPHAGOGASTRODUODENOSCOPY (EGD) WITH PROPOFOL;  Surgeon: Manya Silvas, MD;  Location: [facility] ENDOSCOPY;  Service: Endoscopy;  Laterality: N/A;   ESOPHAGOGASTRODUODENOSCOPY (EGD) WITH PROPOFOL N/A 02/15/2020   Procedure: ESOPHAGOGASTRODUODENOSCOPY (EGD) WITH PROPOFOL;  Surgeon: Lesly Rubenstein, MD;  Location: [facility] ENDOSCOPY;  Service: Endoscopy;  Laterality: N/A;   FLEXIBLE SIGMOIDOSCOPY N/A 11/03/2016   Procedure: FLEXIBLE SIGMOIDOSCOPY;  Surgeon: Manya Silvas, MD;  Location: [facility] ENDOSCOPY;  Service: Endoscopy;  Laterality: N/A;   FLEXIBLE SIGMOIDOSCOPY N/A 01/31/2017   Procedure: FLEXIBLE SIGMOIDOSCOPY;  Surgeon: Manya Silvas, MD;  Location: [facility] ENDOSCOPY;  Service: Endoscopy;  Laterality: N/A;   gastric ulcer     NERVE SURGERY     on back   plantar fasciitis Left    SHOULDER ARTHROSCOPY Right    TONSILLECTOMY     TOTAL KNEE ARTHROPLASTY Right 11/27/2013   Family History  Problem Relation Age of Onset   Breast cancer Mother 75   Hypertension Father    Cancer Sister        Hodkin's lymphoma   Social History   Tobacco Use   Smoking status: Former    Packs/day: 2.00    Years: 35.00    Total pack years: 70.00    Types: Cigarettes    Quit date: 07/01/2000    Years since quitting: 21.5   Smokeless tobacco: Never  Vaping Use   Vaping Use: Never used  Substance Use Topics   Alcohol use: No   Drug use: No    Pertinent Clinical Results:   LABS: Labs reviewed: Acceptable for surgery.  Component Date Value Ref Range Status   MRSA, PCR 12/23/2021 NEGATIVE  NEGATIVE Final   Staphylococcus aureus 12/23/2021 NEGATIVE  NEGATIVE Final   Comment: (NOTE) The Xpert SA Assay (FDA approved for NASAL specimens in patients 51 years of age and older), is one component of a comprehensive surveillance program. It is not intended to diagnose infection nor to guide or monitor treatment. Performed at [facility], [address] [place], [place] [address]    ABO/RH(D) 12/23/2021 A POS   Final   Antibody Screen 12/23/2021 NEG   Final   Sample Expiration 12/23/2021 01/06/2022,2359   Final   Extend sample reason 12/23/2021    Final  Value:NO TRANSFUSIONS OR PREGNANCY IN THE PAST 3 MONTHS Performed at [facility], [address] [place], [place] [address]    WBC 12/23/2021 13.5 (H)  4.0 - 10.5 K/uL Final   RBC 12/23/2021 4.99  3.87 - 5.11 MIL/uL Final   Hemoglobin 12/23/2021 13.4  12.0 - 15.0 g/dL Final   HCT 12/23/2021 41.2  36.0 - 46.0 % Final   MCV 12/23/2021 82.6  80.0 - 100.0 fL Final   MCH 12/23/2021 26.9  26.0 - 34.0 pg Final   MCHC 12/23/2021 32.5  30.0 - 36.0 g/dL Final   RDW 12/23/2021 14.7  11.5 - 15.5 % Final   Platelets 12/23/2021 292  150 - 400 K/uL Final   nRBC 12/23/2021 0.0  0.0 - 0.2 % Final   Performed at [facility], [place], [place] [address]   Sodium 12/23/2021 138  135 - 145 mmol/L Final   Potassium 12/23/2021 3.3 (L)  3.5 - 5.1 mmol/L Final   Chloride 12/23/2021 106  98 - 111 mmol/L Final   CO2 12/23/2021 24  22 - 32 mmol/L Final   Glucose, Bld 12/23/2021 202 (H)  70 - 99 mg/dL Final   Glucose reference range applies only to samples taken after fasting for at least 8 hours.   BUN 12/23/2021 13  8 - 23 mg/dL Final   Creatinine, Ser 12/23/2021 0.79  0.44 - 1.00 mg/dL Final   Calcium 12/23/2021 8.4 (L)  8.9 - 10.3 mg/dL Final   GFR, Estimated  12/23/2021 >60  >60 mL/min Final   Comment: (NOTE) Calculated using the CKD-EPI Creatinine Equation (2021)    Anion gap 12/23/2021 8  5 - 15 Final   Performed at [facility], [place], [place] [address]   Hgb A1c MFr Bld 12/23/2021 8.0 (H)  4.8 - 5.6 % Final   Comment: (NOTE)         Prediabetes: 5.7 - 6.4         Diabetes: >6.4         Glycemic control for adults with diabetes: <7.0    Mean Plasma Glucose 12/23/2021 183  mg/dL Final   Comment: (NOTE) Performed At: [facility] [place], [place] [id number] Rush Farmer MD DQ:[id number]     ECG: Date: 12/23/2021 Time ECG obtained: 1010 AM Rate: 65 bpm Rhythm: normal sinus Axis (leads I and aVF): Normal Intervals: PR 156 ms. QRS 82 ms. QTc 453 ms. ST segment and T wave changes: Nonspecific ST abnormality  Comparison: Previous tracing performed on 08/27/2020 revealed sinus tachycardia at a rate of 105 bpm.  There was evidence of an age undetermined anterior infarct present.  IMAGING / PROCEDURES: MRI KNEE LEFT WO CONTRAST performed on 11/11/2021 Complex medial meniscus tear as detailed above. Intact ligamentous structures and no acute bony findings. Tricompartmental degenerative changes most significant in the medial compartment. Moderate to large joint effusion.  MRI LUMBAR SPINE WO CONTRAST performed on 11/09/2021 Multilevel degenerative changes throughout the lumbar spine as above with mild spinal canal and subarticular zone narrowing at L3-L4 and L4-L5 without evidence of frank nerve root impingement, and moderate left neural foraminal stenosis at L5-S1 with possible irritation of the exiting L5 nerve root. This left foraminal stenosis at L5-S1 is progressed since 2011, while the other findings are not significantly changed. No other significant spinal canal or neural foraminal stenosis, and no other evidence of nerve root impingement.  TRANSTHORACIC ECHOCARDIOGRAM performed  on 02/13/2019 Mildly reduced  left ventricular systolic function with an EF of 45% Mild LVH Septal and apical hypokinesis noted Trivial MR Mild TR No AR or PR Normal gradients; no valvular stenosis No pericardial effusion  CT HEART ANGIOGRAM INCLUDING 3D IMAGING; CT FRACTIONAL FLOW RESERVE performed on 01/18/2019 Coronary artery calcium score: 200  Normal LV systolic function without regional wall motion abnormalities. Calculated  LVEF of 69%.  Non-obstructive coronary artery disease.  CAD-RADS* 1.  Multifocal calcified atherosclerotic plaques predominantly in the LAD and RCA, all with less than 25% stenosis.  No flow-limiting stenosis detected with CT-FFR.  Left Anterior Descending System: 0.87  Left Circumflex System: 0.95  Right Coronary System: 0.91    MYOCARDIAL PERFUSION IMAGING STUDY (LEXISCAN) performed on 01/01/2019 Mildly reduced left ventricular systolic function with an EF of 47% Normal myocardial thickening and wall motion Artifact was noted Left ventricular cavity size normal SPECT images demonstrate a small reversible perfusion abnormality of mild intensity present in the anterior region on stress images consistent with borderline anterior ischemia  Impression and Plan:  [name] has been referred for pre-anesthesia review and clearance prior to her undergoing the planned anesthetic and procedural courses. Available labs, pertinent testing, and imaging results were personally reviewed by me. This patient has been appropriately cleared by cardiology with an overall LOW risk of significant perioperative cardiovascular complications.  Based on clinical review performed today (12/30/21), barring any significant acute changes in the patient's overall condition, it is anticipated that she will be able to proceed with the planned surgical intervention. Any acute changes in clinical condition may necessitate her procedure being postponed and/or cancelled. Patient will meet  with anesthesia team (MD and/or CRNA) on the day of her procedure for preoperative evaluation/assessment. Questions regarding anesthetic course will be fielded at that time.   Pre-surgical instructions were reviewed with the patient during her PAT appointment and questions were fielded by PAT clinical staff. Patient was advised that if any questions or concerns arise prior to her procedure then she should return a call to PAT and/or her surgeon's office to discuss.  Honor Loh, MSN, APRN, FNP-C, CEN [facility]  Peri-operative Services Nurse Practitioner Phone: [phone] Fax: [phone] 12/30/21 9:10 AM  NOTE: This note has been prepared using Dragon dictation software. Despite my best ability to proofread, there is always the potential that unintentional transcriptional errors may still occur from this process.

## 2022-01-03 MED ORDER — ORAL CARE MOUTH RINSE: 15.0000 mL | Freq: Once | OROMUCOSAL | Status: AC

## 2022-01-03 MED ORDER — SODIUM CHLORIDE 0.9 % IV SOLN: INTRAVENOUS | Status: DC

## 2022-01-03 MED ORDER — POVIDONE-IODINE 10 % EX SWAB
2.0000 | Freq: Once | CUTANEOUS | Status: AC
  Administered 2022-01-04: 2 via TOPICAL

## 2022-01-03 MED ORDER — CEFAZOLIN SODIUM-DEXTROSE 2-4 GM/100ML-% IV SOLN
2.0000 g | INTRAVENOUS | Status: AC
  Administered 2022-01-04: 2 g via INTRAVENOUS

## 2022-01-03 MED ORDER — TRANEXAMIC ACID-NACL 1000-0.7 MG/100ML-% IV SOLN
1000.0000 mg | INTRAVENOUS | Status: AC
  Administered 2022-01-04: 1000 mg via INTRAVENOUS

## 2022-01-03 MED ORDER — CHLORHEXIDINE GLUCONATE 0.12 % MT SOLN: 15.0000 mL | Freq: Once | OROMUCOSAL | Status: AC

## 2022-01-04 ENCOUNTER — Inpatient Hospital Stay
Admission: RE | Admit: 2022-01-04 | Discharge: 2022-01-07 | DRG: 470 | Disposition: A | Discharge status: Home / Self Care | Payer: Medicare Other | Attending: Orthopedic Surgery | Admitting: Orthopedic Surgery

## 2022-01-04 ENCOUNTER — Other Ambulatory Visit: Payer: Self-pay

## 2022-01-04 ENCOUNTER — Ambulatory Visit: Payer: Medicare Other | Admitting: Urgent Care

## 2022-01-04 ENCOUNTER — Encounter
Admission: RE | Disposition: A | Discharge status: Home / Self Care | Payer: Self-pay | Source: Home / Self Care | Attending: Orthopedic Surgery

## 2022-01-04 ENCOUNTER — Ambulatory Visit: Payer: Medicare Other

## 2022-01-04 ENCOUNTER — Observation Stay: Payer: Medicare Other

## 2022-01-04 ENCOUNTER — Encounter: Payer: Self-pay | Admitting: Orthopedic Surgery

## 2022-01-04 DIAGNOSIS — Z8249 Family history of ischemic heart disease and other diseases of the circulatory system: Secondary | ICD-10-CM

## 2022-01-04 DIAGNOSIS — Z807 Family history of other malignant neoplasms of lymphoid, hematopoietic and related tissues: Secondary | ICD-10-CM

## 2022-01-04 DIAGNOSIS — F40240 Claustrophobia: Secondary | ICD-10-CM | POA: Diagnosis present

## 2022-01-04 DIAGNOSIS — E119 Type 2 diabetes mellitus without complications: Secondary | ICD-10-CM | POA: Diagnosis present

## 2022-01-04 DIAGNOSIS — N3946 Mixed incontinence: Secondary | ICD-10-CM

## 2022-01-04 DIAGNOSIS — Z79899 Other long term (current) drug therapy: Secondary | ICD-10-CM

## 2022-01-04 DIAGNOSIS — I10 Essential (primary) hypertension: Secondary | ICD-10-CM | POA: Diagnosis present

## 2022-01-04 DIAGNOSIS — Z794 Long term (current) use of insulin: Secondary | ICD-10-CM

## 2022-01-04 DIAGNOSIS — Z01812 Encounter for preprocedural laboratory examination: Secondary | ICD-10-CM

## 2022-01-04 DIAGNOSIS — M797 Fibromyalgia: Secondary | ICD-10-CM | POA: Diagnosis present

## 2022-01-04 DIAGNOSIS — M1712 Unilateral primary osteoarthritis, left knee: Principal | ICD-10-CM | POA: Diagnosis present

## 2022-01-04 DIAGNOSIS — I2510 Atherosclerotic heart disease of native coronary artery without angina pectoris: Secondary | ICD-10-CM | POA: Diagnosis present

## 2022-01-04 DIAGNOSIS — Z888 Allergy status to other drugs, medicaments and biological substances status: Secondary | ICD-10-CM

## 2022-01-04 DIAGNOSIS — F32A Depression, unspecified: Secondary | ICD-10-CM | POA: Diagnosis present

## 2022-01-04 DIAGNOSIS — K219 Gastro-esophageal reflux disease without esophagitis: Secondary | ICD-10-CM | POA: Diagnosis present

## 2022-01-04 DIAGNOSIS — Z87891 Personal history of nicotine dependence: Secondary | ICD-10-CM

## 2022-01-04 DIAGNOSIS — E785 Hyperlipidemia, unspecified: Secondary | ICD-10-CM | POA: Diagnosis present

## 2022-01-04 DIAGNOSIS — Z96652 Presence of left artificial knee joint: Secondary | ICD-10-CM

## 2022-01-04 DIAGNOSIS — Z981 Arthrodesis status: Secondary | ICD-10-CM

## 2022-01-04 DIAGNOSIS — F419 Anxiety disorder, unspecified: Secondary | ICD-10-CM | POA: Diagnosis present

## 2022-01-04 HISTORY — DX: Type 2 diabetes mellitus without complications: E11.9

## 2022-01-04 HISTORY — DX: Migraine, unspecified, not intractable, without status migrainosus: G43.909

## 2022-01-04 HISTORY — DX: Long term (current) use of insulin: Z79.4

## 2022-01-04 HISTORY — PX: TOTAL KNEE ARTHROPLASTY: SHX125

## 2022-01-04 HISTORY — DX: Other complications of anesthesia, initial encounter: T88.59XA

## 2022-01-04 HISTORY — DX: Fatty (change of) liver, not elsewhere classified: K76.0

## 2022-01-04 HISTORY — DX: Palpitations: R00.2

## 2022-01-04 HISTORY — DX: Snoring: R06.83

## 2022-01-04 HISTORY — DX: Atherosclerotic heart disease of native coronary artery without angina pectoris: I25.10

## 2022-01-04 LAB — GLUCOSE, CAPILLARY
Glucose-Capillary: 153 mg/dL — ABNORMAL HIGH (ref 70–99)
Glucose-Capillary: 134 mg/dL — ABNORMAL HIGH (ref 70–99)
Glucose-Capillary: 227 mg/dL — ABNORMAL HIGH (ref 70–99)

## 2022-01-04 LAB — ABO/RH: ABO/RH(D): A POS

## 2022-01-04 SURGERY — ARTHROPLASTY, KNEE, TOTAL
Anesthesia: Regional | Site: Knee | Laterality: Left

## 2022-01-04 MED ORDER — LACTATED RINGERS IV SOLN: INTRAVENOUS | Status: DC

## 2022-01-04 MED ORDER — CEFAZOLIN SODIUM-DEXTROSE 2-4 GM/100ML-% IV SOLN
2.0000 g | Freq: Four times a day (QID) | INTRAVENOUS | Status: AC
  Administered 2022-01-04 (×2): 2 g via INTRAVENOUS
  Filled 2022-01-04 (×2): qty 100

## 2022-01-04 MED ORDER — PROPOFOL 10 MG/ML IV BOLUS
INTRAVENOUS | Status: DC | PRN
  Administered 2022-01-04: 30 mg via INTRAVENOUS
  Administered 2022-01-04: 100 ug/kg/min via INTRAVENOUS

## 2022-01-04 MED ORDER — CEFAZOLIN SODIUM-DEXTROSE 2-4 GM/100ML-% IV SOLN
INTRAVENOUS | Status: AC
  Filled 2022-01-04: qty 100

## 2022-01-04 MED ORDER — LIDOCAINE HCL (CARDIAC) PF 100 MG/5ML IV SOSY
PREFILLED_SYRINGE | INTRAVENOUS | Status: DC | PRN
  Administered 2022-01-04: 40 mg via INTRAVENOUS

## 2022-01-04 MED ORDER — PHENOL 1.4 % MT LIQD: 1.0000 | OROMUCOSAL | Status: DC | PRN

## 2022-01-04 MED ORDER — PANTOPRAZOLE SODIUM 40 MG PO TBEC
40.0000 mg | DELAYED_RELEASE_TABLET | Freq: Every day | ORAL | Status: DC
  Administered 2022-01-05 – 2022-01-07 (×3): 40 mg via ORAL
  Filled 2022-01-04 (×4): qty 1

## 2022-01-04 MED ORDER — IRBESARTAN 150 MG PO TABS
300.0000 mg | ORAL_TABLET | Freq: Every day | ORAL | Status: DC
  Administered 2022-01-05 – 2022-01-07 (×3): 300 mg via ORAL
  Filled 2022-01-04 (×3): qty 2

## 2022-01-04 MED ORDER — BUPIVACAINE-EPINEPHRINE (PF) 0.5% -1:200000 IJ SOLN
INTRAMUSCULAR | Status: AC
Start: 2022-01-04 — End: ?
  Filled 2022-01-04: qty 30

## 2022-01-04 MED ORDER — PROPOFOL 1000 MG/100ML IV EMUL
INTRAVENOUS | Status: AC
  Filled 2022-01-04: qty 100

## 2022-01-04 MED ORDER — BUPIVACAINE HCL (PF) 0.5 % IJ SOLN
INTRAMUSCULAR | Status: DC | PRN
  Administered 2022-01-04: 2.6 mL

## 2022-01-04 MED ORDER — ONDANSETRON HCL 4 MG/2ML IJ SOLN
INTRAMUSCULAR | Status: AC
  Filled 2022-01-04: qty 2

## 2022-01-04 MED ORDER — SODIUM CHLORIDE 0.9 % IR SOLN
Status: DC | PRN
  Administered 2022-01-04: 3000 mL

## 2022-01-04 MED ORDER — SURGIPHOR WOUND IRRIGATION SYSTEM - OPTIME
TOPICAL | Status: DC | PRN
  Administered 2022-01-04: 450 mL via TOPICAL

## 2022-01-04 MED ORDER — EPHEDRINE SULFATE (PRESSORS) 50 MG/ML IJ SOLN
INTRAMUSCULAR | Status: DC | PRN
  Administered 2022-01-04 (×2): 5 mg via INTRAVENOUS

## 2022-01-04 MED ORDER — SODIUM CHLORIDE FLUSH 0.9 % IV SOLN
INTRAVENOUS | Status: AC
  Filled 2022-01-04: qty 40

## 2022-01-04 MED ORDER — ONDANSETRON HCL 4 MG/2ML IJ SOLN: 4.0000 mg | Freq: Once | INTRAMUSCULAR | Status: DC | PRN

## 2022-01-04 MED ORDER — EZETIMIBE 10 MG PO TABS
10.0000 mg | ORAL_TABLET | Freq: Every day | ORAL | Status: DC
Start: 2022-01-04 — End: 2022-01-07
  Administered 2022-01-04 – 2022-01-07 (×4): 10 mg via ORAL
  Filled 2022-01-04 (×4): qty 1

## 2022-01-04 MED ORDER — METOCLOPRAMIDE HCL 5 MG PO TABS: 5.0000 mg | ORAL_TABLET | Freq: Three times a day (TID) | ORAL | Status: DC | PRN

## 2022-01-04 MED ORDER — DIPHENHYDRAMINE HCL 12.5 MG/5ML PO ELIX: 12.5000 mg | ORAL_SOLUTION | ORAL | Status: DC | PRN

## 2022-01-04 MED ORDER — AMLODIPINE BESYLATE 10 MG PO TABS
10.0000 mg | ORAL_TABLET | Freq: Every day | ORAL | Status: DC
  Administered 2022-01-04 – 2022-01-06 (×3): 10 mg via ORAL
  Filled 2022-01-04 (×3): qty 1

## 2022-01-04 MED ORDER — MIDAZOLAM HCL 2 MG/2ML IJ SOLN
1.0000 mg | INTRAMUSCULAR | Status: AC | PRN
  Administered 2022-01-04: 1 mg via INTRAVENOUS

## 2022-01-04 MED ORDER — CITALOPRAM HYDROBROMIDE 10 MG PO TABS
10.0000 mg | ORAL_TABLET | Freq: Every day | ORAL | Status: DC
  Administered 2022-01-04 – 2022-01-07 (×3): 10 mg via ORAL
  Filled 2022-01-04 (×4): qty 1

## 2022-01-04 MED ORDER — MIDAZOLAM HCL 2 MG/2ML IJ SOLN
INTRAMUSCULAR | Status: AC
  Filled 2022-01-04: qty 2

## 2022-01-04 MED ORDER — TRANEXAMIC ACID-NACL 1000-0.7 MG/100ML-% IV SOLN
INTRAVENOUS | Status: AC
  Filled 2022-01-04: qty 100

## 2022-01-04 MED ORDER — SODIUM CHLORIDE (PF) 0.9 % IJ SOLN
INTRAMUSCULAR | Status: DC | PRN
  Administered 2022-01-04: 90 mL

## 2022-01-04 MED ORDER — BUPIVACAINE HCL (PF) 0.5 % IJ SOLN
INTRAMUSCULAR | Status: AC
  Filled 2022-01-04: qty 20

## 2022-01-04 MED ORDER — MORPHINE SULFATE (PF) 2 MG/ML IV SOLN
0.5000 mg | INTRAVENOUS | Status: DC | PRN
  Administered 2022-01-04 – 2022-01-06 (×7): 1 mg via INTRAVENOUS
  Filled 2022-01-04 (×7): qty 1

## 2022-01-04 MED ORDER — ONDANSETRON HCL 4 MG/2ML IJ SOLN
INTRAMUSCULAR | Status: DC | PRN
  Administered 2022-01-04: 4 mg via INTRAVENOUS

## 2022-01-04 MED ORDER — MIDAZOLAM HCL 2 MG/2ML IJ SOLN
INTRAMUSCULAR | Status: AC
  Administered 2022-01-04: 1 mg via INTRAVENOUS
  Filled 2022-01-04: qty 2

## 2022-01-04 MED ORDER — MENTHOL 3 MG MT LOZG: 1.0000 | LOZENGE | OROMUCOSAL | Status: DC | PRN

## 2022-01-04 MED ORDER — GABAPENTIN 300 MG PO CAPS: 300.0000 mg | ORAL_CAPSULE | Freq: Two times a day (BID) | ORAL | Status: DC | PRN

## 2022-01-04 MED ORDER — INSULIN GLARGINE-YFGN 100 UNIT/ML ~~LOC~~ SOLN
10.0000 [IU] | Freq: Every day | SUBCUTANEOUS | Status: DC
  Administered 2022-01-04 – 2022-01-07 (×4): 10 [IU] via SUBCUTANEOUS
  Filled 2022-01-04 (×4): qty 0.1

## 2022-01-04 MED ORDER — ACETAMINOPHEN 10 MG/ML IV SOLN: 1000.0000 mg | Freq: Once | INTRAVENOUS | Status: DC | PRN

## 2022-01-04 MED ORDER — BUPIVACAINE HCL (PF) 0.5 % IJ SOLN
INTRAMUSCULAR | Status: AC
  Filled 2022-01-04: qty 10

## 2022-01-04 MED ORDER — DOCUSATE SODIUM 100 MG PO CAPS
100.0000 mg | ORAL_CAPSULE | Freq: Two times a day (BID) | ORAL | Status: DC
  Administered 2022-01-05 – 2022-01-07 (×4): 100 mg via ORAL
  Filled 2022-01-04 (×5): qty 1

## 2022-01-04 MED ORDER — ACETAMINOPHEN 325 MG PO TABS: 325.0000 mg | ORAL_TABLET | Freq: Four times a day (QID) | ORAL | Status: DC | PRN

## 2022-01-04 MED ORDER — GLIMEPIRIDE 1 MG PO TABS
1.0000 mg | ORAL_TABLET | Freq: Every day | ORAL | Status: DC
  Administered 2022-01-05 – 2022-01-07 (×3): 1 mg via ORAL
  Filled 2022-01-04 (×3): qty 1

## 2022-01-04 MED ORDER — FENTANYL CITRATE PF 50 MCG/ML IJ SOSY: 50.0000 ug | PREFILLED_SYRINGE | Freq: Once | INTRAMUSCULAR | Status: AC

## 2022-01-04 MED ORDER — PHENYLEPHRINE HCL (PRESSORS) 10 MG/ML IV SOLN
INTRAVENOUS | Status: DC | PRN
  Administered 2022-01-04 (×6): 80 ug via INTRAVENOUS

## 2022-01-04 MED ORDER — LIDOCAINE HCL (PF) 2 % IJ SOLN
INTRAMUSCULAR | Status: AC
  Filled 2022-01-04: qty 5

## 2022-01-04 MED ORDER — 0.9 % SODIUM CHLORIDE (POUR BTL) OPTIME
TOPICAL | Status: DC | PRN
  Administered 2022-01-04: 500 mL

## 2022-01-04 MED ORDER — CHLORHEXIDINE GLUCONATE 0.12 % MT SOLN
OROMUCOSAL | Status: AC
  Administered 2022-01-04: 15 mL via OROMUCOSAL
  Filled 2022-01-04: qty 15

## 2022-01-04 MED ORDER — ASPIRIN 81 MG PO CHEW
81.0000 mg | CHEWABLE_TABLET | Freq: Two times a day (BID) | ORAL | Status: DC
  Administered 2022-01-04 – 2022-01-07 (×6): 81 mg via ORAL
  Filled 2022-01-04 (×6): qty 1

## 2022-01-04 MED ORDER — HYDROCODONE-ACETAMINOPHEN 7.5-325 MG PO TABS
1.0000 | ORAL_TABLET | ORAL | Status: DC | PRN
  Administered 2022-01-04 – 2022-01-05 (×6): 2 via ORAL
  Administered 2022-01-05: 1 via ORAL
  Administered 2022-01-05 – 2022-01-07 (×6): 2 via ORAL
  Filled 2022-01-04 (×8): qty 2
  Filled 2022-01-04: qty 1
  Filled 2022-01-04 (×5): qty 2

## 2022-01-04 MED ORDER — LACTATED RINGERS IV SOLN
INTRAVENOUS | Status: DC
Start: 2022-01-04 — End: 2022-01-07

## 2022-01-04 MED ORDER — KETOROLAC TROMETHAMINE 15 MG/ML IJ SOLN
7.5000 mg | Freq: Four times a day (QID) | INTRAMUSCULAR | Status: AC
  Administered 2022-01-04 – 2022-01-05 (×4): 7.5 mg via INTRAVENOUS
  Filled 2022-01-04 (×4): qty 1

## 2022-01-04 MED ORDER — ALUM & MAG HYDROXIDE-SIMETH 200-200-20 MG/5ML PO SUSP: 30.0000 mL | ORAL | Status: DC | PRN

## 2022-01-04 MED ORDER — FENTANYL CITRATE PF 50 MCG/ML IJ SOSY
PREFILLED_SYRINGE | INTRAMUSCULAR | Status: AC
  Administered 2022-01-04: 50 ug via INTRAVENOUS
  Filled 2022-01-04: qty 1

## 2022-01-04 MED ORDER — EPHEDRINE 5 MG/ML INJ
INTRAVENOUS | Status: AC
  Filled 2022-01-04: qty 5

## 2022-01-04 MED ORDER — PHENYLEPHRINE 80 MCG/ML (10ML) SYRINGE FOR IV PUSH (FOR BLOOD PRESSURE SUPPORT)
PREFILLED_SYRINGE | INTRAVENOUS | Status: AC
  Filled 2022-01-04: qty 10

## 2022-01-04 MED ORDER — HYDROCODONE-ACETAMINOPHEN 5-325 MG PO TABS: 1.0000 | ORAL_TABLET | ORAL | Status: DC | PRN

## 2022-01-04 MED ORDER — BISACODYL 10 MG RE SUPP: 10.0000 mg | Freq: Every day | RECTAL | Status: DC | PRN

## 2022-01-04 MED ORDER — ONDANSETRON HCL 4 MG/2ML IJ SOLN: 4.0000 mg | Freq: Four times a day (QID) | INTRAMUSCULAR | Status: DC | PRN

## 2022-01-04 MED ORDER — BUPIVACAINE HCL (PF) 0.5 % IJ SOLN
INTRAMUSCULAR | Status: DC | PRN
  Administered 2022-01-04: 20 mL

## 2022-01-04 MED ORDER — FENTANYL CITRATE (PF) 100 MCG/2ML IJ SOLN: 25.0000 ug | INTRAMUSCULAR | Status: DC | PRN

## 2022-01-04 MED ORDER — METOCLOPRAMIDE HCL 5 MG/ML IJ SOLN: 5.0000 mg | Freq: Three times a day (TID) | INTRAMUSCULAR | Status: DC | PRN

## 2022-01-04 MED ORDER — ONDANSETRON HCL 4 MG PO TABS: 4.0000 mg | ORAL_TABLET | Freq: Four times a day (QID) | ORAL | Status: DC | PRN

## 2022-01-04 MED ORDER — STERILE WATER FOR IRRIGATION IR SOLN
Status: DC | PRN
  Administered 2022-01-04: 1000 mL

## 2022-01-04 MED ORDER — BUPIVACAINE LIPOSOME 1.3 % IJ SUSP
INTRAMUSCULAR | Status: AC
Start: 2022-01-04 — End: ?
  Filled 2022-01-04: qty 20

## 2022-01-04 SURGICAL SUPPLY — 60 items
BASEPLATE TIBIAL LT SZ3 (Knees) ×1 IMPLANT
BLADE SAGITTAL AGGR TOOTH XLG (BLADE) ×2 IMPLANT
BLADE SAW SAG 25X90X1.19 (BLADE) ×2 IMPLANT
BOWL CEMENT MIX W/ADAPTER (MISCELLANEOUS) ×2 IMPLANT
BRUSH SCRUB EZ  4% CHG (MISCELLANEOUS) ×1
BRUSH SCRUB EZ 4% CHG (MISCELLANEOUS) ×1 IMPLANT
CEMENT BONE 40GM (Cement) ×4 IMPLANT
CHLORAPREP W/TINT 26 (MISCELLANEOUS) ×4 IMPLANT
COMP FEMORAL SZ 4 LEFT NARROW (Orthopedic Implant) ×2 IMPLANT
COMP PATELLA FENESIS 32 OVAL (Stem) ×2 IMPLANT
COMPONENT FEMRL SZ4 LT NARROW (Orthopedic Implant) IMPLANT
COMPONENT PTLLA GENS 32 OVAL (Stem) IMPLANT
COOLER POLAR GLACIER W/PUMP (MISCELLANEOUS) ×2 IMPLANT
CUFF TOURN SGL QUICK 34 (TOURNIQUET CUFF)
CUFF TRNQT CYL 34X4.125X (TOURNIQUET CUFF) ×1 IMPLANT
DRAPE 3/4 80X56 (DRAPES) ×4 IMPLANT
DRAPE INCISE IOBAN 66X60 STRL (DRAPES) ×2 IMPLANT
ELECT REM PT RETURN 9FT ADLT (ELECTROSURGICAL) ×2
ELECTRODE REM PT RTRN 9FT ADLT (ELECTROSURGICAL) ×1 IMPLANT
GAUZE SPONGE 4X4 12PLY STRL (GAUZE/BANDAGES/DRESSINGS) ×2 IMPLANT
GAUZE XEROFORM 1X8 LF (GAUZE/BANDAGES/DRESSINGS) ×2 IMPLANT
GLOVE BIO SURGEON STRL SZ8 (GLOVE) ×2 IMPLANT
GLOVE BIOGEL PI IND STRL 8.5 (GLOVE) ×2 IMPLANT
GLOVE BIOGEL PI INDICATOR 8.5 (GLOVE) ×2
GLOVE SURG ORTHO 8.5 STRL (GLOVE) ×2 IMPLANT
GOWN STRL REUS W/ TWL LRG LVL3 (GOWN DISPOSABLE) ×1 IMPLANT
GOWN STRL REUS W/ TWL XL LVL3 (GOWN DISPOSABLE) ×1 IMPLANT
GOWN STRL REUS W/TWL LRG LVL3 (GOWN DISPOSABLE) ×1
GOWN STRL REUS W/TWL XL LVL3 (GOWN DISPOSABLE) ×1
HOOD PEEL AWAY FLYTE STAYCOOL (MISCELLANEOUS) ×5 IMPLANT
INSERT TIB XLPE SZ 3-4 11 (Insert) ×1 IMPLANT
IV NS IRRIG 3000ML ARTHROMATIC (IV SOLUTION) ×2 IMPLANT
KIT TURNOVER KIT A (KITS) ×2 IMPLANT
MANIFOLD NEPTUNE II (INSTRUMENTS) ×2 IMPLANT
MAT ABSORB  FLUID 56X50 GRAY (MISCELLANEOUS) ×1
MAT ABSORB FLUID 56X50 GRAY (MISCELLANEOUS) ×1 IMPLANT
NDL SAFETY ECLIPSE 18X1.5 (NEEDLE) ×1 IMPLANT
NDL SPNL 20GX3.5 QUINCKE YW (NEEDLE) ×1 IMPLANT
NEEDLE HYPO 18GX1.5 SHARP (NEEDLE) ×1
NEEDLE SPNL 20GX3.5 QUINCKE YW (NEEDLE) ×2 IMPLANT
NS IRRIG 1000ML POUR BTL (IV SOLUTION) ×2 IMPLANT
PACK TOTAL KNEE (MISCELLANEOUS) ×2 IMPLANT
PAD DE MAYO PRESSURE PROTECT (MISCELLANEOUS) ×2 IMPLANT
PAD WRAPON POLAR KNEE (MISCELLANEOUS) ×1 IMPLANT
PULSAVAC PLUS IRRIG FAN TIP (DISPOSABLE) ×2
SOLUTION IRRIG SURGIPHOR (IV SOLUTION) ×2 IMPLANT
SOLUTION PRONTOSAN WOUND 350ML (IRRIGATION / IRRIGATOR) ×1 IMPLANT
STAPLER SKIN PROX 35W (STAPLE) ×2 IMPLANT
SUCTION FRAZIER HANDLE 10FR (MISCELLANEOUS) ×1
SUCTION TUBE FRAZIER 10FR DISP (MISCELLANEOUS) ×1 IMPLANT
SUT DVC 2 QUILL PDO  T11 36X36 (SUTURE) ×1
SUT DVC 2 QUILL PDO T11 36X36 (SUTURE) ×1 IMPLANT
SUT VIC AB 2-0 CT1 18 (SUTURE) ×2 IMPLANT
SUT VIC AB 2-0 CT1 27 (SUTURE)
SUT VIC AB 2-0 CT1 TAPERPNT 27 (SUTURE) IMPLANT
SUT VIC AB PLUS 45CM 1-MO-4 (SUTURE) ×2 IMPLANT
SYR 30ML LL (SYRINGE) ×6 IMPLANT
TIP FAN IRRIG PULSAVAC PLUS (DISPOSABLE) ×1 IMPLANT
WATER STERILE IRR 500ML POUR (IV SOLUTION) ×2 IMPLANT
WRAPON POLAR PAD KNEE (MISCELLANEOUS) ×2

## 2022-01-04 NOTE — Anesthesia Preprocedure Evaluation (Addendum)
Anesthesia Evaluation  Patient identified by MRN, date of birth, ID band Patient awake    Reviewed: Allergy & Precautions, NPO status , Patient's Chart, lab work & pertinent test results  History of Anesthesia Complications (+) PONV, PROLONGED EMERGENCE and history of anesthetic complications  Airway Mallampati: IV   Neck ROM: Limited    Dental  (+) Missing   Pulmonary asthma , former smoker (quit 2002),    Pulmonary exam normal breath sounds clear to auscultation       Cardiovascular hypertension, + CAD and +CHF (diastolic)  Normal cardiovascular exam Rhythm:Regular Rate:Normal  ECG 12/23/21:  Normal sinus rhythm Nonspecific ST abnormality  Echo 1/93/79:  MILD LV SYSTOLIC DYSFUNCTION (See above)  WITH MILD LVH  NORMAL RIGHT VENTRICULAR SYSTOLIC FUNCTION  MILD VALVULAR REGURGITATION (See above)  NO VALVULAR STENOSIS  TRIVIAL MR  MILD TR  EF 45%  Closest EF: 45% (Estimated)  Contraction: REGIONALLY IMPAIRED  LVH: MILD LVH  Mitral: TRIVIAL MR  Tricuspid: MILD TR   Myocardial perfusion 01/01/19:  Negative Lexiscan stress. Mild reduced LV function. Borderline anterior ischemia suggest clinical correlation.   Neuro/Psych  Headaches, PSYCHIATRIC DISORDERS Anxiety Depression S/p ACDF; Essential tremor    GI/Hepatic PUD, GERD  ,  Endo/Other  diabetes, Type 2, Insulin DependentHypothyroidism Obesity   Renal/GU negative Renal ROS     Musculoskeletal  (+) Arthritis , Fibromyalgia -  Abdominal   Peds  Hematology  (+) Blood dyscrasia, anemia ,   Anesthesia Other Findings Reviewed and agree with Bayard Males pre-anesthesia clinical review note.  Cardiology note 11/12/21:  66 y.o. female with   1. SOB (shortness of breath)- stable shortness of breath. Echo showed EF 45% (but normal by CTA) and estimated right ventricular systolic pressure of 02.4 mmHg. Likely secondary to obesity. Has exertional dyspnea with  borderline anterior changes on functional study. Had a negative cardiac cath in 2013 and CTA coronary in 12/2018. We will continue to attempt to treat her blood pressure medically. R06.02 786.05   2. Hyperlipidemia, unspecified hyperlipidemia type-continue with low-fat diet. She has prior allergy to statins listed as muscle pains. E78.5 272.4 -lipid panel 11/13/2020-TC 269, LDL unable to calculate, HDL 40, TG 592. - She is willing to start zetia 10 mg daily   3. Essential hypertension-blood pressure is normal today. Continue with amlodipine 10 mg, olmesartan at 40 mg daily, and spironolactone 25 mg daily. follow-up with nephrology. Dash diet and weight loss is also recommended. I10 401.9 -creatinine 0.8 on 10/15/2021   4. Insulin-dependent type 2 diabetes A1c most recently is 8%. She is managed by her primary care doctor on insulin.  Return in about 6 months (around 05/15/2022).    Reproductive/Obstetrics                            Anesthesia Physical Anesthesia Plan  ASA: 3  Anesthesia Plan: General, Spinal and Regional   Post-op Pain Management: Regional block*   Induction: Intravenous  PONV Risk Score and Plan: 4 or greater and Propofol infusion, TIVA, Treatment may vary due to age or medical condition and Ondansetron  Airway Management Planned: Natural Airway and Nasal Cannula  Additional Equipment:   Intra-op Plan:   Post-operative Plan:   Informed Consent: I have reviewed the patients History and Physical, chart, labs and discussed the procedure including the risks, benefits and alternatives for the proposed anesthesia with the patient or authorized representative who has indicated his/her understanding and acceptance.  Plan Discussed with: CRNA  Anesthesia Plan Comments: (Plan for preoperative adductor saphenous nerve block, spinal, and GA with natural airway, LMA/GETA backup.  Patient consented for risks of anesthesia including but not  limited to:  - adverse reactions to medications - damage to eyes, teeth, lips or other oral mucosa - nerve damage due to positioning  - sore throat or hoarseness - headache, bleeding, infection, nerve damage 2/2 spinal - damage to heart, brain, nerves, lungs, other parts of body or loss of life  Informed patient about role of CRNA in peri- and intra-operative care.  Patient voiced understanding.)        Anesthesia Quick Evaluation

## 2022-01-04 NOTE — Anesthesia Procedure Notes (Signed)
Spinal  Patient location during procedure: OR Start time: 01/04/2022 7:47 AM End time: 01/04/2022 7:55 AM Reason for block: surgical anesthesia Staffing Performed: resident/CRNA  Resident/CRNA: Pidana, Munesh, CRNA Performed by: Pidana, Munesh, CRNA Authorized by: Mazzoni, Andrea, MD   Preanesthetic Checklist Completed: patient identified, IV checked, site marked, risks and benefits discussed, surgical consent, monitors and equipment checked, pre-op evaluation and timeout performed Spinal Block Patient position: sitting Prep: ChloraPrep Patient monitoring: heart rate, continuous pulse ox, blood pressure and cardiac monitor Approach: midline Location: L4-5 Injection technique: single-shot Needle Needle type: Introducer and Pencan  Needle gauge: 24 G Needle length: 9 cm Assessment Sensory level: T6 Events: CSF return Additional Notes Negative paresthesia. Negative blood return. Positive free-flowing CSF. Expiration date of kit checked and confirmed. Patient tolerated procedure well, without complications.       

## 2022-01-04 NOTE — Op Note (Signed)
DATE OF SURGERY:  01/04/2022 TIME: 9:42 AM  PATIENT NAME:  [name]   AGE: 66 y.o.    PRE-OPERATIVE DIAGNOSIS:  M17.12 Unilateral primary osteoarthritis, left knee  POST-OPERATIVE DIAGNOSIS:  Same  PROCEDURE:  Procedure(s): TOTAL KNEE ARTHROPLASTY, LEFT  SURGEON:  Lovell Sheehan, MD   ASSISTANT:  Carlynn Spry,  PA-C  OPERATIVE IMPLANTS: Tamala [employer]hew, Cruciate Retaining Oxinium Femoral component size 4 narrow, Fixed Bearing Tray size 3, Patella polyethylene 3-peg oval button size 32 mm, with a 11 mm DISH insert.   PREOPERATIVE INDICATIONS:  [name] is an 66 y.o. female who has a diagnosis of M17.12 Unilateral primary osteoarthritis, left knee and elected for a total knee arthroplasty after failing nonoperative treatment, including activity modification, pain medication, physical therapy and injections who has significant impairment of their activities of daily living.  Radiographs have demonstrated tricompartmental osteoarthritis joint space narrowing, osteophytes, subchondral sclerosis and cyst formation.  The risks, benefits, and alternatives were discussed at length including but not limited to the risks of infection, bleeding, nerve or blood vessel injury, knee stiffness, fracture, dislocation, loosening or failure of the hardware and the need for further surgery. Medical risks include but not limited to DVT and pulmonary embolism, myocardial infarction, stroke, pneumonia, respiratory failure and death. I discussed these risks with the patient in my office prior to the date of surgery. They understood these risks and were willing to proceed.  OPERATIVE FINDINGS AND UNIQUE ASPECTS OF THE CASE:  All three compartments with advanced and severe degenerative changes, large osteophytes and an abundance of synovial fluid. Significant deformity was also noted. A decision was made to proceed with total knee arthroplasty.   OPERATIVE DESCRIPTION:  The patient was brought to the  operative room and placed in a supine position after undergoing placement of a general anesthetic. IV antibiotics were given. Patient received tranexamic acid. The lower extremity was prepped and draped in the usual sterile fashion.  A time out was performed to verify the patient's name, date of birth, medical record number, correct site of surgery and correct procedure to be performed. The timeout was also used to confirm the patient received antibiotics and that appropriate instruments, implants and radiographs studies were available in the room.  The leg was elevated and exsanguinated with an Esmarch and the tourniquet was inflated to 250 mmHg.  A midline incision was made over the left knee.. A medial parapatellar arthrotomy was then made and the patella subluxed laterally and the knee was brought into 90 of flexion. Hoffa's fat pad along with the anterior cruciate ligament was resected and the medial joint line was exposed.  Attention was then turned to preparation of the patella. The thickness of the patella was measured with a caliper, the diameter measured with the patella templates.  The patella resection was then made with an oscillating saw using the patella cutting guide.  The 32 mm button fit appropriately.  3 peg holes for the patella component were then drilled.  The extramedullary tibial cutting guide was then placed using the anterior tibial crest and second ray of the foot as a reference.  The tibial cutting guide was adjusted to allow for appropriate posterior slope.  The tibial cutting block was pinned into position. The slotted stylus was used to measure the proximal tibial resection of 9 mm off the high lateral side. Care was taken during the tibial resection to protect the medial and collateral ligaments.  The resected tibial bone was removed.  The distal femur was resected using the intramedullary cutting guide.  Care was taken to protect the collateral ligaments during distal  femoral resection.  The distal femoral resection was performed with an oscillating saw. The femoral cutting guide was then removed. Extension gap was measured with a 11 mm spacer block and alignment and extension was confirmed using a long alignment rod. The femur was sized to be a 4 narrow. Rotation of the referencing guide was checked with the epicondylar axis and Whitesides line. Then the 4-in-1 cutting jig was then applied to the distal femur. A stylus was used to confirm that the anterior femur would not be notched.   Then the anterior, posterior and chamfer femoral cuts were then made with an oscillating saw.  The knee was distracted and all posterior osteophytes were removed.  The flexion gap was then measured with a flexion spacer block and long alignment rod and was found to be symmetric with the extension gap and perpendicular to mechanical axis of the tibia.  The proximal tibia plateau was then sized with trial trays. The best coverage was achieved with a size 3. This tibial tray was then pinned into position. The proximal tibia was then prepared with the keel punch.  After tibial preparation was completed, all trial components were inserted with polyethylene trials. The knee achieved full extension and flexed to 120 degrees. Ligament were stable to varus and valgus at full extension as well as 30, 60 and 90 degrees of flexion.   The trials were then placed. Knee was taken through a full range of motion and deemed to be stable with the trial components. All trial components were then removed.  The joint was copiously irrigated with pulse lavage.  The final total knee arthroplasty components were then cemented into place. The knee was held in extension while cement was allowed to cure.The knee was taken through a range of motion and the patella tracked well and the knee was again irrigated copiously.  The knee capsule was then injected with Exparel.  The medial arthrotomy was closed with #1 Vicryl  and #2 Quill. The subcutaneous tissue closed with  2-0 vicryl, and skin approximated with staples.  A dry sterile and compressive dressing was applied.  A Polar Care was applied to the operative knee.  The patient was awakened and brought to the PACU in stable and satisfactory condition.  All sharp, lap and instrument counts were correct at the conclusion the case. I spoke with the patient's family in the postop consultation room to let them know the case had been performed without complication and the patient was stable in recovery room.   Total tourniquet time was 49 minutes.

## 2022-01-04 NOTE — Anesthesia Postprocedure Evaluation (Signed)
Anesthesia Post Note  Patient: [name]  Procedure(s) Performed: TOTAL KNEE ARTHROPLASTY (Left: Knee)  Patient location during evaluation: PACU Anesthesia Type: Spinal Level of consciousness: awake and alert, oriented and patient cooperative Pain management: pain level controlled Vital Signs Assessment: post-procedure vital signs reviewed and stable Respiratory status: spontaneous breathing, nonlabored ventilation and respiratory function stable Cardiovascular status: blood pressure returned to baseline and stable Postop Assessment: adequate PO intake, no headache and spinal receding Anesthetic complications: no   No notable events documented.   Last Vitals:  Vitals:   01/04/22 1049 01/04/22 1051  BP:    Pulse:    Resp:    Temp:  36.7 C  SpO2: 93% 93%    Last Pain:  Vitals:   01/04/22 1220  TempSrc:   PainSc: 10-Worst pain ever                 Darrin Nipper

## 2022-01-04 NOTE — Plan of Care (Signed)

## 2022-01-04 NOTE — Progress Notes (Cosign Needed)
Patient is not able to walk the distance required to go the bathroom, or he/she is unable to safely negotiate stairs required to access the bathroom.  A 3in1 BSC will alleviate this problem  

## 2022-01-04 NOTE — Evaluation (Signed)
Physical Therapy Evaluation Patient Details Name: [name] MRN: [id number] DOB: 12/10/1955 Today's Date: 01/04/2022  History of Present Illness  Pt is s/p elective L TKA.  Clinical Impression  Pt admitted with above diagnosis. Pt received upright in bed agreeable to PT. Reports at baseline she is independent with mobility, ADL's/IADL's, assists with husband who is w/c bound and cares for son with special needs. Pt reports son has PCA for 5 days/week but nothing set up for weekends.  Limited family support otherwise besides niece who is available sparingly.   To date, pt performing bed mobility with supervision with increased time and HOB moderately elevated. With VC's on technique with hand placement pt able to stand with minguard to RW with ability to ambulate 4' at [facility] demonstrating safe turning with RW and overall good stability of L knee during stance phase. Does rely heavily on UE's for offloading. Pt returning to seated EoB. Education provided on HEP packet (reps/sets/frequency), LLE knee positioning for contracture prevention, and mitigating falls risk as pt has 7 dogs, and encouraged to find assist for Saturdays/Sundays for son's care while pt recovers. Based off of current functional mobility, pending progression of gait, pain tolerance, and understanding of HEP, anticipate pt will be safe to d/c home with [facility] PT services. Pt left EoB with all needs in reach and family present. Pt currently with functional limitations due to the deficits listed below (see PT Problem List). Pt will benefit from skilled PT to increase their independence and safety with mobility to allow discharge to the venue listed below.      Recommendations for follow up therapy are one component of a multi-disciplinary discharge planning process, led by the attending physician.  Recommendations may be updated based on patient status, additional functional criteria and insurance authorization.  Follow Up Recommendations Home  health PT      Assistance Recommended at Discharge Intermittent Supervision/Assistance  Patient can return home with the following  A little help with walking and/or transfers;A little help with bathing/dressing/bathroom;Assistance with cooking/housework;Assist for transportation;Help with stairs or ramp for entrance    Equipment Recommendations None recommended by PT  Recommendations for Other Services       Functional Status Assessment Patient has had a recent decline in their functional status and demonstrates the ability to make significant improvements in function in a reasonable and predictable amount of time.     Precautions / Restrictions Precautions Precautions: Fall;Knee Precaution Booklet Issued: Yes (comment) Restrictions Weight Bearing Restrictions: Yes LLE Weight Bearing: Weight bearing as tolerated      Mobility  Bed Mobility Overal bed mobility: Needs Assistance Bed Mobility: Supine to Sit     Supine to sit: HOB elevated, Supervision     General bed mobility comments: Required HoB extensively elevated. Patient Response: Cooperative  Transfers Overall transfer level: Needs assistance Equipment used: Rolling walker (2 wheels) Transfers: Sit to/from Stand Sit to Stand: Min guard           General transfer comment: Bouts of momentum and VC's for hand placement.    Ambulation/Gait Ambulation/Gait assistance: Min guard Gait Distance (Feet): 4 Feet Assistive device: Rolling walker (2 wheels) Gait Pattern/deviations: Step-to pattern, Decreased stance time - left, Decreased step length - right, Antalgic       General Gait Details: Overall antalgic gait with heavy UE use on RW needed.  Stairs            Wheelchair Mobility    Modified Rankin (Stroke Patients Only)  Balance Overall balance assessment: Needs assistance Sitting-balance support: Bilateral upper extremity supported, Feet supported Sitting balance-Leahy Scale: Good      Standing balance support: Bilateral upper extremity supported, During functional activity, Reliant on assistive device for balance Standing balance-Leahy Scale: Fair                               Pertinent Vitals/Pain Pain Assessment Pain Assessment: Faces Faces Pain Scale: Hurts little more Pain Location: L knee Pain Descriptors / Indicators: Discomfort, Grimacing Pain Intervention(s): Limited activity within patient's tolerance, Monitored during session, Repositioned, Ice applied, Premedicated before session    [place] expects to be discharged to:: Private residence Living Arrangements: Spouse/significant other;Children Available Help at Discharge: Family Type of Home: House Home Access: Stairs to enter;Ramped entrance   Entrance Stairs-Number of Steps: 6 STE on front porch, ramp at back entrance   Home Layout: One level Home Equipment: Conservation officer, nature (2 wheels);BSC/3in1;Shower seat      Prior Function Prior Level of Function : Independent/Modified Independent                     Hand Dominance        Extremity/Trunk Assessment   Upper Extremity Assessment Upper Extremity Assessment: Defer to OT evaluation    Lower Extremity Assessment Lower Extremity Assessment: Generalized weakness;LLE deficits/detail LLE Deficits / Details: L TKA    Cervical / Trunk Assessment Cervical / Trunk Assessment: Normal  Communication   Communication: No difficulties  Cognition Arousal/Alertness: Awake/alert Behavior During Therapy: WFL for tasks assessed/performed Overall Cognitive Status: Within Functional Limits for tasks assessed                                          General Comments      Exercises Total Joint Exercises Ankle Circles/Pumps: AROM, Strengthening, Both, 10 reps, Supine Hip ABduction/ADduction: AROM, Strengthening, Left, Supine, 10 reps Other Exercises Other Exercises: Role of PT in acute  setting, WB precautions, LLE positioning to prevent contracture. Education provided on mitigating falls risk as pt has 7 dogs at home. HEP provided.   Assessment/Plan    PT Assessment Patient needs continued PT services  PT Problem List Decreased strength;Decreased mobility;Decreased safety awareness;Decreased range of motion;Decreased activity tolerance;Decreased balance;Decreased knowledge of use of DME;Pain       PT Treatment Interventions DME instruction;Therapeutic exercise;Gait training;Balance training;Stair training;Neuromuscular re-education;Functional mobility training;Therapeutic activities;Patient/family education    PT Goals (Current goals can be found in the Care Plan section)  Acute Rehab PT Goals Patient Stated Goal: To go home PT Goal Formulation: With patient Time For Goal Achievement: 01/18/22 Potential to Achieve Goals: Good    Frequency BID     Co-evaluation               AM-PAC PT "6 Clicks" Mobility  Outcome Measure Help needed turning from your back to your side while in a flat bed without using bedrails?: A Little Help needed moving from lying on your back to sitting on the side of a flat bed without using bedrails?: A Little Help needed moving to and from a bed to a chair (including a wheelchair)?: A Little Help needed standing up from a chair using your arms (e.g., wheelchair or bedside chair)?: A Little Help needed to walk in hospital room?: A Lot Help needed climbing 3-5  steps with a railing? : A Lot 6 Click Score: 16    End of Session Equipment Utilized During Treatment: Gait belt Activity Tolerance: Patient tolerated treatment well Patient left: in bed;with call bell/phone within reach;with family/visitor present (seated) Nurse Communication: Mobility status PT Visit Diagnosis: Other abnormalities of gait and mobility (R26.89);Muscle weakness (generalized) (M62.81);Difficulty in walking, not elsewhere classified (R26.2);Pain Pain -  Right/Left: Left Pain - part of body: Knee    Time: 2336-1224 PT Time Calculation (min) (ACUTE ONLY): 25 min   Charges:   PT Evaluation $PT Eval Low Complexity: 1 Low PT Treatments $Therapeutic Exercise: 8-22 mins        Eleanore Junio M. Fairly IV, PT, DPT Physical Therapist- [facility]  01/04/2022, 3:40 PM

## 2022-01-04 NOTE — Progress Notes (Signed)
Met with the patient in the room at the bedside The patient lives at Home with her husband that is in a wheelchair The patient  currently has DME rolling walker, raised toilet, shower seat The patient will need a 3 in 1 to be delivered by [employer] to the bedside They have transportation with her niece to go home They can afford their medication  They are set up with [facility] for Home health services

## 2022-01-04 NOTE — H&P (Signed)
The patient has been re-examined, and the chart reviewed, and there have been no interval changes to the documented history and physical.  Plan a left total knee today.  Anesthesia is consulted regarding a peripheral nerve block for post-operative pain.  The risks, benefits, and alternatives have been discussed at length, and the patient is willing to proceed.     

## 2022-01-04 NOTE — Transfer of Care (Signed)
Immediate Anesthesia Transfer of Care Note  Patient: [name]  Procedure(s) Performed: TOTAL KNEE ARTHROPLASTY (Left: Knee)  Patient Location: PACU  Anesthesia Type:General, Spinal   Level of Consciousness: awake  Airway & Oxygen Therapy: Patient Spontanous Breathing and Patient connected to face mask oxygen  Post-op Assessment: Report given to RN and Post -op Vital signs reviewed and stable  Post vital signs: Reviewed and stable  Last Vitals:  Vitals Value Taken Time  BP 131/68 0952  Temp 35.9 0952  Pulse 81 01/04/22 0952  Resp 18 01/04/22 0952  SpO2 98 % 01/04/22 0952  Vitals shown include unvalidated device data.  Last Pain:  Vitals:   01/04/22 0625  TempSrc: Temporal  PainSc: 0-No pain         Complications: No notable events documented.

## 2022-01-04 NOTE — Anesthesia Procedure Notes (Signed)
Anesthesia Regional Block: Adductor canal block   Pre-Anesthetic Checklist: , timeout performed,  Correct Patient, Correct Site, Correct Laterality,  Correct Procedure,, risks and benefits discussed,  Surgical consent,  Pre-op evaluation,  At surgeon's request and post-op pain management  Laterality: Left  Prep: chloraprep       Needles:  Injection technique: Single-shot  Needle Type: Stimiplex          Additional Needles:   Procedures:,,,, ultrasound used (permanent image in chart),,   Motor weakness within 20 minutes.  Narrative:  Start time: 01/04/2022 7:19 AM End time: 01/04/2022 7:20 AM Injection made incrementally with aspirations every 5 mL.  Performed by: Personally  Anesthesiologist: Darrin Nipper, MD  Additional Notes: Functioning IV was confirmed and monitors applied.  Sterile prep and drape, hand hygiene and sterile gloves were used. Ultrasound guidance: relevant anatomy identified, needle position confirmed, local anesthetic spread visualized around nerve(s), vascular puncture avoided.  Image saved to electronic medical record.  Negative aspiration prior to incremental administration of local anesthetic for total 20 ml bupivacaine 0.5% given in adductor saphenous distribution. The patient tolerated the procedure well. Vital signs and moderate sedation medications recorded in RN notes.

## 2022-01-05 LAB — GLUCOSE, CAPILLARY: Glucose-Capillary: 140 mg/dL — ABNORMAL HIGH (ref 70–99)

## 2022-01-05 MED ORDER — METHOCARBAMOL 750 MG PO TABS: 750.0000 mg | ORAL_TABLET | Freq: Three times a day (TID) | ORAL | 0 refills | Status: DC | PRN

## 2022-01-05 MED ORDER — DOCUSATE SODIUM 100 MG PO CAPS: 100.0000 mg | ORAL_CAPSULE | Freq: Two times a day (BID) | ORAL | 0 refills | Status: DC

## 2022-01-05 MED ORDER — HYDROCODONE-ACETAMINOPHEN 5-325 MG PO TABS
1.0000 | ORAL_TABLET | ORAL | 0 refills | Status: DC | PRN
Start: 2022-01-05 — End: 2024-03-30

## 2022-01-05 MED ORDER — ASPIRIN 81 MG PO CHEW: 81.0000 mg | CHEWABLE_TABLET | Freq: Two times a day (BID) | ORAL | 0 refills | Status: DC

## 2022-01-05 NOTE — Discharge Summary (Signed)
Physician Discharge Summary  Patient ID: [name] MRN: [id number] DOB/AGE: 03-Mar-1956 66 y.o.  Admit date: 01/04/2022 Discharge date: 01/05/2022  Admission Diagnoses:  M17.12 Unilateral primary osteoarthritis, left knee S/P TKR (total knee replacement) using cement, left  Discharge Diagnoses:  M17.12 Unilateral primary osteoarthritis, left knee Principal Problem:   S/P TKR (total knee replacement) using cement, left   Past Medical History:  Diagnosis Date   Anemia    Anxiety    Arthritis    Asthma    Benign essential tremor    CAD (coronary artery disease)    a.) MV 01/01/2019: EF 47%; reversible ant ischemia; b.) cCTA 01/18/2019: Ca score 200; <25& p-m LAD,diag, LCx, oRCA, p-dRCA; FFR CT analysis: LAD 0.87, LCx 0.95, RCA 0.91.   Claustrophobia    Complication of anesthesia    a.) delayed emergence; b.) PONV   Depression    Diastolic dysfunction 35/45/6256   a.) TTE 02/16/2017: EF >55%, triv MR/PR, mild TR, G1DD; b.) TTE 02/13/2019: EF 45%, mild LVH, septal and apical HK, triv MR, mild TR.   Dyspnea    Fibromyalgia    Fusion of spine of cervical region    Gastric ulcer    GERD (gastroesophageal reflux disease)    Hemorrhoids    Hepatic steatosis    History of chicken pox    History of motion sickness    History of pleurisy    History of seasonal allergies    Hyperlipidemia    Hypertension    Hypothyroidism    Leukocytosis    Migraines    Palpitations    PONV (postoperative nausea and vomiting)    Radiculopathy of cervical region    Snores    a.) has had negative PSG testing   Type 2 diabetes mellitus treated with insulin ([place])     Surgeries: Procedure(s): TOTAL KNEE ARTHROPLASTY on 01/04/2022   Consultants (if any):   Discharged Condition: Improved  Hospital Course: [name] is an 66 y.o. female who was admitted 01/04/2022 with a diagnosis of  M17.12 Unilateral primary osteoarthritis, left knee S/P TKR (total knee replacement) using cement, left and went  to the operating room on 01/04/2022 and underwent the above named procedures.    She was given perioperative antibiotics:  Anti-infectives (From admission, onward)    Start     Dose/Rate Route Frequency Ordered Stop   01/04/22 1330  ceFAZolin (ANCEF) IVPB 2g/100 mL premix        2 g 200 mL/hr over 30 Minutes Intravenous Every 6 hours 01/04/22 1109 01/04/22 1904   01/04/22 0634  ceFAZolin (ANCEF) 2-4 GM/100ML-% IVPB       Note to Pharmacy: Maryagnes Amos B: cabinet override      01/04/22 0634 01/04/22 0811   01/04/22 0600  ceFAZolin (ANCEF) IVPB 2g/100 mL premix        2 g 200 mL/hr over 30 Minutes Intravenous On call to O.R. 01/03/22 2241 01/04/22 0800     .  She was given sequential compression devices, early ambulation, and aspirin 81 mg twice daily for 30 days for DVT prophylaxis.  She benefited maximally from the hospital stay and there were no complications.    Recent vital signs:  Vitals:   01/05/22 0432 01/05/22 0722  BP: (!) 114/49 136/68  Pulse: 79 81  Resp: 14 14  Temp: 98 F (36.7 C) (!) 97.4 F (36.3 C)  SpO2: 90% 90%    Recent laboratory studies:  Lab Results  Component Value Date  HGB 13.4 12/23/2021   HGB 13.8 11/27/2021   HGB 13.4 11/28/2020   Lab Results  Component Value Date   WBC 13.5 (H) 12/23/2021   PLT 292 12/23/2021   Lab Results  Component Value Date   INR 1.0 11/14/2013   Lab Results  Component Value Date   NA 138 12/23/2021   K 3.3 (L) 12/23/2021   CL 106 12/23/2021   CO2 24 12/23/2021   BUN 13 12/23/2021   CREATININE 0.79 12/23/2021   GLUCOSE 202 (H) 12/23/2021    Discharge Medications:   Allergies as of 01/05/2022       Reactions   Cat Hair Extract Other (See Comments), Itching   Congestion, watery eyes    Oxycodone Other (See Comments)   Withdrawal symptoms after stopping it from last knee surgery   Statins Other (See Comments)   Zosyn [piperacillin Sod-tazobactam So] Swelling   Face got red and hot          Medication List     STOP taking these medications    acetaminophen 500 MG tablet Commonly known as: TYLENOL       TAKE these medications    amLODipine 10 MG tablet Commonly known as: NORVASC Take by mouth.   aspirin 81 MG chewable tablet Chew 1 tablet (81 mg total) by mouth 2 (two) times daily.   citalopram 10 MG tablet Commonly known as: CELEXA Take 1 tablet by mouth daily.   docusate sodium 100 MG capsule Commonly known as: COLACE Take 1 capsule (100 mg total) by mouth 2 (two) times daily.   ezetimibe 10 MG tablet Commonly known as: ZETIA Take 10 mg by mouth daily.   gabapentin 300 MG capsule Commonly known as: NEURONTIN Take 300 mg by mouth as needed.   glimepiride 1 MG tablet Commonly known as: AMARYL Take 1 mg by mouth daily with breakfast. Take 1/2 in the morning and 1/2 in the evening   HYDROcodone-acetaminophen 5-325 MG tablet Commonly known as: NORCO/VICODIN Take 1 tablet by mouth every 4 (four) hours as needed for moderate pain (pain score 4-6).   insulin glargine 100 UNIT/ML Solostar Pen Commonly known as: LANTUS Inject 10 Units into the skin daily.   methocarbamol 750 MG tablet Commonly known as: Robaxin-750 Take 1 tablet (750 mg total) by mouth every 8 (eight) hours as needed for muscle spasms.   olmesartan 40 MG tablet Commonly known as: BENICAR Take 1 tablet by mouth 2 (two) times daily.   pantoprazole 40 MG tablet Commonly known as: PROTONIX Take 40 mg by mouth daily.   spironolactone 25 MG tablet Commonly known as: ALDACTONE Take 25 mg by mouth in the morning.               Durable Medical Equipment  (From admission, onward)           Start     Ordered   01/05/22 1148  For home use only DME 3 n 1  Once        01/05/22 1147   01/05/22 1148  For home use only DME Walker rolling  Once       Question Answer Comment  Walker: With 5 Inch Wheels   Patient needs a walker to treat with the following condition  Osteoarthritis of left knee      01/05/22 1147            Diagnostic Studies: DG Knee Left Port  Result Date: 01/04/2022 CLINICAL DATA:  Status post knee arthroplasty  EXAM: PORTABLE LEFT KNEE - 1-2 VIEW COMPARISON:  11/12/2018 MRI. FINDINGS: Left knee arthroplasty. Expected anterior edema, gas, and surgical staples. No periprosthetic fracture or acute hardware complication. Intra-articular gas. IMPRESSION: Expected appearance after left knee arthroplasty. Electronically Signed   By: Abigail Miyamoto M.D.   On: 01/04/2022 10:08   [place] OR NERVE BLOCK-IMAGE ONLY [facility])  Result Date: 01/04/2022 There is no interpretation for this exam.  This order is for images obtained during a surgical procedure.  Please See "Surgeries" Tab for more information regarding the procedure.    Disposition: Discharge disposition: 01-Home or Self Care            Signed: Carlynn Spry ,PA-C 01/05/2022, 11:49 AM

## 2022-01-05 NOTE — Evaluation (Signed)
Occupational Therapy Evaluation Patient Details Name: [name] MRN: [id number] DOB: 1955-11-07 Today's Date: 01/05/2022   History of Present Illness Pt is s/p elective L TKA.   Clinical Impression   Pt seen for OT evaluation this date, POD#1 from above surgery. Pt was independent in all ADLs prior to surgery, although with significant L knee pain. Pt is eager to return to PLOF with less pain and improved safety and independence. During today's evaluation, pt endorses 7/10 pain. Pt currently requires SUPV-CGA for bed mobility and transfers, using RW. She requires Min A for toileting. Pt instructed in polar care mgt, falls prevention strategies, home/routines modifications, pain mgmt, and DME/AE for LB bathing and dressing tasks. Pt would benefit from an additional OT sessions while hospitalized to support carryover and recall of this information. 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 Prior to DC, she would benefit from being able to work with OT services to practice how to perform some of her caregiving tasks (e.g., changing diapers, assisting with WC transfers) while using a RW and managing her own pain and safety. Do not currently anticipate any OT needs following this hospitalization.       Recommendations for follow up therapy are one component of a multi-disciplinary discharge planning process, led by the attending physician.  Recommendations may be updated based on patient status, additional functional criteria and insurance authorization.   Follow Up Recommendations  No OT follow up    Assistance Recommended at Discharge Intermittent Supervision/Assistance  Patient can return home with the following A little help with walking and/or transfers;A little help with bathing/dressing/bathroom;Assistance with cooking/housework    Functional Status  Assessment  Patient has had a recent decline in their functional status and demonstrates the ability to make significant improvements in function in a reasonable and predictable amount of time.  Equipment Recommendations  None recommended by OT    Recommendations for Other Services       Precautions / Restrictions Precautions Precautions: Fall;Knee Precaution Booklet Issued: Yes (comment) Restrictions Weight Bearing Restrictions: Yes LLE Weight Bearing: Weight bearing as tolerated      Mobility Bed Mobility Overal bed mobility: Needs Assistance Bed Mobility: Sit to Supine       Sit to supine: Min assist, HOB elevated   General bed mobility comments: Min A for elevating LLE into bed    Transfers Overall transfer level: Needs assistance Equipment used: Rolling walker (2 wheels) Transfers: Sit to/from Stand Sit to Stand: Min guard           General transfer comment: VCs for hand placement on RW, close SUPV for safety      Balance Overall balance assessment: Needs assistance Sitting-balance support: Bilateral upper extremity supported, Feet supported Sitting balance-Leahy Scale: Good     Standing balance support: Bilateral upper extremity supported, During functional activity, Reliant on assistive device for balance Standing balance-Leahy Scale: Fair                             ADL either performed or assessed with clinical judgement   ADL Overall ADL's : Needs assistance/impaired                         Toilet Transfer: Minimal assistance;BSC/3in1;Rolling walker (2 wheels) Toilet Transfer Details (indicate cue  type and reason): extra time, effort required Toileting- Clothing Manipulation and Hygiene: Minimal assistance               Vision         Perception     Praxis      Pertinent Vitals/Pain Pain Assessment Pain Score: 7  Pain Location: L knee Pain Descriptors / Indicators: Discomfort, Grimacing, Guarding Pain  Intervention(s): Limited activity within patient's tolerance, Repositioned, Monitored during session, Ice applied     Hand Dominance     Extremity/Trunk Assessment Upper Extremity Assessment Upper Extremity Assessment: Overall WFL for tasks assessed   Lower Extremity Assessment Lower Extremity Assessment: Generalized weakness;LLE deficits/detail LLE Deficits / Details: L TKA   Cervical / Trunk Assessment Cervical / Trunk Assessment: Normal   Communication Communication Communication: No difficulties   Cognition Arousal/Alertness: Awake/alert Behavior During Therapy: WFL for tasks assessed/performed Overall Cognitive Status: Within Functional Limits for tasks assessed                                 General Comments: Pt is A and O x 4. Supportive niece in room throughout, although she is not available to assist at pt's home (niece lives in [place]).     General Comments  reviewed/re-educated pt on importance of stretching, ther ex for strengthening, and use of polar care to reduce inflammation    Exercises Other Exercises Other Exercises: Educ re: polar care mgmt, falls prevention, routines modification, correct use of DME   Shoulder Instructions      Home Living Family/patient expects to be discharged to:: Private residence Living Arrangements: Spouse/significant other;Children Available Help at Discharge: Family Type of Home: House Home Access: Stairs to enter;Ramped entrance Entrance Stairs-Number of Steps: 6 STE on front porch, ramp at back entrance   Home Layout: One level     Bathroom Shower/Tub: Occupational psychologist: Handicapped height     Home Equipment: Conservation officer, nature (2 wheels);BSC/3in1;Shower seat   Additional Comments: Pt has a spouse in poor health who uses a wheelchair, as well as an adult son with significant disabilities, and pt is caregiver for both of them.      Prior Functioning/Environment Prior Level of Function  : Independent/Modified Independent                        OT Problem List: Decreased strength;Decreased range of motion;Decreased activity tolerance;Decreased knowledge of use of DME or AE;Pain      OT Treatment/Interventions: Self-care/ADL training;Patient/family education;Therapeutic exercise;Balance training;Energy conservation;Therapeutic activities;DME and/or AE instruction    OT Goals(Current goals can be found in the care plan section) Acute Rehab OT Goals Patient Stated Goal: to get back to routine at home OT Goal Formulation: With patient Time For Goal Achievement: 01/19/22 Potential to Achieve Goals: Good ADL Goals Pt Will Perform Lower Body Dressing: with modified independence;sitting/lateral leans;sit to/from stand Pt Will Transfer to Toilet: with modified independence;ambulating;stand pivot transfer (using LRAD) Pt Will Perform Toileting - Clothing Manipulation and hygiene: with modified independence;sit to/from stand;sitting/lateral leans  OT Frequency: Min 2X/week    Co-evaluation              AM-PAC OT "6 Clicks" Daily Activity     Outcome Measure Help from another person eating meals?: None Help from another person taking care of personal grooming?: A Little Help from another person toileting, which includes using toliet, bedpan, or urinal?:  A Little Help from another person bathing (including washing, rinsing, drying)?: A Little Help from another person to put on and taking off regular upper body clothing?: A Little Help from another person to put on and taking off regular lower body clothing?: A Lot 6 Click Score: 18   End of Session Equipment Utilized During Treatment: Rolling walker (2 wheels)  Activity Tolerance: Patient tolerated treatment well;Patient limited by pain Patient left: in bed;with call bell/phone within reach;with family/visitor present  OT Visit Diagnosis: Unsteadiness on feet (R26.81);Muscle weakness (generalized)  (M62.81);Pain Pain - Right/Left: Left Pain - part of body: Knee                Time: 9767-3419 OT Time Calculation (min): 17 min Charges:  OT General Charges $OT Visit: 1 Visit OT Evaluation $OT Eval Low Complexity: 1 Low OT Treatments $Self Care/Home Management : 8-22 mins Josiah Lobo, PhD, MS, OTR/L 01/05/22, 12:14 PM

## 2022-01-05 NOTE — Progress Notes (Signed)
Physical Therapy Treatment Patient Details Name: [name] MRN: [id number] DOB: [birth year] Today's Date: 01/05/2022   History of Present Illness Pt is s/p elective L TKA.    PT Comments    Pt was long sitting in bed with supportive niece at bedside. Sh endorses 7/10 pain however was cooperative throughout. Discussed upcoming DC (planned for tomorrow) and any barriers. Both pt and niece state confidence and are planning to DC home after PT tomorrow. She was able to exit bed, stand to RW and ambulate ~ 35f. Limited by pain but no LOB. Demonstrated ~ 82 degrees flexion with lacking ~ 3 degrees extension. Highly recommend DC home with HHPT to follow.      Recommendations for follow up therapy are one component of a multi-disciplinary discharge planning process, led by the attending physician.  Recommendations may be updated based on patient status, additional functional criteria and insurance authorization.  Follow Up Recommendations  Home health PT     Assistance Recommended at Discharge Intermittent Supervision/Assistance  Patient can return home with the following A little help with walking and/or transfers;A little help with bathing/dressing/bathroom;Assistance with cooking/housework;Assist for transportation;Help with stairs or ramp for entrance   Equipment Recommendations  None recommended by PT       Precautions / Restrictions Precautions Precautions: Fall;Knee Precaution Booklet Issued: Yes (comment) Restrictions Weight Bearing Restrictions: Yes LLE Weight Bearing: Weight bearing as tolerated     Mobility  Bed Mobility Overal bed mobility: Needs Assistance Bed Mobility: Supine to Sit, Sit to Supine     Supine to sit: Min guard Sit to supine: Min assist  General bed mobility comments: pt required min assist to return to supine from EOB. no physicl assistance to exit L sid eof bed    Transfers Overall transfer level: Needs assistance Equipment used: Rolling walker  (2 wheels) Transfers: Sit to/from Stand Sit to Stand: Min guard  General transfer comment: Vcs for imporved technique only    Ambulation/Gait Ambulation/Gait assistance: Supervision Gait Distance (Feet): 75 Feet Assistive device: Rolling walker (2 wheels) Gait Pattern/deviations: Step-through pattern, Antalgic Gait velocity: decreased     General Gait Details: pt was able to safely ambulate 774fwith RW      Balance Overall balance assessment: Needs assistance Sitting-balance support: Bilateral upper extremity supported, Feet supported Sitting balance-Leahy Scale: Good     Standing balance support: Bilateral upper extremity supported, During functional activity, Reliant on assistive device for balance Standing balance-Leahy Scale: Fair Standing balance comment: Is reliant on RW for dynamic balance activity       Cognition Arousal/Alertness: Awake/alert Behavior During Therapy: WFL for tasks assessed/performed Overall Cognitive Status: Within Functional Limits for tasks assessed      General Comments: Pt is A and O x 4. Supportive niece in room throughout, although she is not available to assist at pt's home (niece lives in [place]        Exercises Total Joint Exercises Goniometric ROM: 3-82    General Comments General comments (skin integrity, edema, etc.): Reviewed HEP and pt performed several exercises.      Pertinent Vitals/Pain Pain Assessment Pain Assessment: 0-10 Pain Score: 7  Faces Pain Scale: Hurts a little bit Pain Location: L knee Pain Descriptors / Indicators: Discomfort, Grimacing, Guarding Pain Intervention(s): Limited activity within patient's tolerance, Monitored during session, Premedicated before session, Repositioned     PT Goals (current goals can now be found in the care plan section) Acute Rehab PT Goals Patient Stated Goal: To go home Progress  towards PT goals: Progressing toward goals    Frequency    BID      PT Plan  Current plan remains appropriate       AM-PAC PT "6 Clicks" Mobility   Outcome Measure  Help needed turning from your back to your side while in a flat bed without using bedrails?: A Little Help needed moving from lying on your back to sitting on the side of a flat bed without using bedrails?: A Little Help needed moving to and from a bed to a chair (including a wheelchair)?: A Little Help needed standing up from a chair using your arms (e.g., wheelchair or bedside chair)?: A Little Help needed to walk in hospital room?: A Little Help needed climbing 3-5 steps with a railing? : A Little 6 Click Score: 18    End of Session Equipment Utilized During Treatment: Gait belt Activity Tolerance: Patient tolerated treatment well Patient left: in bed;with call bell/phone within reach;with bed alarm set Nurse Communication: Mobility status PT Visit Diagnosis: Other abnormalities of gait and mobility (R26.89);Muscle weakness (generalized) (M62.81);Difficulty in walking, not elsewhere classified (R26.2);Pain Pain - Right/Left: Left Pain - part of body: Knee     Time: 7622-6333 PT Time Calculation (min) (ACUTE ONLY): 36 min  Charges:  $Gait Training: 8-22 mins $Therapeutic Exercise: 8-22 mins                     Julaine Fusi PTA 01/05/22, 4:28 PM

## 2022-01-05 NOTE — Discharge Instructions (Signed)
Continue weight bear as tolerated on the left lower extremity.    Elevate the left lower extremity whenever possible and continue the polar care while elevating the extremity. Patient may shower. No bath or submerging the wound.    Take aspirin 81 mg twice daily for 30 days as directed for blood clot prevention.  Continue to work on knee range of motion exercises at home as instructed by physical therapy. Continue to use a walker for assistance with ambulation until cleared by physical therapy.  Call [phone] with any questions, such as fever > 101.5 degrees, drainage from the wound or shortness of breath. 

## 2022-01-05 NOTE — Care Management Obs Status (Signed)
[place] NOTIFICATION   Patient Details  Name: [name] MRN: [id number] Date of Birth: 03/19/1956   Medicare Observation Status Notification Given:  Yes    Conception Oms, RN 01/05/2022, 9:43 AM

## 2022-01-05 NOTE — Plan of Care (Signed)

## 2022-01-05 NOTE — Plan of Care (Signed)
  Problem: Education: Goal: Knowledge of General Education information will improve Description: Including pain rating scale, medication(s)/side effects and non-pharmacologic comfort measures 01/05/2022 0753 by Alferd Apa, RN Outcome: Progressing 01/05/2022 0753 by Alferd Apa, RN Outcome: Progressing   Problem: Health Behavior/Discharge Planning: Goal: Ability to manage health-related needs will improve 01/05/2022 0753 by Alferd Apa, RN Outcome: Progressing 01/05/2022 0753 by Alferd Apa, RN Outcome: Progressing   Problem: Clinical Measurements: Goal: Ability to maintain clinical measurements within normal limits will improve 01/05/2022 0753 by Alferd Apa, RN Outcome: Progressing 01/05/2022 0753 by Alferd Apa, RN Outcome: Progressing Goal: Will remain free from infection 01/05/2022 0753 by Alferd Apa, RN Outcome: Progressing 01/05/2022 0753 by Alferd Apa, RN Outcome: Progressing Goal: Diagnostic test results will improve 01/05/2022 0753 by Alferd Apa, RN Outcome: Progressing 01/05/2022 0753 by Alferd Apa, RN Outcome: Progressing Goal: Respiratory complications will improve 01/05/2022 0753 by Alferd Apa, RN Outcome: Progressing 01/05/2022 0753 by Alferd Apa, RN Outcome: Progressing Goal: Cardiovascular complication will be avoided 01/05/2022 0753 by Alferd Apa, RN Outcome: Progressing 01/05/2022 0753 by Alferd Apa, RN Outcome: Progressing   Problem: Activity: Goal: Risk for activity intolerance will decrease 01/05/2022 0753 by Alferd Apa, RN Outcome: Progressing 01/05/2022 0753 by Alferd Apa, RN Outcome: Progressing   Problem: Nutrition: Goal: Adequate nutrition will be maintained 01/05/2022 0753 by Alferd Apa, RN Outcome: Progressing 01/05/2022 0753 by Alferd Apa, RN Outcome: Progressing   Problem: Coping: Goal: Level of anxiety will decrease 01/05/2022 0753 by Alferd Apa, RN Outcome: Progressing 01/05/2022 0753 by Alferd Apa, RN Outcome: Progressing   Problem: Elimination: Goal: Will not experience complications related to bowel motility 01/05/2022 0753 by Alferd Apa, RN Outcome: Progressing 01/05/2022 0753 by Alferd Apa, RN Outcome: Progressing Goal: Will not experience complications related to urinary retention 01/05/2022 0753 by Alferd Apa, RN Outcome: Progressing 01/05/2022 0753 by Alferd Apa, RN Outcome: Progressing   Problem: Pain Managment: Goal: General experience of comfort will improve 01/05/2022 0753 by Alferd Apa, RN Outcome: Progressing 01/05/2022 0753 by Alferd Apa, RN Outcome: Progressing   Problem: Safety: Goal: Ability to remain free from injury will improve 01/05/2022 0753 by Alferd Apa, RN Outcome: Progressing 01/05/2022 0753 by Alferd Apa, RN Outcome: Progressing   Problem: Skin Integrity: Goal: Risk for impaired skin integrity will decrease 01/05/2022 0753 by Alferd Apa, RN Outcome: Progressing 01/05/2022 0753 by Alferd Apa, RN Outcome: Progressing

## 2022-01-05 NOTE — Progress Notes (Signed)
  Subjective:  Patient reports pain as mild to moderate.    Objective:   VITALS:   Vitals:   01/04/22 1705 01/04/22 2110 01/05/22 0432 01/05/22 0722  BP: (!) 124/56 (!) 129/57 (!) 114/49 136/68  Pulse: 70 70 79 81  Resp: $Remo'16 20 14 14  'ncgeb$ Temp: 98.1 F (36.7 C) 98 F (36.7 C) 98 F (36.7 C) (!) 97.4 F (36.3 C)  TempSrc:    Oral  SpO2: 93% 96% 90% 90%  Weight:      Height:        PHYSICAL EXAM:  Neurologically intact ABD soft Neurovascular intact Sensation intact distally Intact pulses distally Dorsiflexion/Plantar flexion intact Incision: dressing C/D/I No cellulitis present Compartment soft  LABS  Results for orders placed or performed during the hospital encounter of 01/04/22 (from the past 24 hour(s))  Glucose, capillary     Status: Abnormal   Collection Time: 01/04/22  5:06 PM  Result Value Ref Range   Glucose-Capillary 227 (H) 70 - 99 mg/dL  Glucose, capillary     Status: Abnormal   Collection Time: 01/05/22  8:21 AM  Result Value Ref Range   Glucose-Capillary 140 (H) 70 - 99 mg/dL    DG Knee Left Port  Result Date: 01/04/2022 CLINICAL DATA:  Status post knee arthroplasty EXAM: PORTABLE LEFT KNEE - 1-2 VIEW COMPARISON:  11/12/2018 MRI. FINDINGS: Left knee arthroplasty. Expected anterior edema, gas, and surgical staples. No periprosthetic fracture or acute hardware complication. Intra-articular gas. IMPRESSION: Expected appearance after left knee arthroplasty. Electronically Signed   By: Abigail Miyamoto M.D.   On: 01/04/2022 10:08   [place] OR NERVE BLOCK-IMAGE ONLY [facility])  Result Date: 01/04/2022 There is no interpretation for this exam.  This order is for images obtained during a surgical procedure.  Please See "Surgeries" Tab for more information regarding the procedure.    Assessment/Plan: 1 Day Post-Op   Principal Problem:   S/P TKR (total knee replacement) using cement, left   Advance diet Up with therapy Discharge home if PT goals met today   Carlynn Spry , PA-C 01/05/2022, 11:43 AM

## 2022-01-05 NOTE — Progress Notes (Signed)
Physical Therapy Treatment Patient Details Name: [name] MRN: [id number] DOB: Nov 23, 1955 Today's Date: 01/05/2022   History of Present Illness Pt is s/p elective L TKA.    PT Comments    Pt was long sitting in bed upon arriving. She is A and O x 4 and agreeable to session. Supportive/concerns niece present throughout. Pt was safely able to exit bed, stand to RW, and ambulate 200 ft. No LOB or safety concerns however niece very concerned about possible DC home this date. Pt's niece lives out of town and will only be in town for 1 day. 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 Discussed at length importance of ROM, strengthening, and overall continues OOB activity. She states understanding and is progressing well. Pt's niece requesting pt stay another night. Author explained that DC is up to Mds not therapist. Pryor Curia does recommend DC to home with HHPT to follow when deemed medically stable. Will return for PM session after lunchtime to address stairs and ROM deficits. Pt does have a ramp entry to home.   Recommendations for follow up therapy are one component of a multi-disciplinary discharge planning process, led by the attending physician.  Recommendations may be updated based on patient status, additional functional criteria and insurance authorization.  Follow Up Recommendations  Home health PT     Assistance Recommended at Discharge Intermittent Supervision/Assistance  Patient can return home with the following A little help with walking and/or transfers;A little help with bathing/dressing/bathroom;Assistance with cooking/housework;Assist for transportation;Help with stairs or ramp for entrance   Equipment Recommendations  None recommended by PT    Recommendations for Other Services       Precautions / Restrictions Precautions Precautions: Fall;Knee Precaution Booklet Issued: Yes (comment) Restrictions Weight Bearing Restrictions: Yes LLE Weight Bearing: Weight bearing as  tolerated     Mobility  Bed Mobility Overal bed mobility: Needs Assistance Bed Mobility: Supine to Sit, Sit to Supine  Supine to sit: HOB elevated, Supervision  General bed mobility comments: pt was able to achieve EOB short sit without physical assistance.    Transfers Overall transfer level: Needs assistance Equipment used: Rolling walker (2 wheels) Transfers: Sit to/from Stand Sit to Stand: Supervision    General transfer comment: Vcs for imporved technique only    Ambulation/Gait Ambulation/Gait assistance: Min guard, Supervision Gait Distance (Feet): 200 Feet Assistive device: Rolling walker (2 wheels) Gait Pattern/deviations: Step-through pattern Gait velocity: decreased     General Gait Details: Pt was able to ambulate 200 ft with ~ 4 standing rest. Vcs for posture correct and heel strike throughout    Balance Overall balance assessment: Needs assistance Sitting-balance support: Bilateral upper extremity supported, Feet supported Sitting balance-Leahy Scale: Good     Standing balance support: Bilateral upper extremity supported, During functional activity, Reliant on assistive device for balance Standing balance-Leahy Scale: Good       Cognition Arousal/Alertness: Awake/alert Behavior During Therapy: WFL for tasks assessed/performed Overall Cognitive Status: Within Functional Limits for tasks assessed      General Comments: Pt is A and O x 4. Supportive neice in room throughout           General Comments General comments (skin integrity, edema, etc.): reviewed/re-educated pt on importance of stretching, ther ex for strengthening, and use of polar care to reduce inflammation      Pertinent Vitals/Pain Pain Assessment Pain Assessment: 0-10 Pain Score: 5  Faces Pain Scale: Hurts a little bit Pain Location: L knee Pain Descriptors /  Indicators: Discomfort, Grimacing Pain Intervention(s): Limited activity within patient's tolerance, Monitored during  session, Premedicated before session, Repositioned, Ice applied     PT Goals (current goals can now be found in the care plan section) Acute Rehab PT Goals Patient Stated Goal: To go home Progress towards PT goals: Progressing toward goals    Frequency    BID      PT Plan Current plan remains appropriate       AM-PAC PT "6 Clicks" Mobility   Outcome Measure  Help needed turning from your back to your side while in a flat bed without using bedrails?: A Little Help needed moving from lying on your back to sitting on the side of a flat bed without using bedrails?: A Little Help needed moving to and from a bed to a chair (including a wheelchair)?: A Little Help needed standing up from a chair using your arms (e.g., wheelchair or bedside chair)?: A Little Help needed to walk in hospital room?: A Little Help needed climbing 3-5 steps with a railing? : A Little 6 Click Score: 18    End of Session   Activity Tolerance: Patient tolerated treatment well Patient left: in chair;with call bell/phone within reach;with chair alarm set;with family/visitor present Nurse Communication: Mobility status PT Visit Diagnosis: Other abnormalities of gait and mobility (R26.89);Muscle weakness (generalized) (M62.81);Difficulty in walking, not elsewhere classified (R26.2);Pain Pain - Right/Left: Left Pain - part of body: Knee     Time: 9935-7017 PT Time Calculation (min) (ACUTE ONLY): 34 min  Charges:  $Gait Training: 8-22 mins $Therapeutic Activity: 8-22 mins                     Julaine Fusi PTA 01/05/22, 11:14 AM

## 2022-01-06 DIAGNOSIS — Z87891 Personal history of nicotine dependence: Secondary | ICD-10-CM | POA: Diagnosis not present

## 2022-01-06 DIAGNOSIS — M1712 Unilateral primary osteoarthritis, left knee: Secondary | ICD-10-CM | POA: Diagnosis present

## 2022-01-06 DIAGNOSIS — E785 Hyperlipidemia, unspecified: Secondary | ICD-10-CM | POA: Diagnosis present

## 2022-01-06 DIAGNOSIS — K219 Gastro-esophageal reflux disease without esophagitis: Secondary | ICD-10-CM | POA: Diagnosis present

## 2022-01-06 DIAGNOSIS — I10 Essential (primary) hypertension: Secondary | ICD-10-CM | POA: Diagnosis present

## 2022-01-06 DIAGNOSIS — F419 Anxiety disorder, unspecified: Secondary | ICD-10-CM | POA: Diagnosis present

## 2022-01-06 DIAGNOSIS — Z981 Arthrodesis status: Secondary | ICD-10-CM | POA: Diagnosis not present

## 2022-01-06 DIAGNOSIS — Z79899 Other long term (current) drug therapy: Secondary | ICD-10-CM | POA: Diagnosis not present

## 2022-01-06 DIAGNOSIS — E119 Type 2 diabetes mellitus without complications: Secondary | ICD-10-CM | POA: Diagnosis present

## 2022-01-06 DIAGNOSIS — Z8249 Family history of ischemic heart disease and other diseases of the circulatory system: Secondary | ICD-10-CM | POA: Diagnosis not present

## 2022-01-06 DIAGNOSIS — Z794 Long term (current) use of insulin: Secondary | ICD-10-CM | POA: Diagnosis not present

## 2022-01-06 DIAGNOSIS — F32A Depression, unspecified: Secondary | ICD-10-CM | POA: Diagnosis present

## 2022-01-06 DIAGNOSIS — M797 Fibromyalgia: Secondary | ICD-10-CM | POA: Diagnosis present

## 2022-01-06 DIAGNOSIS — I2510 Atherosclerotic heart disease of native coronary artery without angina pectoris: Secondary | ICD-10-CM | POA: Diagnosis present

## 2022-01-06 DIAGNOSIS — Z807 Family history of other malignant neoplasms of lymphoid, hematopoietic and related tissues: Secondary | ICD-10-CM | POA: Diagnosis not present

## 2022-01-06 DIAGNOSIS — F40240 Claustrophobia: Secondary | ICD-10-CM | POA: Diagnosis present

## 2022-01-06 DIAGNOSIS — Z888 Allergy status to other drugs, medicaments and biological substances status: Secondary | ICD-10-CM | POA: Diagnosis not present

## 2022-01-06 MED ORDER — MORPHINE BOLUS VIA INFUSION: 1.0000 mg | Freq: Three times a day (TID) | INTRAVENOUS | Status: DC | PRN

## 2022-01-06 NOTE — Progress Notes (Signed)
Physical Therapy Treatment Patient Details Name: [name] MRN: [id number] DOB: 1956/04/10 Today's Date: 01/06/2022   History of Present Illness Pt is s/p elective L TKA.    PT Comments    Pt was supine in bed with LLE hanging off EOB. She endorses 7/10 pain however pain did not limit session. She was able to exit L side of bed with supervision only. Stood to [employer] with supervision and ambulated to rehab gym. Performed ascending/descending 4 stair with CGA prior to ambulation ~ 120 ft. No LOB however slow cadence. She returned to room and was repositioned in recliner post session. Will return after lunch to progress ROM, strength, an overall safety with ADLs. 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   Recommendations for follow up therapy are one component of a multi-disciplinary discharge planning process, led by the attending physician.  Recommendations may be updated based on patient status, additional functional criteria and insurance authorization.  Follow Up Recommendations  Home health PT     Assistance Recommended at Discharge Intermittent Supervision/Assistance  Patient can return home with the following A little help with walking and/or transfers;A little help with bathing/dressing/bathroom;Assistance with cooking/housework;Assist for transportation;Help with stairs or ramp for entrance   Equipment Recommendations  None recommended by PT       Precautions / Restrictions Precautions Precautions: Fall;Knee Precaution Booklet Issued: Yes (comment) Restrictions Weight Bearing Restrictions: Yes LLE Weight Bearing: Weight bearing as tolerated     Mobility  Bed Mobility Overal bed mobility: Needs Assistance Bed Mobility: Supine to Sit, Sit to Supine  Supine to sit: Supervision (Flat bed)  General bed mobility comments: Pt was able to exit L side of flat bed without physical assistance. Did  require vcs for sequencing    Transfers Overall transfer level: Needs assistance Equipment used: Rolling walker (2 wheels) Transfers: Sit to/from Stand Sit to Stand: Supervision    General transfer comment: pt stood without physical assistance from EOB. Bed height at lowest height. vcs for handplacem,ent and increased fwd wt shift    Ambulation/Gait Ambulation/Gait assistance: Supervision Gait Distance (Feet): 120 Feet Assistive device: Rolling walker (2 wheels) Gait Pattern/deviations: Step-through pattern, Antalgic Gait velocity: decreased     General Gait Details: pt does continue to have slow antalgic step to gait pattern but was able to safely walk~ 120 ft total. performed stairs prior to ambulating in hallway.   Stairs Stairs: Yes Stairs assistance: Min guard Stair Management: Two rails, Step to pattern, Forwards Number of Stairs: 4 General stair comments: Pt has ramp to enter home however was able to ascend/descend 4 stair with BUE support     Balance Overall balance assessment: Needs assistance Sitting-balance support: Bilateral upper extremity supported, Feet supported Sitting balance-Leahy Scale: Good     Standing balance support: Bilateral upper extremity supported, During functional activity, Reliant on assistive device for balance Standing balance-Leahy Scale: Fair Standing balance comment: Is reliant on RW for dynamic balance activity         Cognition Arousal/Alertness: Awake/alert Behavior During Therapy: WFL for tasks assessed/performed Overall Cognitive Status: Within Functional Limits for tasks assessed      General Comments: Pt is A and O x 4. Supportive niece in room throughout, although she is not available to assist at pt's home (niece lives in [place]).        Exercises Total Joint Exercises Goniometric ROM: 1-85    General Comments General  comments (skin integrity, edema, etc.): lengthy discussion about home modifications and ways  to make DC home safer.      Pertinent Vitals/Pain Pain Assessment Pain Assessment: 0-10 Pain Score: 6  Faces Pain Scale: Hurts little more Pain Location: L knee Pain Descriptors / Indicators: Discomfort, Grimacing, Guarding Pain Intervention(s): Limited activity within patient's tolerance, Monitored during session, Premedicated before session, Repositioned, Ice applied     PT Goals (current goals can now be found in the care plan section) Acute Rehab PT Goals Patient Stated Goal: go home with less pain Progress towards PT goals: Progressing toward goals    Frequency    BID      PT Plan Current plan remains appropriate       AM-PAC PT "6 Clicks" Mobility   Outcome Measure  Help needed turning from your back to your side while in a flat bed without using bedrails?: A Little Help needed moving from lying on your back to sitting on the side of a flat bed without using bedrails?: A Little Help needed moving to and from a bed to a chair (including a wheelchair)?: A Little Help needed standing up from a chair using your arms (e.g., wheelchair or bedside chair)?: A Little Help needed to walk in hospital room?: A Little Help needed climbing 3-5 steps with a railing? : A Little 6 Click Score: 18    End of Session Equipment Utilized During Treatment: Gait belt Activity Tolerance: Patient tolerated treatment well Patient left: in chair;with call bell/phone within reach;with chair alarm set;with family/visitor present Nurse Communication: Mobility status PT Visit Diagnosis: Other abnormalities of gait and mobility (R26.89);Muscle weakness (generalized) (M62.81);Difficulty in walking, not elsewhere classified (R26.2);Pain Pain - Right/Left: Left Pain - part of body: Knee     Time: 0755-0822 PT Time Calculation (min) (ACUTE ONLY): 27 min  Charges:  $Gait Training: 8-22 mins $Therapeutic Activity: 8-22 mins                    Julaine Fusi PTA 01/06/22, 11:48 AM

## 2022-01-06 NOTE — Plan of Care (Signed)

## 2022-01-06 NOTE — Plan of Care (Signed)
?  Problem: Clinical Measurements: ?Goal: Will remain free from infection ?Outcome: Progressing ?  ?

## 2022-01-06 NOTE — Progress Notes (Signed)
Occupational Therapy Treatment Patient Details Name: [name] MRN: [id number] DOB: March 15, 1956 Today's Date: 01/06/2022   History of present illness Pt is s/p elective L TKA.   OT comments  During today's OT session, pt initially endorses 9/10 pain sitting in recliner. By end of session, pt evaluates pain as "12/10." She worked on sit<>stand transfers, ambulation, toileting, bed mobility, simulation of caregiving tasks pt performs at home. Pt able to complete all with SUPV-CGA, with very small, slow steps and heavy reliance on bilateral UE/RW to maintain standing balance. Trialed 1-handed support on RW for a brief period, but pt then had a small LOB. Provided ongoing educ re: polar care mgt, falls prevention strategies, home/routines modifications. Pt verbalilzed understanding. No further OT required at this time, anticipate pt can DC to home.    Recommendations for follow up therapy are one component of a multi-disciplinary discharge planning process, led by the attending physician.  Recommendations may be updated based on patient status, additional functional criteria and insurance authorization.    Follow Up Recommendations  No OT follow up    Assistance Recommended at Discharge Intermittent Supervision/Assistance  Patient can return home with the following  A lot of help with bathing/dressing/bathroom;A little help with walking and/or transfers;Assistance with cooking/housework;Help with stairs or ramp for entrance   Equipment Recommendations  None recommended by OT    Recommendations for Other Services      Precautions / Restrictions Precautions Precautions: Fall;Knee Precaution Booklet Issued: Yes (comment) Restrictions Weight Bearing Restrictions: Yes LLE Weight Bearing: Weight bearing as tolerated       Mobility Bed Mobility Overal bed mobility: Needs Assistance Bed Mobility: Sit to Supine     Supine to sit: Supervision     General bed mobility comments: Able to  get into bed from L side, extra time and effort but no physical assistance required    Transfers Overall transfer level: Needs assistance Equipment used: Rolling walker (2 wheels) Transfers: Sit to/from Stand, Bed to chair/wheelchair/BSC Sit to Stand: Min guard           General transfer comment: increased time and effort     Balance Overall balance assessment: Needs assistance Sitting-balance support: Bilateral upper extremity supported, Feet supported Sitting balance-Leahy Scale: Good     Standing balance support: Bilateral upper extremity supported, During functional activity, Reliant on assistive device for balance Standing balance-Leahy Scale: Fair Standing balance comment: heavy reliance on RW/2-handed UE support to maintain standing balance                           ADL either performed or assessed with clinical judgement   ADL Overall ADL's : Needs assistance/impaired     Grooming: Wash/dry hands;Oral care;Brushing hair;Sitting;Min guard                   Toilet Transfer: Comfort height toilet;Min guard;Rolling walker (2 wheels) Toilet Transfer Details (indicate cue type and reason): extra time, effort required Toileting- Water quality scientist and Hygiene: Min guard;Sit to/from stand              Extremity/Trunk Assessment Upper Extremity Assessment Upper Extremity Assessment: Overall WFL for tasks assessed   Lower Extremity Assessment Lower Extremity Assessment: Generalized weakness;LLE deficits/detail LLE Deficits / Details: L TKA        Vision       Perception     Praxis      Cognition Arousal/Alertness: Awake/alert Behavior During Therapy: WFL for tasks  assessed/performed Overall Cognitive Status: Within Functional Limits for tasks assessed                                          Exercises Other Exercises Other Exercises: Educ re: polar care mgmt, falls prevention, routines modification, correct use  of DME, safety at home    Shoulder Instructions       General Comments lengthy discussion about home modifications and ways to make DC home safer.    Pertinent Vitals/ Pain       Pain Assessment Pain Score: 10-Worst pain ever Pain Location: L knee Pain Descriptors / Indicators: Discomfort, Grimacing, Guarding Pain Intervention(s): Repositioned, Premedicated before session, Ice applied  Home Living Family/patient expects to be discharged to:: Private residence Living Arrangements: Spouse/significant other;Children                                      Prior Functioning/Environment              Frequency  Min 2X/week        Progress Toward Goals  OT Goals(current goals can now be found in the care plan section)  Progress towards OT goals: Progressing toward goals  Acute Rehab OT Goals OT Goal Formulation: With patient Time For Goal Achievement: 01/19/22 Potential to Achieve Goals: Good  Plan Frequency remains appropriate;Discharge plan remains appropriate    Co-evaluation                 AM-PAC OT "6 Clicks" Daily Activity     Outcome Measure   Help from another person eating meals?: None Help from another person taking care of personal grooming?: A Little Help from another person toileting, which includes using toliet, bedpan, or urinal?: A Little Help from another person bathing (including washing, rinsing, drying)?: A Little Help from another person to put on and taking off regular upper body clothing?: A Little Help from another person to put on and taking off regular lower body clothing?: A Lot 6 Click Score: 18    End of Session Equipment Utilized During Treatment: Rolling walker (2 wheels)  OT Visit Diagnosis: Unsteadiness on feet (R26.81);Muscle weakness (generalized) (M62.81);Pain Pain - Right/Left: Left Pain - part of body: Knee   Activity Tolerance Patient tolerated treatment well;Patient limited by pain   Patient Left  in bed;with call bell/phone within reach;with bed alarm set   Nurse Communication Patient requests pain meds        Time: 7341-9379 OT Time Calculation (min): 39 min  Charges: OT General Charges $OT Visit: 1 Visit OT Treatments $Self Care/Home Management : 38-52 mins Josiah Lobo, PhD, MS, OTR/L 01/06/22, 1:14 PM

## 2022-01-06 NOTE — Progress Notes (Signed)
Physical Therapy Treatment Patient Details Name: [name] MRN: [id number] DOB: [birth year] Today's Date: 01/06/2022   History of Present Illness Pt is s/p elective L TKA.    PT Comments    Pt was long siting in bed upon arriving. She continues to demonstrate improving abilities and is planning to DC home tomorrow. Pt was safely able to exit bed, stand and ambulate with RW. She also tolerated performance of AROM knee flexion > 90 degrees several times. PT recommends DC home with HHPT to follow.     Recommendations for follow up therapy are one component of a multi-disciplinary discharge planning process, led by the attending physician.  Recommendations may be updated based on patient status, additional functional criteria and insurance authorization.  Follow Up Recommendations  Home health PT     Assistance Recommended at Discharge Intermittent Supervision/Assistance  Patient can return home with the following A little help with walking and/or transfers;A little help with bathing/dressing/bathroom;Assistance with cooking/housework;Assist for transportation;Help with stairs or ramp for entrance   Equipment Recommendations  None recommended by PT       Precautions / Restrictions Precautions Precautions: Fall;Knee Precaution Booklet Issued: Yes (comment) Restrictions Weight Bearing Restrictions: Yes LLE Weight Bearing: Weight bearing as tolerated     Mobility  Bed Mobility Overal bed mobility: Needs Assistance Bed Mobility: Supine to Sit, Sit to Supine     Supine to sit: Supervision Sit to supine: Min assist   General bed mobility comments: Pt was able to exit L side of flat bed without physical assistance. Did require vcs for sequencing    Transfers Overall transfer level: Needs assistance Equipment used: Rolling walker (2 wheels) Transfers: Sit to/from Stand Sit to Stand: Supervision    General transfer comment: pt stood without physical assistance from EOB. Bed  height at lowest height. vcs for handplacem,ent and increased fwd wt shift    Ambulation/Gait Ambulation/Gait assistance: Supervision Gait Distance (Feet): 150 Feet Assistive device: Rolling walker (2 wheels) Gait Pattern/deviations: Step-through pattern, Antalgic Gait velocity: decreased     General Gait Details: pt ambulated 150 ft with RW without LOB. does have slow cadence but is safe.   Stairs Stairs: Yes Stairs assistance: Min guard Stair Management: Two rails, Step to pattern, Forwards Number of Stairs: 4 General stair comments: Pt has ramp to enter home however was able to ascend/descend 4 stair with BUE support     Balance Overall balance assessment: Needs assistance Sitting-balance support: Bilateral upper extremity supported, Feet supported Sitting balance-Leahy Scale: Good     Standing balance support: Bilateral upper extremity supported, During functional activity, Reliant on assistive device for balance Standing balance-Leahy Scale: Fair Standing balance comment: heavy reliance on RW/2-handed UE support to maintain standing balance       Cognition Arousal/Alertness: Awake/alert Behavior During Therapy: WFL for tasks assessed/performed Overall Cognitive Status: Within Functional Limits for tasks assessed      General Comments: Pt is A and O x 4        Exercises Total Joint Exercises Goniometric ROM: 1-85    General Comments General comments (skin integrity, edema, etc.): pt was able to perform ROM exercises seated EOB. flexed knee past 90 degrees 3 xs. Discussed upcoming DC home tomorrow and both pt and neice state confidence      Pertinent Vitals/Pain Pain Assessment Pain Assessment: 0-10 Pain Score: 7  Faces Pain Scale: Hurts little more Pain Location: L knee Pain Descriptors / Indicators: Discomfort, Grimacing, Guarding Pain Intervention(s): Limited activity within patient's tolerance,  Monitored during session, Premedicated before session,  Repositioned    Home Living Family/patient expects to be discharged to:: Private residence Living Arrangements: Spouse/significant other;Children          PT Goals (current goals can now be found in the care plan section) Acute Rehab PT Goals Patient Stated Goal: go home with less pain Progress towards PT goals: Progressing toward goals    Frequency    BID      PT Plan Current plan remains appropriate       AM-PAC PT "6 Clicks" Mobility   Outcome Measure  Help needed turning from your back to your side while in a flat bed without using bedrails?: A Little Help needed moving from lying on your back to sitting on the side of a flat bed without using bedrails?: A Little Help needed moving to and from a bed to a chair (including a wheelchair)?: A Little Help needed standing up from a chair using your arms (e.g., wheelchair or bedside chair)?: A Little Help needed to walk in hospital room?: A Little Help needed climbing 3-5 steps with a railing? : A Little 6 Click Score: 18    End of Session Equipment Utilized During Treatment: Gait belt Activity Tolerance: Patient tolerated treatment well Patient left: with family/visitor present;in bed;with bed alarm set;with call bell/phone within reach Nurse Communication: Mobility status PT Visit Diagnosis: Other abnormalities of gait and mobility (R26.89);Muscle weakness (generalized) (M62.81);Difficulty in walking, not elsewhere classified (R26.2);Pain Pain - Right/Left: Left Pain - part of body: Knee     Time: 9201-0071 PT Time Calculation (min) (ACUTE ONLY): 25 min  Charges:  $Gait Training: 8-22 mins $Therapeutic Exercise: 8-22 mins                    Julaine Fusi PTA 01/06/22, 4:38 PM

## 2022-01-06 NOTE — Progress Notes (Signed)
  Subjective:  Patient reports pain as moderate.  Struggling last night with transfers.  Objective:   VITALS:   Vitals:   01/05/22 0432 01/05/22 0722 01/05/22 2055 01/06/22 0318  BP: (!) 114/49 136/68 (!) 142/75 (!) 127/52  Pulse: 79 81 87 84  Resp: $Remo'14 14 15 16  'dMVCJ$ Temp: 98 F (36.7 C) (!) 97.4 F (36.3 C) 98.5 F (36.9 C) 98.3 F (36.8 C)  TempSrc:  Oral    SpO2: 90% 90% 94% 91%  Weight:      Height:        PHYSICAL EXAM:  Neurologically intact ABD soft Neurovascular intact Sensation intact distally Intact pulses distally Dorsiflexion/Plantar flexion intact Incision: dressing C/D/I No cellulitis present Compartment soft  LABS  Results for orders placed or performed during the hospital encounter of 01/04/22 (from the past 24 hour(s))  Glucose, capillary     Status: Abnormal   Collection Time: 01/05/22  8:21 AM  Result Value Ref Range   Glucose-Capillary 140 (H) 70 - 99 mg/dL    DG Knee Left Port  Result Date: 01/04/2022 CLINICAL DATA:  Status post knee arthroplasty EXAM: PORTABLE LEFT KNEE - 1-2 VIEW COMPARISON:  11/12/2018 MRI. FINDINGS: Left knee arthroplasty. Expected anterior edema, gas, and surgical staples. No periprosthetic fracture or acute hardware complication. Intra-articular gas. IMPRESSION: Expected appearance after left knee arthroplasty. Electronically Signed   By: Abigail Miyamoto M.D.   On: 01/04/2022 10:08    Assessment/Plan: 2 Days Post-Op   Principal Problem:   S/P TKR (total knee replacement) using cement, left   Advance diet Up with therapy Discharge home when PT goals met today or tomorrow. Discontinued IV pain meds  Carlynn Spry , PA-C 01/06/2022, 7:55 AM

## 2022-01-07 LAB — GLUCOSE, CAPILLARY: Glucose-Capillary: 165 mg/dL — ABNORMAL HIGH (ref 70–99)

## 2022-01-07 NOTE — Progress Notes (Signed)
DISCHARGE NOTE:  Pt given discharge instructions, pr verbalized understanding. Pt waiting on niece for transportation.

## 2022-01-07 NOTE — Progress Notes (Signed)
  Subjective:  Patient reports pain as mild to moderate.    Objective:   VITALS:   Vitals:   01/06/22 1213 01/06/22 1700 01/06/22 1955 01/07/22 0346  BP: (!) 119/56 (!) 142/61 (!) 148/64 (!) 122/57  Pulse: 72 86 92 88  Resp: $Remo'16 16 15 16  'icEYi$ Temp: 98 F (36.7 C) 98.1 F (36.7 C) 98.1 F (36.7 C) 98 F (36.7 C)  TempSrc:      SpO2: 93% 91% 92% 91%  Weight:      Height:        PHYSICAL EXAM:  Neurologically intact ABD soft Neurovascular intact Sensation intact distally Intact pulses distally Dorsiflexion/Plantar flexion intact Incision: dressing C/D/I No cellulitis present Compartment soft  LABS  No results found for this or any previous visit (from the past 24 hour(s)).  No results found.  Assessment/Plan: 3 Days Post-Op   Principal Problem:   S/P TKR (total knee replacement) using cement, left   Advance diet Up with therapy Discharge home when PT goals met today  Carlynn Spry , PA-C 01/07/2022, 7:10 AM

## 2022-01-07 NOTE — Progress Notes (Signed)
Physical Therapy Treatment Patient Details Name: [name] MRN: [id number] DOB: [birth year] Today's Date: 01/07/2022   History of Present Illness Pt is s/p elective L TKA.    PT Comments    Pt was long sitting in bed upon arriving. She agrees to session and is cooperative throughout. Pt is cleared from an acute PT standpoint for safe DC home with HHPT to follow.   Recommendations for follow up therapy are one component of a multi-disciplinary discharge planning process, led by the attending physician.  Recommendations may be updated based on patient status, additional functional criteria and insurance authorization.  Follow Up Recommendations  Home health PT     Assistance Recommended at Discharge Intermittent Supervision/Assistance  Patient can return home with the following A little help with walking and/or transfers;A little help with bathing/dressing/bathroom;Assistance with cooking/housework;Assist for transportation;Help with stairs or ramp for entrance   Equipment Recommendations  None recommended by PT       Precautions / Restrictions Precautions Precautions: Fall;Knee Precaution Booklet Issued: Yes (comment) Restrictions Weight Bearing Restrictions: Yes LLE Weight Bearing: Weight bearing as tolerated     Mobility  Bed Mobility Overal bed mobility: Needs Assistance Bed Mobility: Supine to Sit, Sit to Supine    Supine to sit: Supervision     Transfers Overall transfer level: Needs assistance Equipment used: Rolling walker (2 wheels) Transfers: Sit to/from Stand Sit to Stand: Supervision       Ambulation/Gait Ambulation/Gait assistance: Supervision Gait Distance (Feet): 15 Feet Assistive device: Rolling walker (2 wheels) Gait Pattern/deviations: Step-through pattern, Antalgic Gait velocity: decreased     General Gait Details: pt ambulated 150 ft with RW without LOB. Does have slow cadence but is safe.   Stairs  General stair comments: pt performed  stair training previous date and states confidence in abilities to perform if needed. Has ramp entry to enter home.    Balance Overall balance assessment: Needs assistance Sitting-balance support: Bilateral upper extremity supported, Feet supported Sitting balance-Leahy Scale: Good     Standing balance support: Bilateral upper extremity supported, During functional activity, Reliant on assistive device for balance Standing balance-Leahy Scale: Fair Standing balance comment: reliant on RW for all standing dynamic activity         Cognition Arousal/Alertness: Awake/alert Behavior During Therapy: WFL for tasks assessed/performed Overall Cognitive Status: Within Functional Limits for tasks assessed        General Comments: Pt is A and O x 4        Exercises Total Joint Exercises Goniometric ROM: 0-87    General Comments General comments (skin integrity, edema, etc.): pt was educated on importance of continued OOB activity at home and need to continue to perform ROM.      Pertinent Vitals/Pain Pain Assessment Pain Assessment: 0-10 Pain Score: 7  Pain Location: L knee Pain Descriptors / Indicators: Discomfort, Grimacing, Guarding Pain Intervention(s): Limited activity within patient's tolerance, Monitored during session, Premedicated before session, Repositioned, Ice applied     PT Goals (current goals can now be found in the care plan section) Acute Rehab PT Goals Patient Stated Goal: go home with less pain Progress towards PT goals: Progressing toward goals    Frequency    BID      PT Plan Current plan remains appropriate       AM-PAC PT "6 Clicks" Mobility   Outcome Measure  Help needed turning from your back to your side while in a flat bed without using bedrails?: A Little Help needed moving from  lying on your back to sitting on the side of a flat bed without using bedrails?: A Little Help needed moving to and from a bed to a chair (including a  wheelchair)?: A Little Help needed standing up from a chair using your arms (e.g., wheelchair or bedside chair)?: A Little Help needed to walk in hospital room?: A Little Help needed climbing 3-5 steps with a railing? : A Little 6 Click Score: 18    End of Session Equipment Utilized During Treatment: Gait belt Activity Tolerance: Patient tolerated treatment well Patient left: in chair;with call bell/phone within reach;with chair alarm set Nurse Communication: Mobility status PT Visit Diagnosis: Other abnormalities of gait and mobility (R26.89);Muscle weakness (generalized) (M62.81);Difficulty in walking, not elsewhere classified (R26.2);Pain Pain - Right/Left: Left Pain - part of body: Knee     Time: 6060-0459 PT Time Calculation (min) (ACUTE ONLY): 23 min  Charges:  $Gait Training: 8-22 mins $Therapeutic Exercise: 8-22 mins                     Julaine Fusi PTA 01/07/22, 10:16 AM

## 2022-01-07 NOTE — Discharge Summary (Signed)
Physician Discharge Summary  Patient ID: [name] MRN: [id number] DOB/AGE: 68-Aug-1957 68 y.o.  Admit date: 01/04/2022 Discharge date: 01/07/2022  Admission Diagnoses:  M17.12 Unilateral primary osteoarthritis, left knee S/P TKR (total knee replacement) using cement, left  Discharge Diagnoses:  M17.12 Unilateral primary osteoarthritis, left knee Principal Problem:   S/P TKR (total knee replacement) using cement, left   Past Medical History:  Diagnosis Date   Anemia    Anxiety    Arthritis    Asthma    Benign essential tremor    CAD (coronary artery disease)    a.) MV 01/01/2019: EF 47%; reversible ant ischemia; b.) cCTA 01/18/2019: Ca score 200; <25& p-m LAD,diag, LCx, oRCA, p-dRCA; FFR CT analysis: LAD 0.87, LCx 0.95, RCA 0.91.   Claustrophobia    Complication of anesthesia    a.) delayed emergence; b.) PONV   Depression    Diastolic dysfunction 39/76/7341   a.) TTE 02/16/2017: EF >55%, triv MR/PR, mild TR, G1DD; b.) TTE 02/13/2019: EF 45%, mild LVH, septal and apical HK, triv MR, mild TR.   Dyspnea    Fibromyalgia    Fusion of spine of cervical region    Gastric ulcer    GERD (gastroesophageal reflux disease)    Hemorrhoids    Hepatic steatosis    History of chicken pox    History of motion sickness    History of pleurisy    History of seasonal allergies    Hyperlipidemia    Hypertension    Hypothyroidism    Leukocytosis    Migraines    Palpitations    PONV (postoperative nausea and vomiting)    Radiculopathy of cervical region    Snores    a.) has had negative PSG testing   Type 2 diabetes mellitus treated with insulin ([place])     Surgeries: Procedure(s): TOTAL KNEE ARTHROPLASTY on 01/04/2022   Consultants (if any):   Discharged Condition: Improved  Hospital Course: MARLYN TONDREAU is an 66 y.o. female who was admitted 01/04/2022 with a diagnosis of  M17.12 Unilateral primary osteoarthritis, left knee S/P TKR (total knee replacement) using cement, left and went  to the operating room on 01/04/2022 and underwent the above named procedures.   Slow moving with PT and pain control.  Doing better today.  She was given perioperative antibiotics:  Anti-infectives (From admission, onward)    Start     Dose/Rate Route Frequency Ordered Stop   01/04/22 1330  ceFAZolin (ANCEF) IVPB 2g/100 mL premix        2 g 200 mL/hr over 30 Minutes Intravenous Every 6 hours 01/04/22 1109 01/05/22 1154   01/04/22 0634  ceFAZolin (ANCEF) 2-4 GM/100ML-% IVPB       Note to Pharmacy: Maryagnes Amos B: cabinet override      01/04/22 0634 01/04/22 0811   01/04/22 0600  ceFAZolin (ANCEF) IVPB 2g/100 mL premix        2 g 200 mL/hr over 30 Minutes Intravenous On call to O.R. 01/03/22 2241 01/04/22 0800     .  She was given sequential compression devices, early ambulation, and aspirin 81 mg twice a day for 30 days for DVT prophylaxis.  She benefited maximally from the hospital stay and there were no complications.    Recent vital signs:  Vitals:   01/06/22 1955 01/07/22 0346  BP: (!) 148/64 (!) 122/57  Pulse: 92 88  Resp: 15 16  Temp: 98.1 F (36.7 C) 98 F (36.7 C)  SpO2: 92% 91%  Recent laboratory studies:  Lab Results  Component Value Date   HGB 13.4 12/23/2021   HGB 13.8 11/27/2021   HGB 13.4 11/28/2020   Lab Results  Component Value Date   WBC 13.5 (H) 12/23/2021   PLT 292 12/23/2021   Lab Results  Component Value Date   INR 1.0 11/14/2013   Lab Results  Component Value Date   NA 138 12/23/2021   K 3.3 (L) 12/23/2021   CL 106 12/23/2021   CO2 24 12/23/2021   BUN 13 12/23/2021   CREATININE 0.79 12/23/2021   GLUCOSE 202 (H) 12/23/2021    Discharge Medications:   Allergies as of 01/07/2022       Reactions   Cat Hair Extract Other (See Comments), Itching   Congestion, watery eyes    Oxycodone Other (See Comments)   Withdrawal symptoms after stopping it from last knee surgery   Statins Other (See Comments)   Zosyn [piperacillin  Sod-tazobactam So] Swelling   Face got red and hot         Medication List     STOP taking these medications    acetaminophen 500 MG tablet Commonly known as: TYLENOL       TAKE these medications    amLODipine 10 MG tablet Commonly known as: NORVASC Take by mouth.   aspirin 81 MG chewable tablet Chew 1 tablet (81 mg total) by mouth 2 (two) times daily.   citalopram 10 MG tablet Commonly known as: CELEXA Take 1 tablet by mouth daily.   docusate sodium 100 MG capsule Commonly known as: COLACE Take 1 capsule (100 mg total) by mouth 2 (two) times daily.   ezetimibe 10 MG tablet Commonly known as: ZETIA Take 10 mg by mouth daily.   gabapentin 300 MG capsule Commonly known as: NEURONTIN Take 300 mg by mouth as needed.   glimepiride 1 MG tablet Commonly known as: AMARYL Take 1 mg by mouth daily with breakfast. Take 1/2 in the morning and 1/2 in the evening   HYDROcodone-acetaminophen 5-325 MG tablet Commonly known as: NORCO/VICODIN Take 1 tablet by mouth every 4 (four) hours as needed for moderate pain (pain score 4-6).   insulin glargine 100 UNIT/ML Solostar Pen Commonly known as: LANTUS Inject 10 Units into the skin daily.   methocarbamol 750 MG tablet Commonly known as: Robaxin-750 Take 1 tablet (750 mg total) by mouth every 8 (eight) hours as needed for muscle spasms.   olmesartan 40 MG tablet Commonly known as: BENICAR Take 1 tablet by mouth 2 (two) times daily.   pantoprazole 40 MG tablet Commonly known as: PROTONIX Take 40 mg by mouth daily.   spironolactone 25 MG tablet Commonly known as: ALDACTONE Take 25 mg by mouth in the morning.               Durable Medical Equipment  (From admission, onward)           Start     Ordered   01/05/22 1148  For home use only DME 3 n 1  Once        01/05/22 1147   01/05/22 1148  For home use only DME Walker rolling  Once       Question Answer Comment  Walker: With 5 Inch Wheels   Patient  needs a walker to treat with the following condition Osteoarthritis of left knee      01/05/22 1147            Diagnostic Studies: DG Knee Left  Port  Result Date: 01/04/2022 CLINICAL DATA:  Status post knee arthroplasty EXAM: PORTABLE LEFT KNEE - 1-2 VIEW COMPARISON:  11/12/2018 MRI. FINDINGS: Left knee arthroplasty. Expected anterior edema, gas, and surgical staples. No periprosthetic fracture or acute hardware complication. Intra-articular gas. IMPRESSION: Expected appearance after left knee arthroplasty. Electronically Signed   By: Abigail Miyamoto M.D.   On: 01/04/2022 10:08   [place] OR NERVE BLOCK-IMAGE ONLY [facility])  Result Date: 01/04/2022 There is no interpretation for this exam.  This order is for images obtained during a surgical procedure.  Please See "Surgeries" Tab for more information regarding the procedure.    Disposition: Discharge disposition: 01-Home or Self Care            Signed: Carlynn Spry ,PA-C 01/07/2022, 7:11 AM

## 2022-02-11 ENCOUNTER — Encounter: Payer: Self-pay | Admitting: Orthopedic Surgery

## 2022-05-03 ENCOUNTER — Ambulatory Visit (LOCAL_COMMUNITY_HEALTH_CENTER): Payer: Medicare Other

## 2022-05-03 DIAGNOSIS — Z23 Encounter for immunization: Secondary | ICD-10-CM

## 2022-05-03 DIAGNOSIS — Z719 Counseling, unspecified: Secondary | ICD-10-CM

## 2022-05-03 NOTE — Progress Notes (Signed)
  Are you feeling sick today? No   Have you ever received a dose of COVID-19 Vaccine? [employer], [place], [place], [place], Other) Yes  If yes, which vaccine and how many doses?   4 doses Pfizer Did you bring the vaccination record card or other documentation?  Yes   Do you have a health condition or are undergoing treatment that makes you moderately or severely immunocompromised? This would include, but not be limited to: cancer, HIV, organ transplant, immunosuppressive therapy/high-dose corticosteroids, or moderate/severe primary immunodeficiency.  No  Have you received COVID-19 vaccine before or during hematopoietic cell transplant (HCT) or CAR-T-cell therapies? No  Have you ever had an allergic reaction to: (This would include a severe allergic reaction or a reaction that caused hives, swelling, or respiratory distress, including wheezing.) A component of a COVID-19 vaccine or a previous dose of COVID-19 vaccine? No   Have you ever had an allergic reaction to another vaccine (other thanCOVID-19 vaccine) or an injectable medication? (This would include a severe allergic reaction or a reaction that caused hives, swelling, or respiratory distress, including wheezing.)   No    Do you have a history of any of the following:  Myocarditis or Pericarditis No  Dermal fillers:  No  Multisystem Inflammatory Syndrome (MIS-C or MIS-A)? No  COVID-19 disease within the past 3 months? No  Vaccinated with monkeypox vaccine in the last 4 weeks? No   Vaccinated in vehicle due to son's medical condition.  Comirnaty +12Y IM in right deltoid and Fluzone HD IM in left deltoid. Patient elected to have both vaccines today. Tolerated well.  Private RN with patient and will monitor.  VIS provided.  [facility] updated and copy provided.

## 2022-06-14 ENCOUNTER — Other Ambulatory Visit: Payer: Self-pay | Admitting: Family Medicine

## 2022-06-14 DIAGNOSIS — Z1231 Encounter for screening mammogram for malignant neoplasm of breast: Secondary | ICD-10-CM

## 2022-06-26 ENCOUNTER — Emergency Department: Payer: Medicare Other

## 2022-06-26 ENCOUNTER — Other Ambulatory Visit: Payer: Self-pay

## 2022-06-26 ENCOUNTER — Emergency Department
Admission: EM | Admit: 2022-06-26 | Discharge: 2022-06-26 | Disposition: A | Discharge status: Home / Self Care | Payer: Medicare Other | Attending: Emergency Medicine | Admitting: Emergency Medicine

## 2022-06-26 DIAGNOSIS — B349 Viral infection, unspecified: Secondary | ICD-10-CM

## 2022-06-26 DIAGNOSIS — Z20822 Contact with and (suspected) exposure to covid-19: Secondary | ICD-10-CM | POA: Diagnosis not present

## 2022-06-26 DIAGNOSIS — I10 Essential (primary) hypertension: Secondary | ICD-10-CM | POA: Diagnosis not present

## 2022-06-26 DIAGNOSIS — R1084 Generalized abdominal pain: Secondary | ICD-10-CM | POA: Diagnosis present

## 2022-06-26 DIAGNOSIS — E119 Type 2 diabetes mellitus without complications: Secondary | ICD-10-CM | POA: Diagnosis not present

## 2022-06-26 DIAGNOSIS — R1031 Right lower quadrant pain: Secondary | ICD-10-CM | POA: Insufficient documentation

## 2022-06-26 DIAGNOSIS — R112 Nausea with vomiting, unspecified: Secondary | ICD-10-CM | POA: Diagnosis not present

## 2022-06-26 LAB — CBC WITH DIFFERENTIAL/PLATELET
Abs Immature Granulocytes: 0.07 10*3/uL (ref 0.00–0.07)
Basophils Absolute: 0.1 10*3/uL (ref 0.0–0.1)
Basophils Relative: 0 %
Eosinophils Absolute: 0.1 10*3/uL (ref 0.0–0.5)
Eosinophils Relative: 0 %
HCT: 43.9 % (ref 36.0–46.0)
Hemoglobin: 13.8 g/dL (ref 12.0–15.0)
Immature Granulocytes: 0 %
Lymphocytes Relative: 22 %
Lymphs Abs: 3.5 10*3/uL (ref 0.7–4.0)
MCH: 26.5 pg (ref 26.0–34.0)
MCHC: 31.4 g/dL (ref 30.0–36.0)
MCV: 84.3 fL (ref 80.0–100.0)
Monocytes Absolute: 0.8 10*3/uL (ref 0.1–1.0)
Monocytes Relative: 5 %
Neutro Abs: 11.8 10*3/uL — ABNORMAL HIGH (ref 1.7–7.7)
Neutrophils Relative %: 73 %
Platelets: 338 10*3/uL (ref 150–400)
RBC: 5.21 MIL/uL — ABNORMAL HIGH (ref 3.87–5.11)
RDW: 14.6 % (ref 11.5–15.5)
WBC: 16.3 10*3/uL — ABNORMAL HIGH (ref 4.0–10.5)
nRBC: 0 % (ref 0.0–0.2)

## 2022-06-26 LAB — URINALYSIS, ROUTINE W REFLEX MICROSCOPIC
Bilirubin Urine: NEGATIVE
Glucose, UA: NEGATIVE mg/dL
Hgb urine dipstick: NEGATIVE
Ketones, ur: 20 mg/dL — AB
Leukocytes,Ua: NEGATIVE
Nitrite: NEGATIVE
Protein, ur: 100 mg/dL — AB
Specific Gravity, Urine: 1.024 (ref 1.005–1.030)
pH: 5 (ref 5.0–8.0)

## 2022-06-26 LAB — COMPREHENSIVE METABOLIC PANEL
ALT: 17 U/L (ref 0–44)
AST: 24 U/L (ref 15–41)
Albumin: 4.2 g/dL (ref 3.5–5.0)
Alkaline Phosphatase: 94 U/L (ref 38–126)
Anion gap: 10 (ref 5–15)
BUN: 12 mg/dL (ref 8–23)
CO2: 26 mmol/L (ref 22–32)
Calcium: 9.5 mg/dL (ref 8.9–10.3)
Chloride: 104 mmol/L (ref 98–111)
Creatinine, Ser: 0.76 mg/dL (ref 0.44–1.00)
GFR, Estimated: >60 mL/min (ref >60)
Glucose, Bld: 141 mg/dL — ABNORMAL HIGH (ref 70–99)
Potassium: 3.5 mmol/L (ref 3.5–5.1)
Sodium: 140 mmol/L (ref 135–145)
Total Bilirubin: 1.1 mg/dL (ref 0.3–1.2)
Total Protein: 8.1 g/dL (ref 6.5–8.1)

## 2022-06-26 LAB — LIPASE, BLOOD: Lipase: 29 U/L (ref 11–51)

## 2022-06-26 LAB — RESP PANEL BY RT-PCR (RSV, FLU A&B, COVID)  RVPGX2
Influenza A by PCR: NEGATIVE
Influenza B by PCR: NEGATIVE
Resp Syncytial Virus by PCR: NEGATIVE
SARS Coronavirus 2 by RT PCR: NEGATIVE

## 2022-06-26 LAB — TROPONIN I (HIGH SENSITIVITY): Troponin I (High Sensitivity): 4 ng/L (ref <18)

## 2022-06-26 MED ORDER — KETOROLAC TROMETHAMINE 15 MG/ML IJ SOLN
15.0000 mg | Freq: Once | INTRAMUSCULAR | Status: AC
  Administered 2022-06-26: 15 mg via INTRAVENOUS
  Filled 2022-06-26: qty 1

## 2022-06-26 MED ORDER — METOCLOPRAMIDE HCL 10 MG PO TABS: 10.0000 mg | ORAL_TABLET | Freq: Four times a day (QID) | ORAL | 0 refills | Status: DC | PRN

## 2022-06-26 MED ORDER — ONDANSETRON HCL 4 MG/2ML IJ SOLN
4.0000 mg | Freq: Once | INTRAMUSCULAR | Status: AC
  Administered 2022-06-26: 4 mg via INTRAVENOUS
  Filled 2022-06-26: qty 2

## 2022-06-26 MED ORDER — NAPROXEN 500 MG PO TABS: 500.0000 mg | ORAL_TABLET | Freq: Two times a day (BID) | ORAL | 0 refills | Status: DC

## 2022-06-26 MED ORDER — FAMOTIDINE 20 MG PO TABS: 20.0000 mg | ORAL_TABLET | Freq: Two times a day (BID) | ORAL | 0 refills | Status: DC

## 2022-06-26 MED ORDER — SODIUM CHLORIDE 0.9 % IV SOLN
12.5000 mg | Freq: Once | INTRAVENOUS | Status: AC
  Administered 2022-06-26: 12.5 mg via INTRAVENOUS
  Filled 2022-06-26: qty 12.5

## 2022-06-26 MED ORDER — IOHEXOL 300 MG/ML  SOLN
100.0000 mL | Freq: Once | INTRAMUSCULAR | Status: AC | PRN
  Administered 2022-06-26: 100 mL via INTRAVENOUS

## 2022-06-26 MED ORDER — SODIUM CHLORIDE 0.9 % IV BOLUS
1000.0000 mL | Freq: Once | INTRAVENOUS | Status: AC
  Administered 2022-06-26: 1000 mL via INTRAVENOUS

## 2022-06-26 NOTE — ED Notes (Signed)
Pt given crackers and water for PO challenge at this time. Pt states she is calling her ride home.

## 2022-06-26 NOTE — ED Provider Triage Note (Signed)
Emergency Medicine Provider Triage Evaluation Note  [name] , a 66 y.o. female  was evaluated in triage.  Pt complains of lower abdominal pain, n/v/d. Also reports palpitations. Symptoms have been ongoing since Thursday. No sick contacts. No fever. No dysuria.    Review of Systems  Positive: Abd pain n/v/d Negative: Fever, cough  Physical Exam  LMP  (LMP Unknown)  Gen:   Awake, no distress   Resp:  Normal effort  MSK:   Moves extremities without difficulty  Other:    Medical Decision Making  Medically screening exam initiated at 11:33 AM.  Appropriate orders placed.  [name] was informed that the remainder of the evaluation will be completed by another provider, this initial triage assessment does not replace that evaluation, and the importance of remaining in the ED until their evaluation is complete.     Destiny Old, PA-C 06/26/22 1145

## 2022-06-26 NOTE — ED Notes (Signed)
Pt assisted to toilet and instructed on clean catch technique for UA sample- pt verbalizes understanding.

## 2022-06-26 NOTE — ED Provider Notes (Signed)
[facility] Provider Note    Event Date/Time   First MD Initiated Contact with Patient 06/26/22 1328     (approximate)   History   Chief Complaint: Chest Pain   HPI  [name] is a 66 y.o. female with a history of anxiety, depression, GERD, hypertension, migraines, diabetes who comes ED complaining of generalized abdominal pain with nausea and vomiting for the past 2 days.  She has had decreased oral intake.  Normal bowel movements, no constipation or diarrhea.  No fever.  No chest pain or shortness of breath.  She does have fatigue and some bodyaches as well.     Physical Exam   Triage Vital Signs: ED Triage Vitals  Enc Vitals Group     BP 06/26/22 1137 (!) 144/66     Pulse Rate 06/26/22 1137 75     Resp 06/26/22 1137 20     Temp 06/26/22 1137 98.7 F (37.1 C)     Temp Source 06/26/22 1137 Oral     SpO2 06/26/22 1137 96 %     Weight 06/26/22 1138 229 lb (103.9 kg)     Height 06/26/22 1138 '5\' 4"'$  (1.626 m)     Head Circumference --      Peak Flow --      Pain Score 06/26/22 1138 8     Pain Loc --      Pain Edu? --      Excl. in [place]? --     Most recent vital signs: Vitals:   06/26/22 1542 06/26/22 1600  BP:    Pulse:  79  Resp:    Temp: 98.6 F (37 C)   SpO2:  96%    General: Awake, no distress.  CV:  Good peripheral perfusion.  Regular rate and rhythm Resp:  Normal effort.  Clear to auscultation bilaterally Abd:  No distention.  Soft with mild generalized tenderness, most pronounced in the right lower quadrant. Other:  Somewhat dry mucous membranes.  No lower extremity edema.  No lymphadenopathy.  No neck tenderness.  Full range of motion in extremities and neck.   ED Results / Procedures / Treatments   Labs (all labs ordered are listed, but only abnormal results are displayed) Labs Reviewed  COMPREHENSIVE METABOLIC PANEL - Abnormal; Notable for the following components:      Result Value   Glucose, Bld 141 (*)    All other  components within normal limits  CBC WITH DIFFERENTIAL/PLATELET - Abnormal; Notable for the following components:   WBC 16.3 (*)    RBC 5.21 (*)    Neutro Abs 11.8 (*)    All other components within normal limits  URINALYSIS, ROUTINE W REFLEX MICROSCOPIC - Abnormal; Notable for the following components:   Color, Urine YELLOW (*)    APPearance HAZY (*)    Ketones, ur 20 (*)    Protein, ur 100 (*)    Bacteria, UA RARE (*)    All other components within normal limits  RESP PANEL BY RT-PCR (RSV, FLU A&B, COVID)  RVPGX2  LIPASE, BLOOD  TROPONIN I (HIGH SENSITIVITY)     EKG Interpreted by me Normal sinus rhythm rate of 78.  ST segments and T waves unremarkable.  Pronounced artifact limits interpretation.   RADIOLOGY Chest x-ray interpreted by me, appears normal.  Radiology report reviewed.  CT abdomen pelvis unremarkable   PROCEDURES:  Procedures   MEDICATIONS ORDERED IN ED: Medications  sodium chloride 0.9 % bolus 1,000 mL (0  mLs Intravenous Stopped 06/26/22 1608)  ondansetron (ZOFRAN) injection 4 mg (4 mg Intravenous Given 06/26/22 1441)  ketorolac (TORADOL) 15 MG/ML injection 15 mg (15 mg Intravenous Given 06/26/22 1441)  promethazine (PHENERGAN) 12.5 mg in sodium chloride 0.9 % 50 mL IVPB (0 mg Intravenous Stopped 06/26/22 1603)  iohexol (OMNIPAQUE) 300 MG/ML solution 100 mL (100 mLs Intravenous Contrast Given 06/26/22 1520)     IMPRESSION / MDM / ASSESSMENT AND PLAN / ED COURSE  I reviewed the triage vital signs and the nursing notes.                              Differential diagnosis includes, but is not limited to, appendicitis, viral syndrome, cystitis, diverticulitis, pancreatitis  Patient's presentation is most consistent with acute presentation with potential threat to life or bodily function.  Patient presents with multiple symptoms most likely a viral syndrome.  Vital signs unremarkable, respiratory viral swab is negative.  Serum labs and urinalysis  unremarkable except for mild leukocytosis of 16,000.  Chest x-ray and CT scan obtained which are both unremarkable.  Overall the workup is very reassuring, she is feeling better and tolerating oral intake after supportive care in the ED.  Does not require admission, can be discharged home to continue supportive care for likely self-limited viral illness.       FINAL CLINICAL IMPRESSION(S) / ED DIAGNOSES   Final diagnoses:  Generalized abdominal pain  Acute viral syndrome     Rx / DC Orders   ED Discharge Orders          Ordered    famotidine (PEPCID) 20 MG tablet  2 times daily        06/26/22 1626    metoCLOPramide (REGLAN) 10 MG tablet  Every 6 hours PRN        06/26/22 1626    naproxen (NAPROSYN) 500 MG tablet  2 times daily with meals        06/26/22 1626             Note:  This document was prepared using Dragon voice recognition software and may include unintentional dictation errors.   Carrie Mew, MD 06/26/22 1630

## 2022-06-26 NOTE — ED Triage Notes (Signed)
First Nurse Note: Pt via EMS from home. Pt c/o chest pain and palpitation. Pt endorses NV since Thursday. States that she has be unable to take her medications because of the NV. Pt is A&Ox4 and NAD  '4mg'$  of Zofran, 22G in L AC  136 CBG  98.4 oral temp 175/80 BP 84 NSR

## 2022-06-26 NOTE — ED Notes (Signed)
Patient transported to CT 

## 2022-06-26 NOTE — ED Triage Notes (Signed)
Pt states she has had nausea and lower abd pain since Thursday and is unable to sleep d/t pain in her chest- pt states she has not had anything to eat or drink since it started- pt denies urinary symptoms- pt denies taking her medications today

## 2022-07-14 ENCOUNTER — Ambulatory Visit
Admission: RE | Admit: 2022-07-14 | Discharge: 2022-07-14 | Disposition: A | Discharge status: Home / Self Care | Payer: Medicare Other | Source: Ambulatory Visit | Attending: Family Medicine | Admitting: Family Medicine

## 2022-07-14 DIAGNOSIS — Z1231 Encounter for screening mammogram for malignant neoplasm of breast: Secondary | ICD-10-CM | POA: Diagnosis present

## 2023-04-11 ENCOUNTER — Other Ambulatory Visit: Payer: Self-pay | Admitting: Neurology

## 2023-04-11 DIAGNOSIS — G20A1 Parkinson's disease without dyskinesia, without mention of fluctuations: Secondary | ICD-10-CM

## 2023-04-29 ENCOUNTER — Ambulatory Visit
Admission: RE | Admit: 2023-04-29 | Discharge: 2023-04-29 | Disposition: A | Discharge status: Home / Self Care | Payer: Medicare Other | Source: Ambulatory Visit | Attending: Neurology | Admitting: Neurology

## 2023-04-29 DIAGNOSIS — G20A1 Parkinson's disease without dyskinesia, without mention of fluctuations: Secondary | ICD-10-CM

## 2023-08-01 ENCOUNTER — Other Ambulatory Visit: Payer: Self-pay | Admitting: Orthopedic Surgery

## 2023-08-01 DIAGNOSIS — Z96652 Presence of left artificial knee joint: Secondary | ICD-10-CM

## 2023-08-03 ENCOUNTER — Other Ambulatory Visit: Payer: Self-pay

## 2023-08-09 ENCOUNTER — Encounter
Admission: RE | Admit: 2023-08-09 | Discharge: 2023-08-09 | Discharge status: Home / Self Care | Payer: Medicare Other | Source: Ambulatory Visit | Attending: Orthopedic Surgery

## 2023-08-09 ENCOUNTER — Encounter
Admission: RE | Admit: 2023-08-09 | Discharge: 2023-08-09 | Disposition: A | Discharge status: Home / Self Care | Payer: Medicare Other | Source: Ambulatory Visit | Attending: Orthopedic Surgery | Admitting: Orthopedic Surgery

## 2023-08-09 DIAGNOSIS — Z96652 Presence of left artificial knee joint: Secondary | ICD-10-CM | POA: Insufficient documentation

## 2023-08-09 MED ORDER — TECHNETIUM TC 99M MEDRONATE IV KIT
20.0000 | PACK | Freq: Once | INTRAVENOUS | Status: AC | PRN
  Administered 2023-08-09: 21.53 via INTRAVENOUS

## 2024-01-04 ENCOUNTER — Encounter: Payer: Self-pay | Admitting: Dietician

## 2024-01-04 ENCOUNTER — Encounter: Attending: Family Medicine | Admitting: Dietician

## 2024-01-04 DIAGNOSIS — Z713 Dietary counseling and surveillance: Secondary | ICD-10-CM | POA: Insufficient documentation

## 2024-01-04 DIAGNOSIS — Z794 Long term (current) use of insulin: Secondary | ICD-10-CM | POA: Diagnosis not present

## 2024-01-04 DIAGNOSIS — E1165 Type 2 diabetes mellitus with hyperglycemia: Secondary | ICD-10-CM | POA: Diagnosis present

## 2024-01-04 DIAGNOSIS — Z6841 Body Mass Index (BMI) 40.0 and over, adult: Secondary | ICD-10-CM | POA: Diagnosis not present

## 2024-01-04 NOTE — Progress Notes (Signed)
 Diabetes Self-Management Education  Visit Type: First/Initial  Appt. Start Time: 1115 Appt. End Time: 1215  01/04/2024  [name], identified by name and date of birth, is a 68 y.o. female with a diagnosis of Diabetes: Type 2.   ASSESSMENT Pt reports using Dexcom G7 for the last year, CGM report reviewed. CGM Results from download:   % Time CGM active:   90 days  Average glucose:   201 mg/dL for 90 days  Glucose management indicator:   8.1 %  Time in range (70-180 mg/dL):   41 %   (Goal >29%)  Time High (181-250 mg/dL):   37 %   (Goal < 74%)  Time Very High (>250 mg/dL):    21 %   (Goal < 5%)  Time Low (54-69 mg/dL):   1 %   (Goal <5%)  Time Very Low (<54 mg/dL):   0 %   (Goal <8%)   Pt reports taking Tresiba @25u  in the evening, started on Pioglitazone @30  mg once daily about a month ago, significant improvement in glycemic control noted on Dexcom Clarity since starting Pioglitazone. Pt reports battling Parkinson's disease, states that they feel has been progressing somewhat recently, losing balance more often. Pt reports being a stress eater, especially at night. Pt reports caring for special needs son at home, and husband's health problems have been contributing to high stress level. Pt reports skipping or missing meals frequently, tends to eat out for lunch more than any other meal. Pt reports switching to ZERO sugar sodas, drinks a lot of water  each day Pt reports having a weakness for bread, will have an occasional sweet tooth.    Diabetes Self-Management Education - 01/04/24 1129       Visit Information   Visit Type First/Initial      Initial Visit   Diabetes Type Type 2    Are you currently following a meal plan? No    Are you taking your medications as prescribed? Yes      Health Coping   How would you rate your overall health? Fair      Psychosocial Assessment   Patient Belief/Attitude about Diabetes Motivated to manage diabetes    What is the hardest part  about your diabetes right now, causing you the most concern, or is the most worrisome to you about your diabetes?   Taking/obtaining medications;Making healty food and beverage choices    Self-care barriers Debilitated state due to current medical condition   Parkinson's   Self-management support Doctor's office    Other persons present Patient    Patient Concerns Glycemic Control;Nutrition/Meal planning;Healthy Lifestyle    Special Needs None    Preferred Learning Style Hands on    Learning Readiness Ready    How often do you need to have someone help you when you read instructions, pamphlets, or other written materials from your doctor or pharmacy? 1 - Never    What is the last grade level you completed in school? 2 yrs college      Pre-Education Assessment   Patient understands the diabetes disease and treatment process. Needs Instruction    Patient understands incorporating nutritional management into lifestyle. Needs Instruction    Patient undertands incorporating physical activity into lifestyle. Needs Instruction    Patient understands using medications safely. Needs Review    Patient understands monitoring blood glucose, interpreting and using results Needs Review    Patient understands prevention, detection, and treatment of acute complications. Needs Instruction  Patient understands prevention, detection, and treatment of chronic complications. Needs Instruction    Patient understands how to develop strategies to address psychosocial issues. Needs Instruction    Patient understands how to develop strategies to promote health/change behavior. Needs Instruction      Complications   How often do you check your blood sugar? > 4 times/day   CGM   Fasting Blood glucose range (mg/dL) 29-870;869-820    Postprandial Blood glucose range (mg/dL) 869-820;819-799;>799    Have you had a dilated eye exam in the past 12 months? Yes    Have you had a dental exam in the past 12 months? Yes     Are you checking your feet? Yes    How many days per week are you checking your feet? 5      Dietary Intake   Breakfast [employer] bacon, scrambled eggs, biscuit and gravy, coffee w/ oatmilk creamer    Lunch 2 slices cheese pizza, Pepsi ZERO    Dinner Tuna salad sandwich on white wheat, tostito chips, water     Beverage(s) Water , Pepsi ZERO, Coffee      Activity / Exercise   Activity / Exercise Type ADL's    How many days per week do you exercise? 0    How many minutes per day do you exercise? 0    Total minutes per week of exercise 0      Patient Education   Previous Diabetes Education Yes (please comment)    Disease Pathophysiology Definition of diabetes, type 1 and 2, and the diagnosis of diabetes;Explored patient's options for treatment of their diabetes    Healthy Eating Role of diet in the treatment of diabetes and the relationship between the three main macronutrients and blood glucose level;Plate Method    Medications Reviewed patients medication for diabetes, action, purpose, timing of dose and side effects.    Monitoring Taught/evaluated CGM (comment);Identified appropriate SMBG and/or A1C goals.   TIR   Chronic complications Relationship between chronic complications and blood glucose control    Diabetes Stress and Support Role of stress on diabetes;Identified and addressed patients feelings and concerns about diabetes;Helped patient identify a support system for diabetes management      Individualized Goals (developed by patient)   Nutrition Follow meal plan discussed    Physical Activity Not Applicable    Medications take my medication as prescribed    Monitoring  Consistenly use CGM    Problem Solving Eating Pattern    Reducing Risk examine blood glucose patterns      Post-Education Assessment   Patient understands the diabetes disease and treatment process. Comprehends key points    Patient understands incorporating nutritional management into lifestyle. Comprehends key  points    Patient undertands incorporating physical activity into lifestyle. N/A    Patient understands using medications safely. Demonstrates understanding / competency    Patient understands monitoring blood glucose, interpreting and using results Comprehends key points    Patient understands prevention, detection, and treatment of acute complications. Comprehends key points    Patient understands prevention, detection, and treatment of chronic complications. Comprehends key points    Patient understands how to develop strategies to address psychosocial issues. Comprehends key points    Patient understands how to develop strategies to promote health/change behavior. Comprehends key points      Outcomes   Expected Outcomes Demonstrated interest in learning. Expect positive outcomes    Future DMSE 2 months    Program Status Not Completed  Individualized Plan for Diabetes Self-Management Training:   Learning Objective:  Patient will have a greater understanding of diabetes self-management. Patient education plan is to attend individual and/or group sessions per assessed needs and concerns.   Plan:   Patient Instructions  Check your blood sugar each morning before eating or drinking (fasting). Look for numbers between 70-130 mg/dL Check your blood sugar 2 hours after you begin eating a meal. Look for numbers under 180 mg/dL at all times.  Your goal A1c is below 7.0%  Desired Ranges on GCM Report: Very Low (below 54 mg/dL): <8% Low (below 70 mg/dL): <5% Time in Range (70 - 180 mg/dL): >29% High (819 - 749 mg/dL): <74% Very High (above 250 mg/dL): <4%  Work towards eating three meals a day, about 5-6 hours apart!  Begin to recognize carbohydrates, proteins, and non-starchy vegetables in your food choices!  Begin to build your meals using the proportions of the Balanced Plate. First, select your carb choice(s) for the meal. Make this 25% of your meal. Next, select  your source of protein to pair with your carb choice(s). Make this another 25% of your meal. Finally, complete your meal with a variety of non-starchy vegetables. Make this the remaining 50% of your meal.     Expected Outcomes:  Demonstrated interest in learning. Expect positive outcomes  Education material provided: My Plate, CGM Nutrition Overview  If problems or questions, patient to contact team via:  Phone and Email  Future DSME appointment: 2 months

## 2024-01-04 NOTE — Patient Instructions (Addendum)
 Check your blood sugar each morning before eating or drinking (fasting). Look for numbers between 70-130 mg/dL Check your blood sugar 2 hours after you begin eating a meal. Look for numbers under 180 mg/dL at all times.  Your goal A1c is below 7.0%  Desired Ranges on GCM Report: Very Low (below 54 mg/dL): <8% Low (below 70 mg/dL): <5% Time in Range (70 - 180 mg/dL): >29% High (819 - 749 mg/dL): <74% Very High (above 250 mg/dL): <4%  Work towards eating three meals a day, about 5-6 hours apart!  Begin to recognize carbohydrates, proteins, and non-starchy vegetables in your food choices!  Begin to build your meals using the proportions of the Balanced Plate. First, select your carb choice(s) for the meal. Make this 25% of your meal. Next, select your source of protein to pair with your carb choice(s). Make this another 25% of your meal. Finally, complete your meal with a variety of non-starchy vegetables. Make this the remaining 50% of your meal.

## 2024-03-30 ENCOUNTER — Other Ambulatory Visit: Payer: Self-pay

## 2024-03-30 ENCOUNTER — Emergency Department: Admission: EM | Admit: 2024-03-30 | Discharge: 2024-03-30 | Disposition: A | Discharge status: Home / Self Care

## 2024-03-30 ENCOUNTER — Emergency Department

## 2024-03-30 DIAGNOSIS — Z96652 Presence of left artificial knee joint: Secondary | ICD-10-CM | POA: Insufficient documentation

## 2024-03-30 DIAGNOSIS — E039 Hypothyroidism, unspecified: Secondary | ICD-10-CM | POA: Diagnosis not present

## 2024-03-30 DIAGNOSIS — M5416 Radiculopathy, lumbar region: Secondary | ICD-10-CM | POA: Diagnosis not present

## 2024-03-30 DIAGNOSIS — I10 Essential (primary) hypertension: Secondary | ICD-10-CM | POA: Insufficient documentation

## 2024-03-30 DIAGNOSIS — M545 Low back pain, unspecified: Secondary | ICD-10-CM | POA: Diagnosis present

## 2024-03-30 DIAGNOSIS — J45909 Unspecified asthma, uncomplicated: Secondary | ICD-10-CM | POA: Insufficient documentation

## 2024-03-30 MED ORDER — MORPHINE SULFATE (PF) 4 MG/ML IV SOLN
4.0000 mg | Freq: Once | INTRAVENOUS | Status: AC
  Administered 2024-03-30: 4 mg via INTRAVENOUS
  Filled 2024-03-30: qty 1

## 2024-03-30 MED ORDER — KETOROLAC TROMETHAMINE 15 MG/ML IJ SOLN
15.0000 mg | Freq: Once | INTRAMUSCULAR | Status: AC
  Administered 2024-03-30: 15 mg via INTRAVENOUS
  Filled 2024-03-30: qty 1

## 2024-03-30 MED ORDER — LIDOCAINE 5 % EX PTCH
1.0000 | MEDICATED_PATCH | CUTANEOUS | Status: DC
  Administered 2024-03-30: 1 via TRANSDERMAL
  Filled 2024-03-30: qty 1

## 2024-03-30 MED ORDER — PREDNISONE 10 MG (21) PO TBPK: ORAL_TABLET | ORAL | 0 refills | Status: DC

## 2024-03-30 MED ORDER — DEXAMETHASONE SODIUM PHOSPHATE 10 MG/ML IJ SOLN
10.0000 mg | Freq: Once | INTRAMUSCULAR | Status: AC
  Administered 2024-03-30: 10 mg via INTRAVENOUS
  Filled 2024-03-30: qty 1

## 2024-03-30 MED ORDER — NAPROXEN 500 MG PO TABS: 500.0000 mg | ORAL_TABLET | Freq: Two times a day (BID) | ORAL | 0 refills | Status: AC

## 2024-03-30 NOTE — ED Notes (Signed)
 See triage note  Presents with pain to right leg  States pain started this am  around 4:00  States she did try some pain meds w/o relief  Denies any injury  States pain is from her knee down

## 2024-03-30 NOTE — ED Triage Notes (Signed)
 Pt to ED via [facility] from home for c/o right leg pain that began at 4am this morning. Pt states she took percocet with no relief. Pt states hx of leg pain, herniated disc, and sciatic nerve pain. Pt A&O.

## 2024-03-30 NOTE — Discharge Instructions (Addendum)
 Please take the medications as prescribed.  You may take in addition to your Percocet and gabapentin .  Do not take the naproxen  with other NSAIDs.  Please follow-up with spine surgery.  Please return for any new, worsening, or changing symptoms or other concerns.  It was a pleasure caring for you today

## 2024-03-30 NOTE — ED Provider Notes (Signed)
 [facility] Provider Note    Event Date/Time   First MD Initiated Contact with Patient 03/30/24 [phone]     (approximate)   History   Leg Pain   HPI  [name] is a 68 y.o. female with a past medical history of anxiety, depression, fibromyalgia, GERD, hyperlipidemia, hypertension, lumbosacral spondylosis, lumbar radiculopathy who presents today for evaluation of low back pain with radiation down the back of her right leg.  She reports that she has a disabled son that she has to lift and he is very heavy.  She denies any urinary or fecal incontinence or retention.  No saddle anesthesia.  She reports that she has had this pain for many years, but today the pain feels worse.  She took Percocet prior to arrival.  Patient Active Problem List   Diagnosis Date Noted   S/P TKR (total knee replacement) using cement, left 01/04/2022   PMB (postmenopausal bleeding) 11/01/2019   Mixed incontinence 11/01/2019   Anemia, unspecified 03/21/2017   Anxiety and depression 03/21/2017   Asthma without status asthmaticus 03/21/2017   Benign essential tremor 03/21/2017   Claustrophobia 03/21/2017   Fibromyalgia 03/21/2017   GERD (gastroesophageal reflux disease) 03/21/2017   Heart palpitations 03/21/2017   History of gastric ulcer 03/21/2017   History of motion sickness 03/21/2017   Hyperlipidemia 03/21/2017   Hypertension 03/21/2017   Hypothyroid 03/21/2017   Leukocytosis 03/21/2017   Osteoarthritis 03/21/2017   PONV (postoperative nausea and vomiting) 03/21/2017   Seasonal allergies 03/21/2017   Snores 03/21/2017   SOB (shortness of breath) 01/19/2017   Lumbosacral spondylosis without myelopathy 01/11/2017   Cervical radiculopathy at C8 10/04/2016   Migraine without aura and without status migrainosus, not intractable 10/04/2016   Acute nonintractable headache 07/20/2016   Sepsis (HCC) 07/12/2016   Chronic bilateral low back pain with bilateral sciatica 04/27/2016    Sacroiliitis 04/27/2016   Radiculopathy of lumbar region 04/10/2016   History of fusion of cervical spine 02/23/2016   Radiculopathy of cervical region 02/23/2016   Bilateral thoracic back pain 05/07/2014   Knee joint replacement status 12/06/2013   Cervical stenosis of spinal canal 11/21/2012   Neck pain 10/04/2012          Physical Exam   Triage Vital Signs: ED Triage Vitals  Encounter Vitals Group     BP 03/30/24 0757 (!) 147/72     Girls Systolic BP Percentile --      Girls Diastolic BP Percentile --      Boys Systolic BP Percentile --      Boys Diastolic BP Percentile --      Pulse Rate 03/30/24 0757 87     Resp 03/30/24 0757 18     Temp 03/30/24 0757 97.9 F (36.6 C)     Temp Source 03/30/24 0757 Oral     SpO2 03/30/24 0757 96 %     Weight 03/30/24 0800 240 lb (108.9 kg)     Height 03/30/24 0800 5' 3 (1.6 m)     Head Circumference --      Peak Flow --      Pain Score 03/30/24 0758 10     Pain Loc --      Pain Education --      Exclude from Growth Chart --     Most recent vital signs: Vitals:   03/30/24 0757  BP: (!) 147/72  Pulse: 87  Resp: 18  Temp: 97.9 F (36.6 C)  SpO2: 96%  Physical Exam Vitals and nursing note reviewed.  Constitutional:      General: Awake and alert. No acute distress.    Appearance: Normal appearance.  HENT:     Head: Normocephalic and atraumatic.     Mouth: Mucous membranes are moist.  Eyes:     General: PERRL. Normal EOMs        Right eye: No discharge.        Left eye: No discharge.     Conjunctiva/sclera: Conjunctivae normal.  Cardiovascular:     Rate and Rhythm: Normal rate and regular rhythm.     Pulses: Normal pulses.  Pulmonary:     Effort: Pulmonary effort is normal. No respiratory distress.     Breath sounds: Normal breath sounds.  Abdominal:     Abdomen is soft. There is no abdominal tenderness. No rebound or guarding. No distention. Musculoskeletal:        General: No swelling. Normal range of  motion.     Cervical back: Normal range of motion and neck supple.  Back: No midline tenderness. Strength and sensation 5/5 to bilateral lower extremities. Normal great toe extension against resistance. Normal sensation throughout feet. Normal patellar reflexes.  Positive SLR on the right and negative opposite SLR bilaterally. Negative logroll bilaterally.  Able to lift both legs up off of the stretcher. Skin:    General: Skin is warm and dry.     Capillary Refill: Capillary refill takes less than 2 seconds.     Findings: No rash.  Neurological:     Mental Status: The patient is awake and alert.      ED Results / Procedures / Treatments   Labs (all labs ordered are listed, but only abnormal results are displayed) Labs Reviewed - No data to display   EKG     RADIOLOGY I independently reviewed and interpreted imaging and agree with radiologists findings.     PROCEDURES:  Critical Care performed:   Procedures   MEDICATIONS ORDERED IN ED: Medications  lidocaine  (LIDODERM ) 5 % 1 patch (1 patch Transdermal Patch Applied 03/30/24 0836)  morphine  (PF) 4 MG/ML injection 4 mg (4 mg Intravenous Given 03/30/24 0836)  ketorolac  (TORADOL ) 15 MG/ML injection 15 mg (15 mg Intravenous Given 03/30/24 0835)  dexamethasone (DECADRON) injection 10 mg (10 mg Intravenous Given 03/30/24 1000)  morphine  (PF) 4 MG/ML injection 4 mg (4 mg Intravenous Given 03/30/24 1048)    03/27/2024 03/27/2024  1 Oxycodone-Acetaminophen  5-325 30.00 5 Ja Hed [id number] Thr (4878) 0/0 45.00 MME Medicare [place]  01/26/2024 12/28/2023  1 Gabapentin  300 Mg Capsule 90.00 30 Ju Bre [id number] Thr (4878) 1/1  Medicare [place]  12/28/2023 12/28/2023  1 Gabapentin  300 Mg Capsule 90.00 30 Ju Bre [id number] Thr (4878) 0/1  Medicare [place]  06/16/2023 06/16/2023  1 Diazepam 5 Mg Tablet 30.00 30 Ja Hed [id number] Thr (4878) 0/1 0.50 LME Medicare [place]  10/27/2022 06/09/2022  1 Diazepam 5 Mg Tablet 30.00 30 Ja Hed [id number] Thr (4878) 0/0 0.50 LME Medicare  [place]  10/27/2022 05/27/2022  1 Tramadol Hcl 50 Mg Tablet 19.00 6 Ju Bre [id number] Thr (4878) 1/0 31.67 MME Medicare [place]  10/11/2022 05/27/2022  1 Tramadol Hcl 50 Mg Tablet 21.00 7 Ju Bre [id number] Thr (4878) 0/0 30.00 MME Medicare [place]  06/24/2022 05/27/2022  1 Tramadol Hcl 50 Mg Tablet 40.00 13 Ju Bre [id number] War (0194) 0/1 30.77 MME Medicare [place]  06/09/2022 06/09/2022  1 Diazepam 5 Mg Tablet 30.00 30 Ja Hed [id number] War (0194) 0/1 0.50 LME  Medicare [place]  05/18/2022 04/14/2022  1 Tramadol Hcl 50 Mg Tablet 40.00 10 Ju Bre [id number] [place] (0194) 0/0 40.00 MME Medicare [place]     IMPRESSION / MDM / ASSESSMENT AND PLAN / ED COURSE  I reviewed the triage vital signs and the nursing notes.   Differential diagnosis includes, but is not limited to, lumbar radiculopathy, herniated disc, muscle strain, muscle spasm.  I reviewed the patient's chart.  Patient saw [facility] on 03/28/2024 with the same complaint.  She is on an opiate agreement that was signed on 08/26/2022.  She has also had interventional treatments including ESI's at L4-L5.  She is on gabapentin , Celebrex, and Tylenol  for continued pain.  She has had MRIs of her lumbar spine in the past that reviews desiccation and degenerative change worst at L2-L3.  I reviewed the PDMP.  Patient was just prescribed Percocet on 03/27/2024.  She is also on benzodiazepines.  Patient has 5 out of 5 strength with intact sensation to extensor hallucis dorsiflexion and plantarflexion of bilateral lower extremities with normal patellar reflexes bilaterally. Most likely etiology at this point is muscle strain vs herniated disc.  No urinary or fecal incontinence or retention or saddle anesthesia to suggest cord compression or cauda equina.  No focal neurological deficits on exam. No constitutional symptoms or history of immunosuppression or IVDA to suggest potential for epidural abscess. Not anticoagulated, no history of bleeding diastasis to suggest risk for epidural hematoma. No  chronic steroid use or advanced age or history of malignancy to suggest proclivity towards pathological fracture.  No abdominal pain or flank pain to suggest kidney stone, no history of kidney stone.  No fever or dysuria or CVAT to suggest pyelonephritis.  No chest pain, back pain, shortness of breath, neurological deficits, to suggest vascular catastrophe, and pulses are equal in all 4 extremities.    CT reveals L4-L5 broad-based disc bulge eccentric to the right with moderate to severe central canal stenosis and right lateral recess stenosis likely impinging the right L5 nerve.  There is also disc bulge at L2-L3, L3-L4, and T11-T12.  Patient was treated with Toradol  and morphine  with good effect.  However, she then reported that her pain returned so she was given a dose of dexamethasone while awaiting CT results.  This also provide a good effect.  She did request 1 other dose of morphine  which was provided for her.  Given her multiple rounds of narcotics and pain control, I offered admission to the hospital, however patient prefers to be discharged home.  She reports that she is only taking Percocet and gabapentin  for her pain at home, therefore she was given a prescription for naproxen  and prednisone.  She was instructed to follow-up with spine surgery and the appropriate follow-up information was provided.  Discussed care instructions and return precautions with patient. Recommended close outpatient follow-up for re-evaluation. Patient agrees with plan of care.  Discussed strict return precautions including development of any neurologic symptoms. Educated patient regarding expected time course for back pain to improve and recommended very close outpatient follow-up.  Patient understands and agrees with plan.  She was discharged in stable condition with her friend/family number.  She is able to ambulate.   Patient's presentation is most consistent with severe exacerbation of chronic illness.   Clinical  Course as of 03/30/24 1356  Fri Mar 30, 2024  1034 Patient reports that she feels improved but would like 1 more round of morphine  prior to discharge. [JP]    Clinical Course  User Index [JP] Parul Porcelli E, PA-C     FINAL CLINICAL IMPRESSION(S) / ED DIAGNOSES   Final diagnoses:  Lumbar radiculopathy     Rx / DC Orders   ED Discharge Orders          Ordered    naproxen  (NAPROSYN ) 500 MG tablet  2 times daily with meals        03/30/24 1119    predniSONE (STERAPRED UNI-PAK 21 TAB) 10 MG (21) TBPK tablet        03/30/24 1119             Note:  This document was prepared using Dragon voice recognition software and may include unintentional dictation errors.   Bryna Razavi E, PA-C 03/30/24 1356    Clarine Ozell LABOR, MD 03/31/24 1059

## 2024-03-30 NOTE — ED Notes (Signed)
 Attempted to sit pt up  States pain is getting worse  Provider aware

## 2024-04-02 ENCOUNTER — Encounter: Payer: Self-pay | Admitting: Orthopedic Surgery

## 2024-04-02 ENCOUNTER — Telehealth: Payer: Self-pay | Admitting: Neurosurgery

## 2024-04-02 NOTE — Telephone Encounter (Signed)
 Patient was seen in the ER on 10/03 and is currently experiencing severe pain in the right leg, with inability to walk. Both legs are swollen, and imaging indicates a disc pressing on the nerve, contributing to significant lower back pain radiating down the left side. Pain medications have been ineffective, and the patient was informed that no treatment options would provide relief at this time. ER staff recommended that the patient follow up with Dr. CINDERELLA, and she is wondering if there are any available appointment times to be squeezed in or what her options might be.

## 2024-04-02 NOTE — Telephone Encounter (Signed)
 She can be seen with a PA at the earliest available opening. Thanks!

## 2024-04-02 NOTE — Telephone Encounter (Signed)
 Patient scheduled for 04/03/2024

## 2024-04-02 NOTE — Progress Notes (Unsigned)
 Referring Physician:  Stanton Lynwood FALCON, MD [address] [place],  [place] [address]  Primary Physician:  Stanton Lynwood FALCON, MD  History of Present Illness: 04/03/2024 [name] has a history of HTN, migraines, asthma, GERD, hypothyroidism, benign essential tremor, anxiety, depression, FM, gastric ulcer, hyperlipidemia.   History of ACDF C4-C7 in 2014.   Seen in ED on 03/30/24 for flare up of chronic LBP and right leg pain.   She has constant right sided LBP with constant right leg pain (entire leg) to her foot x 6 days. No left leg pain. Pain is severe with standing, walking. She has numbness, tingling, weakness in right leg. No alleviating factors. No known injury. She has swelling in both legs.   She is taking neurontin  and percocet. She finishes the prednisone tomorrow.   She has been seeing [facility]. Did not have much relief with last injection in July.   Tobacco use: Does not smoke.   Bowel/Bladder Dysfunction: urinary urgency, no  bowel issues.   Conservative measures:  Physical therapy: No recent PT Multimodal medical therapy including regular antiinflammatories:  Celebrex, Gabapentin , Naproxen , Methocarbamol , Prednisone Injections:  01/11/2024 L4-L5 ESI 10/2023 L5-S1 ESI 01/2023 L5-S1 ESI 03/24/2022 Left L5-S1 ESI   Past Surgery:  11/29/2012 C4-C7 ACDF  [name] has no symptoms of cervical myelopathy.  The symptoms are causing a significant impact on the patient's life.   Review of Systems:  A 10 point review of systems is negative, except for the pertinent positives and negatives detailed in the HPI.  Past Medical History: Past Medical History:  Diagnosis Date   Anemia    Anxiety    Arthritis    Asthma    Benign essential tremor    CAD (coronary artery disease)    a.) MV 01/01/2019: EF 47%; reversible ant ischemia; b.) cCTA 01/18/2019: Ca score 200; <25& p-m LAD,diag, LCx, oRCA, p-dRCA; FFR CT analysis: LAD 0.87, LCx 0.95, RCA  0.91.   Claustrophobia    Complication of anesthesia    a.) delayed emergence; b.) PONV   Depression    Diastolic dysfunction 02/16/2017   a.) TTE 02/16/2017: EF >55%, triv MR/PR, mild TR, G1DD; b.) TTE 02/13/2019: EF 45%, mild LVH, septal and apical HK, triv MR, mild TR.   Dyspnea    Fibromyalgia    Fusion of spine of cervical region    Gastric ulcer    GERD (gastroesophageal reflux disease)    Hemorrhoids    Hepatic steatosis    History of chicken pox    History of motion sickness    History of pleurisy    History of seasonal allergies    Hyperlipidemia    Hypertension    Leukocytosis    Migraines    Palpitations    PONV (postoperative nausea and vomiting)    Radiculopathy of cervical region    Snores    a.) has had negative PSG testing   Type 2 diabetes mellitus treated with insulin  [facility])     Past Surgical History: Past Surgical History:  Procedure Laterality Date   ACDF C4-C7 WITH ALLOGRAFT INSERTION N/A 11/29/2012   ACHILLES TENDON LENGTHENING Bilateral 2010   ARTHRODESIS ANTERIOR CERVICAL SPINE N/A 12/04/2012   BREAST BIOPSY Bilateral 1970's   BREAST BIOPSY Left    stereotactic   CARPAL TUNNEL RELEASE Bilateral    CESAREAN SECTION     COLONOSCOPY WITH PROPOFOL  N/A 04/14/2016   Procedure: COLONOSCOPY WITH PROPOFOL ;  Surgeon: Lamar ONEIDA Holmes,  MD;  Location: [facility] ENDOSCOPY;  Service: Endoscopy;  Laterality: N/A;   COLONOSCOPY WITH PROPOFOL  N/A 02/15/2020   Procedure: COLONOSCOPY WITH PROPOFOL ;  Surgeon: Maryruth Ole DASEN, MD;  Location: [facility] ENDOSCOPY;  Service: Endoscopy;  Laterality: N/A;   ESOPHAGOGASTRODUODENOSCOPY (EGD) WITH PROPOFOL  N/A 04/14/2016   Procedure: ESOPHAGOGASTRODUODENOSCOPY (EGD) WITH PROPOFOL ;  Surgeon: Lamar DASEN Holmes, MD;  Location: [facility] ENDOSCOPY;  Service: Endoscopy;  Laterality: N/A;   ESOPHAGOGASTRODUODENOSCOPY (EGD) WITH PROPOFOL  N/A 02/15/2020   Procedure: ESOPHAGOGASTRODUODENOSCOPY (EGD) WITH PROPOFOL ;  Surgeon: Maryruth Ole DASEN,  MD;  Location: [facility] ENDOSCOPY;  Service: Endoscopy;  Laterality: N/A;   FLEXIBLE SIGMOIDOSCOPY N/A 11/03/2016   Procedure: FLEXIBLE SIGMOIDOSCOPY;  Surgeon: Holmes Lamar DASEN, MD;  Location: [facility] ENDOSCOPY;  Service: Endoscopy;  Laterality: N/A;   FLEXIBLE SIGMOIDOSCOPY N/A 01/31/2017   Procedure: FLEXIBLE SIGMOIDOSCOPY;  Surgeon: Holmes Lamar DASEN, MD;  Location: [facility] ENDOSCOPY;  Service: Endoscopy;  Laterality: N/A;   gastric ulcer     NERVE SURGERY     on back   plantar fasciitis Left    SHOULDER ARTHROSCOPY Right    TONSILLECTOMY     TOTAL KNEE ARTHROPLASTY Right 11/27/2013   TOTAL KNEE ARTHROPLASTY Left 01/04/2022   Procedure: TOTAL KNEE ARTHROPLASTY;  Surgeon: Leora Lynwood SAUNDERS, MD;  Location: [facility] ORS;  Service: Orthopedics;  Laterality: Left;    Allergies: Allergies as of 04/03/2024 - Review Complete 04/03/2024  Allergen Reaction Noted   Cat dander Other (See Comments) and Itching 02/28/2013   Oxycodone Other (See Comments) 09/30/2015   Penicillin g Itching 04/03/2024   Statins Other (See Comments) 02/13/2020   Zosyn  [piperacillin  sod-tazobactam so] Swelling 11/03/2016    Medications: Outpatient Encounter Medications as of 04/03/2024  Medication Sig   cyanocobalamin  (VITAMIN B12) 1000 MCG/ML injection Inject 1,000 mcg into the muscle.   oxyCODONE-acetaminophen  (PERCOCET/ROXICET) 5-325 MG tablet Take by mouth.   Tuberculin-Allergy Syringes (VANISHPOINT TUBERCULIN SYRINGE) 25G X 1 1 ML MISC See admin instructions.   amLODipine  (NORVASC ) 10 MG tablet Take by mouth.   aspirin  81 MG chewable tablet Chew 1 tablet (81 mg total) by mouth 2 (two) times daily.   buPROPion (WELLBUTRIN XL) 150 MG 24 hr tablet Take 150 mg by mouth daily.   carbidopa-levodopa (SINEMET IR) 25-100 MG tablet Take by mouth.   celecoxib (CELEBREX) 200 MG capsule Take 200 mg by mouth.   citalopram  (CELEXA ) 10 MG tablet Take 1 tablet by mouth daily.   docusate sodium  (COLACE) 100 MG capsule Take 1 capsule (100  mg total) by mouth 2 (two) times daily.   ezetimibe  (ZETIA ) 10 MG tablet Take 10 mg by mouth daily.   famotidine  (PEPCID ) 20 MG tablet Take 1 tablet (20 mg total) by mouth 2 (two) times daily.   gabapentin  (NEURONTIN ) 300 MG capsule Take 300 mg by mouth as needed.   glimepiride  (AMARYL ) 1 MG tablet Take 1 mg by mouth daily with breakfast. Take 1/2 in the morning and 1/2 in the evening (Patient not taking: Reported on 01/04/2024)   insulin  degludec (TRESIBA) 100 UNIT/ML FlexTouch Pen Inject 25 Units into the skin.   insulin  glargine (LANTUS ) 100 UNIT/ML Solostar Pen Inject 10 Units into the skin daily.   methocarbamol  (ROBAXIN -750) 750 MG tablet Take 1 tablet (750 mg total) by mouth every 8 (eight) hours as needed for muscle spasms.   metoCLOPramide  (REGLAN ) 10 MG tablet Take 1 tablet (10 mg total) by mouth every 6 (six) hours as needed.   naproxen  (NAPROSYN ) 500 MG tablet Take 1 tablet (500 mg  total) by mouth 2 (two) times daily with a meal.   naproxen  (NAPROSYN ) 500 MG tablet Take 1 tablet (500 mg total) by mouth 2 (two) times daily with a meal for 7 days.   olmesartan (BENICAR) 40 MG tablet Take 1 tablet by mouth 2 (two) times daily.   pantoprazole  (PROTONIX ) 40 MG tablet Take 40 mg by mouth daily.    pioglitazone (ACTOS) 30 MG tablet Take 30 mg by mouth daily.   predniSONE (STERAPRED UNI-PAK 21 TAB) 10 MG (21) TBPK tablet You may take 6 tablets on day 1 and decrease by 1 tablet for a subsequent day   spironolactone (ALDACTONE) 25 MG tablet Take 25 mg by mouth in the morning.   No facility-administered encounter medications on file as of 04/03/2024.    Social History: Social History   Tobacco Use   Smoking status: Former    Current packs/day: 0.00    Average packs/day: 2.0 packs/day for 35.0 years (70.0 ttl pk-yrs)    Types: Cigarettes    Start date: 07/01/1965    Quit date: 07/01/2000    Years since quitting: 23.7   Smokeless tobacco: Never  Vaping Use   Vaping status: Never Used   Substance Use Topics   Alcohol use: No   Drug use: No    Family Medical History: Family History  Problem Relation Age of Onset   Breast cancer Mother 70   Hypertension Father    Cancer Sister        Hodkin's lymphoma    Physical Examination: Vitals:   04/03/24 1412  BP: (!) 148/72    General: Patient is well developed, well nourished, calm, collected, and in no apparent distress. Attention to examination is appropriate.  Respiratory: Patient is breathing without any difficulty.   NEUROLOGICAL:     Awake, alert, oriented to person, place, and time.  Speech is clear and fluent. Fund of knowledge is appropriate.   Cranial Nerves: Pupils equal round and reactive to light.  Facial tone is symmetric.    Diffuse lower posterior lumbar tenderness.   No abnormal lesions on exposed skin.   Strength: Side Biceps Triceps Deltoid Interossei Grip Wrist Ext. Wrist Flex.  R 5 5 5 5 5 5 5   L 5 5 5 5 5 5 5    Side Iliopsoas Quads Hamstring PF DF EHL  R 5 5 5 4  4- 4-  L 5 5 5 5 5 5    4-/5 strength right foot inversion, 5/5 left foot inversion  Reflexes are 2+ and symmetric at the biceps, brachioradialis, patella and achilles.   Hoffman's is absent.  Clonus is not present.   Bilateral upper and lower extremity sensation is intact to light touch.     No pain with IR/ER of both hips.   Gait not tested, she is in a WC.    Medical Decision Making  Imaging: Lumbar CT scan dated 03/30/24:   FINDINGS:   BONES AND ALIGNMENT: There is straightening of the normal lumbar lordosis. Normal vertebral body heights. No acute fracture or suspicious bone lesion.   DEGENERATIVE CHANGES: T11-T12: There is a left paracentral posterior disc osteophyte causing mild-to-moderate central spinal canal stenosis. L2-L3: There is broad-based disc bulging and mild facet hypertrophy, causing mild-to-moderate central spinal canal stenosis and bilateral lateral recess stenosis. L3-L4: There is  diffuse disc bulging and bilateral facet arthrosis with moderate central spinal canal stenosis and mild-to-moderate bilateral lateral recess stenosis. L4-L5: There is broad-based disc bulging, which is eccentric to the right,  resulting in moderate to severe central spinal canal stenosis and right lateral recess stenosis. There is likely impingement of the right L5 nerve in the lateral recess. L5-S1: There is mild disc bulging and bilateral facet hypertrophy. There is mild-to-moderate bilateral neural foraminal stenosis.   SOFT TISSUES: Abdominal aorta is normal in caliber and demonstrates moderate calcific atheromatous disease.   IMPRESSION: 1. L4-5 broad-based disc bulge eccentric to the right with moderate to severe central canal stenosis and right lateral recess stenosis, likely impinging the right L5 nerve. 2. L3-4 diffuse disc bulge and bilateral facet arthrosis with moderate central canal stenosis and mild-to-moderate bilateral lateral recess stenosis. 3. L2-3 broad-based disc bulge and mild facet hypertrophy with mild-to-moderate central canal stenosis and bilateral lateral recess stenosis. 4. T11-12 left paracentral posterior disc osteophyte with mild-to-moderate central canal stenosis.   Electronically signed by: Evalene Coho MD 03/30/2024 09:45 AM EDT RP Workstation: [id number]  I have personally reviewed the images and agree with the above interpretation.  Assessment and Plan: Ms. Eckstein was seen in ED on 03/30/24 for flare up of chronic LBP and right leg pain.   She has 6 day history of constant right sided LBP with constant right leg pain (entire leg) to her foot. No left leg pain. She has numbness, tingling, weakness in right leg. Having difficulty standing and walking due to pain.   CT from ED shows right sided disc L4-L5 with moderate/severe central stenosis and likely impingement of right L5 nerve. Also with moderate central and bilateral foraminal stenosis  L2-L4.   She has 4-/5 strength in right DF/EHL and inversion of right foot.   Treatment options discussed with patient and following plan made:   - MRI of lumbar spine to further evaluate her right foot weakness.  - Not having much relief with percocet from PCP. Will stop this.  - New prescription for norco 7.5 to use prn severe pain. Reviewed dosing and side effects. PMP reviewed and is appropriate. Do not take any extra OTC tylenol .  - Continue on neurontin  from other providers. Finish out prednisone from ED.  - Follow up with PCP regarding lower extremity swelling. Not likely spine mediated.  - She will let me know when MRI is done so I can get results. Will regroup after results and likely have her see one of the surgeons (she requests Dr. Clois).  - Red flag symptoms discussed- she will go to ED if pain intolerable or if she is unable to get around at home.   I spent a total of 45 minutes in face-to-face and non-face-to-face activities related to this patient's care today including review of outside records, review of imaging, review of symptoms, physical exam, discussion of differential diagnosis, discussion of treatment options, and documentation.   Thank you for involving me in the care of this patient.   Glade Boys PA-C Dept. of Neurosurgery

## 2024-04-03 ENCOUNTER — Encounter: Payer: Self-pay | Admitting: Orthopedic Surgery

## 2024-04-03 ENCOUNTER — Ambulatory Visit: Admitting: Orthopedic Surgery

## 2024-04-03 VITALS — BP 148/72 | Ht 63.0 in | Wt 243.0 lb

## 2024-04-03 DIAGNOSIS — M4726 Other spondylosis with radiculopathy, lumbar region: Secondary | ICD-10-CM

## 2024-04-03 DIAGNOSIS — M48061 Spinal stenosis, lumbar region without neurogenic claudication: Secondary | ICD-10-CM

## 2024-04-03 DIAGNOSIS — R29898 Other symptoms and signs involving the musculoskeletal system: Secondary | ICD-10-CM

## 2024-04-03 DIAGNOSIS — M5416 Radiculopathy, lumbar region: Secondary | ICD-10-CM

## 2024-04-03 DIAGNOSIS — M47816 Spondylosis without myelopathy or radiculopathy, lumbar region: Secondary | ICD-10-CM

## 2024-04-03 MED ORDER — HYDROCODONE-ACETAMINOPHEN 7.5-325 MG PO TABS: 1.0000 | ORAL_TABLET | ORAL | 0 refills | Status: DC | PRN

## 2024-04-03 NOTE — Patient Instructions (Signed)
 It was so nice to see you today. Thank you so much for coming in.    You have some weakness in your right foot. I want to get an MRI of your back to look into this further. We will get this approved through your insurance and [facility] will call you to schedule the appointment. Ask about your patient responsibility. You do not need to pay this prior to getting MRI, they can bill you.   [facility] (building with the white pillars) is located off of [address]. The address is [address] [place], [place] [address].    After you have the MRI, let me know so I can get the results quickly.   Stop the percocet.   I sent a prescription for hydrocodone  to help with severe pain. Take only as needed. This can make you sleepy and constipated.   Do not take any extra tylenol  with the hydrocodone .   Continue on the gabapentin  and finish out the steroids.   Please do not hesitate to call if you have any questions or concerns. You can also message me in MyChart.   Glade Boys PA-C [phone]     The physicians and staff at [facility] at [facility] are committed to providing excellent care. You may receive a survey asking for feedback about your experience at our office. We value you your feedback and appreciate you taking the time to to fill it out. The [facility] leadership team is also available to discuss your experience in person, feel free to contact us  [phone].

## 2024-04-04 ENCOUNTER — Other Ambulatory Visit: Payer: Self-pay | Admitting: Orthopedic Surgery

## 2024-04-04 ENCOUNTER — Ambulatory Visit
Admission: RE | Admit: 2024-04-04 | Discharge: 2024-04-04 | Disposition: A | Discharge status: Home / Self Care | Source: Ambulatory Visit | Attending: Orthopedic Surgery | Admitting: Orthopedic Surgery

## 2024-04-04 DIAGNOSIS — M47816 Spondylosis without myelopathy or radiculopathy, lumbar region: Secondary | ICD-10-CM

## 2024-04-04 DIAGNOSIS — R29898 Other symptoms and signs involving the musculoskeletal system: Secondary | ICD-10-CM | POA: Diagnosis present

## 2024-04-04 DIAGNOSIS — M48061 Spinal stenosis, lumbar region without neurogenic claudication: Secondary | ICD-10-CM

## 2024-04-04 DIAGNOSIS — M5416 Radiculopathy, lumbar region: Secondary | ICD-10-CM | POA: Diagnosis present

## 2024-04-05 ENCOUNTER — Encounter: Payer: Self-pay | Admitting: Orthopedic Surgery

## 2024-04-05 ENCOUNTER — Telehealth: Payer: Self-pay | Admitting: Orthopedic Surgery

## 2024-04-05 NOTE — Telephone Encounter (Signed)
 Please let her know that I reviewed her MRI and sent her a MyChart message (not sure she checks MyChart regularly).   If she can't see the message, you can schedule her a phone visit with me tomorrow.   I scheduled her at the next available to see Dr. Clois to discuss further options.

## 2024-04-05 NOTE — Telephone Encounter (Signed)
 Lumbar MRI dated 04/04/24:  FINDINGS:   BONES AND ALIGNMENT: 5 lumbar vertebrae. Straightening of the normal lumbar lordosis. No significant listhesis, fracture, or suspicious marrow lesion. Multilevel, predominantly chronic degenerative endplate changes. Minimal degenerative endplate edema at O5-O4.   SPINAL CORD: The conus terminates at L1 and is normal in signal.   SOFT TISSUES: Partially visualized chronic hepatomegaly.   DISC LEVELS: Disc desiccation from L2-L3 through L5-S1 with up to moderate disc space narrowing at L2-L3, L4-L5, and L5-S1.   T11-T12: Only imaged sagittally. Chronically calcified central disc extrusion, similar in size to the prior MRI and without evidence of significant associated stenosis.   T12-L1: Minimal disc bulging without stenosis.   L1-L2: Negative.   L2-L3: Disc bulging and mild facet hypertrophy without significant stenosis, unchanged from the prior MRI.   L3-L4: Circumferential disc bulging mildly eccentric to the left, prominent epidural fat, and moderate facet hypertrophy result in mild spinal stenosis and mild bilateral lateral recess stenosis without significant neural foraminal stenosis, unchanged from the prior MRI.   L4-L5: Circumferential disc bulging, a right paracentral to subarticular disc protrusion, prominent epidural fat, and moderate facet and ligamentum flavum hypertrophy result in moderate to severe spinal stenosis, severe right and mild left lateral recess stenosis, and moderate neural foraminal stenosis. Findings have progressed from the prior MRI with the disc protrusion being new and likely resulting in right L5 nerve root impingement.   L5-S1: Disc bulging, left larger than right foraminal disc osteophyte complexes, and mild facet hypertrophy result in mild right and moderate left neural foraminal stenosis, similar to the prior MRI. No spinal stenosis.   IMPRESSION: 1. Progressive disc degeneration at L4-5  since 2023, including a new right-sided disc protrusion contributing to moderate to severe spinal stenosis and severe right lateral recess stenosis with right L5 nerve root impingement. 2. Unchanged mild spinal stenosis at L3-4 and moderate left foraminal stenosis at L5-S1.   Electronically signed by: Dasie Hamburg MD 04/04/2024 06:12 PM EDT RP Workstation: [id number]    I have personally reviewed the images and agree with the above interpretation.  Next available f/u scheduled with Dr. Clois due to right foot weakness. Message sent to patient.

## 2024-04-05 NOTE — Telephone Encounter (Signed)
 I spoke to patient and she will look at her Mychart. Patient is aware of the upcoming appointment.

## 2024-04-09 NOTE — H&P (View-Only) (Signed)
 Referring Physician:  Valora Lynwood FALCON, MD [address] [place],  [place] [address]  Primary Physician:  Valora Lynwood FALCON, MD  History of Present Illness: 04/17/2024 [name] is here today with a chief complaint of severe right buttock and leg pain.  She has numbness and tingling.  She has weakness in the right foot.  She is not able to walk normally.  She normally walks without assistance, she is now using a wheelchair.  She was evaluated in clinic and found to have weakness.  She is here today to discuss options.  The symptoms are causing a significant impact on the patient's life.   I have utilized the care everywhere function in epic to review the outside records available from external health systems.  Progress Note from Glade Boys, [place] on 04/03/24:  History of Present Illness: 04/03/2024 [name] has a history of HTN, migraines, asthma, GERD, hypothyroidism, benign essential tremor, anxiety, depression, FM, gastric ulcer, hyperlipidemia.    History of ACDF C4-C7 in 2014.    Seen in ED on 03/30/24 for flare up of chronic LBP and right leg pain.    She has constant right sided LBP with constant right leg pain (entire leg) to her foot x 6 days. No left leg pain. Pain is severe with standing, walking. She has numbness, tingling, weakness in right leg. No alleviating factors. No known injury. She has swelling in both legs.    She is taking neurontin  and percocet. She finishes the prednisone tomorrow.    She has been seeing [facility]. Did not have much relief with last injection in July.    Tobacco use: Does not smoke.    Bowel/Bladder Dysfunction: urinary urgency, no  bowel issues.    Conservative measures:  Physical therapy: No recent PT Multimodal medical therapy including regular antiinflammatories:  Celebrex, Gabapentin , Naproxen , Methocarbamol , Prednisone Injections:  01/11/2024 L4-L5 ESI 10/2023 L5-S1 ESI 01/2023 L5-S1 ESI 03/24/2022 Left  L5-S1 ESI     Past Surgery:  11/29/2012 C4-C7 ACDF  Review of Systems:  A 10 point review of systems is negative, except for the pertinent positives and negatives detailed in the HPI.  Past Medical History: Past Medical History:  Diagnosis Date   Anemia    Anxiety    Arthritis    Asthma    Benign essential tremor    CAD (coronary artery disease)    a.) MV 01/01/2019: EF 47%; reversible ant ischemia; b.) cCTA 01/18/2019: Ca score 200; <25& p-m LAD,diag, LCx, oRCA, p-dRCA; FFR CT analysis: LAD 0.87, LCx 0.95, RCA 0.91.   Claustrophobia    Complication of anesthesia    a.) delayed emergence; b.) PONV   Depression    Diastolic dysfunction 02/16/2017   a.) TTE 02/16/2017: EF >55%, triv MR/PR, mild TR, G1DD; b.) TTE 02/13/2019: EF 45%, mild LVH, septal and apical HK, triv MR, mild TR.   Dyspnea    Fibromyalgia    Fusion of spine of cervical region    Gastric ulcer    GERD (gastroesophageal reflux disease)    Hemorrhoids    Hepatic steatosis    History of chicken pox    History of motion sickness    History of pleurisy    History of seasonal allergies    Hyperlipidemia    Hypertension    Leukocytosis    Migraines    Palpitations    PONV (postoperative nausea and vomiting)    Radiculopathy of cervical region  Snores    a.) has had negative PSG testing   Type 2 diabetes mellitus treated with insulin  [facility])     Past Surgical History: Past Surgical History:  Procedure Laterality Date   ACDF C4-C7 WITH ALLOGRAFT INSERTION N/A 11/29/2012   ACHILLES TENDON LENGTHENING Bilateral 2010   ARTHRODESIS ANTERIOR CERVICAL SPINE N/A 12/04/2012   BREAST BIOPSY Bilateral 1970's   BREAST BIOPSY Left    stereotactic   CARPAL TUNNEL RELEASE Bilateral    CESAREAN SECTION     COLONOSCOPY WITH PROPOFOL  N/A 04/14/2016   Procedure: COLONOSCOPY WITH PROPOFOL ;  Surgeon: Lamar ONEIDA Holmes, MD;  Location: [facility] ENDOSCOPY;  Service: Endoscopy;  Laterality: N/A;   COLONOSCOPY WITH PROPOFOL  N/A  02/15/2020   Procedure: COLONOSCOPY WITH PROPOFOL ;  Surgeon: Maryruth Ole ONEIDA, MD;  Location: [facility] ENDOSCOPY;  Service: Endoscopy;  Laterality: N/A;   ESOPHAGOGASTRODUODENOSCOPY (EGD) WITH PROPOFOL  N/A 04/14/2016   Procedure: ESOPHAGOGASTRODUODENOSCOPY (EGD) WITH PROPOFOL ;  Surgeon: Lamar ONEIDA Holmes, MD;  Location: [facility] ENDOSCOPY;  Service: Endoscopy;  Laterality: N/A;   ESOPHAGOGASTRODUODENOSCOPY (EGD) WITH PROPOFOL  N/A 02/15/2020   Procedure: ESOPHAGOGASTRODUODENOSCOPY (EGD) WITH PROPOFOL ;  Surgeon: Maryruth Ole ONEIDA, MD;  Location: [facility] ENDOSCOPY;  Service: Endoscopy;  Laterality: N/A;   FLEXIBLE SIGMOIDOSCOPY N/A 11/03/2016   Procedure: FLEXIBLE SIGMOIDOSCOPY;  Surgeon: Holmes Lamar ONEIDA, MD;  Location: [facility] ENDOSCOPY;  Service: Endoscopy;  Laterality: N/A;   FLEXIBLE SIGMOIDOSCOPY N/A 01/31/2017   Procedure: FLEXIBLE SIGMOIDOSCOPY;  Surgeon: Holmes Lamar ONEIDA, MD;  Location: [facility] ENDOSCOPY;  Service: Endoscopy;  Laterality: N/A;   gastric ulcer     NERVE SURGERY     on back   plantar fasciitis Left    SHOULDER ARTHROSCOPY Right    TONSILLECTOMY     TOTAL [name] ARTHROPLASTY Right 11/27/2013   TOTAL [name] ARTHROPLASTY Left 01/04/2022   Procedure: TOTAL [name] ARTHROPLASTY;  Surgeon: Leora Lynwood SAUNDERS, MD;  Location: [facility] ORS;  Service: Orthopedics;  Laterality: Left;    Allergies: Allergies as of 04/17/2024 - Review Complete 04/10/2024  Allergen Reaction Noted   Cat dander Other (See Comments) and Itching 02/28/2013   Oxycodone Other (See Comments) 09/30/2015   Penicillin g Itching 04/03/2024   Statins Other (See Comments) 02/13/2020   Zosyn  [piperacillin  sod-tazobactam so] Swelling 11/03/2016    Medications:  Current Outpatient Medications:    amLODipine  (NORVASC ) 10 MG tablet, Take by mouth., Disp: , Rfl:    buPROPion (WELLBUTRIN XL) 150 MG 24 hr tablet, Take 150 mg by mouth daily., Disp: , Rfl:    carbidopa-levodopa (SINEMET IR) 25-100 MG tablet, Take by mouth., Disp: , Rfl:     citalopram  (CELEXA ) 10 MG tablet, Take 1 tablet by mouth daily., Disp: , Rfl:    cyanocobalamin  (VITAMIN B12) 1000 MCG/ML injection, Inject 1,000 mcg into the muscle., Disp: , Rfl:    gabapentin  (NEURONTIN ) 300 MG capsule, Take 300 mg by mouth as needed., Disp: , Rfl:    HYDROcodone -acetaminophen  (NORCO) 10-325 MG tablet, Take 1 tablet by mouth every 4 (four) hours as needed., Disp: 30 tablet, Rfl: 0   insulin  degludec (TRESIBA) 100 UNIT/ML FlexTouch Pen, Inject 25 Units into the skin., Disp: , Rfl:    olmesartan (BENICAR) 40 MG tablet, Take 1 tablet by mouth 2 (two) times daily., Disp: , Rfl:    pantoprazole  (PROTONIX ) 40 MG tablet, Take 40 mg by mouth daily. , Disp: , Rfl:    pioglitazone (ACTOS) 30 MG tablet, Take 30 mg by mouth daily., Disp: , Rfl:    spironolactone (ALDACTONE) 25 MG tablet,  Take 25 mg by mouth in the morning., Disp: , Rfl:    Tuberculin-Allergy Syringes (VANISHPOINT TUBERCULIN SYRINGE) 25G X 1 1 ML MISC, See admin instructions., Disp: , Rfl:   Social History: Social History   Tobacco Use   Smoking status: Former    Current packs/day: 0.00    Average packs/day: 2.0 packs/day for 35.0 years (70.0 ttl pk-yrs)    Types: Cigarettes    Start date: 07/01/1965    Quit date: 07/01/2000    Years since quitting: 23.8   Smokeless tobacco: Never  Vaping Use   Vaping status: Never Used  Substance Use Topics   Alcohol use: No   Drug use: No    Family Medical History: Family History  Problem Relation Age of Onset   Breast cancer Mother 63   Hypertension Father    Cancer Sister        Hodkin's lymphoma    Physical Examination: Vitals:   04/17/24 1616  BP: (!) 152/76    General: Patient is in no apparent distress. Attention to examination is appropriate.  Neck:   Supple.  Full range of motion.  Respiratory: Patient is breathing without any difficulty.   NEUROLOGICAL:     Awake, alert, oriented to person, place, and time.  Speech is clear and fluent.    Cranial Nerves: Pupils equal round and reactive to light.  Facial tone is symmetric.  Facial sensation is symmetric. Shoulder shrug is symmetric. Tongue protrusion is midline.  There is no pronator drift.  Strength: Side Biceps Triceps Deltoid Interossei Grip Wrist Ext. Wrist Flex.  R 5 5 5 5 5 5 5   L 5 5 5 5 5 5 5    Side Iliopsoas Quads Hamstring PF DF EHL  R 5 5 5 5 4  4-  L 5 5 5 5 5 5    Reflexes are 1+ and symmetric at the biceps, triceps, brachioradialis, patella and achilles.   Hoffman's is absent.   Bilateral upper and lower extremity sensation is intact to light touch.    No evidence of dysmetria noted.  Gait is abnormal - antalgic.  +SLR RLE     Medical Decision Making  Imaging: MRI L spine 04/04/2024 IMPRESSION: 1. Progressive disc degeneration at L4-5 since 2023, including a new right-sided disc protrusion contributing to moderate to severe spinal stenosis and severe right lateral recess stenosis with right L5 nerve root impingement. 2. Unchanged mild spinal stenosis at L3-4 and moderate left foraminal stenosis at L5-S1.   Electronically signed by: Dasie Hamburg MD 04/04/2024 06:12 PM EDT RP Workstation: [id number]  CT L spine 03/30/2024 IMPRESSION: 1. L4-5 broad-based disc bulge eccentric to the right with moderate to severe central canal stenosis and right lateral recess stenosis, likely impinging the right L5 nerve. 2. L3-4 diffuse disc bulge and bilateral facet arthrosis with moderate central canal stenosis and mild-to-moderate bilateral lateral recess stenosis. 3. L2-3 broad-based disc bulge and mild facet hypertrophy with mild-to-moderate central canal stenosis and bilateral lateral recess stenosis. 4. T11-12 left paracentral posterior disc osteophyte with mild-to-moderate central canal stenosis.   Electronically signed by: Evalene Coho MD 03/30/2024 09:45 AM EDT RP Workstation: [id number]   I have personally reviewed the images and agree with  the above interpretation.  Assessment and Plan: Ms. Topor is a pleasant 68 y.o. female with right L4 and L5 mediated dysfunction with a large disc herniation at L4-5 which compresses the right L5 nerve root.  She has primarily L5 mediated symptoms.  She has objective weakness.  I do not feel that conservative management is indicated.  I recommend surgical intervention.  We will proceed with L4-5 microdiscectomy on the right side.  I discussed the planned procedure at length with the patient, including the risks, benefits, alternatives, and indications. The risks discussed include but are not limited to bleeding, infection, need for reoperation, spinal fluid leak, stroke, vision loss, anesthetic complication, coma, paralysis, and even death. I also described in detail that improvement was not guaranteed.  The patient expressed understanding of these risks, and asked that we proceed with surgery. I described the surgery in layman's terms, and gave ample opportunity for questions, which were answered to the best of my ability.   I spent a total of 30 minutes in this patient's care today. This time was spent reviewing pertinent records including imaging studies, obtaining and confirming history, performing a directed evaluation, formulating and discussing my recommendations, and documenting the visit within the medical record.      Thank you for involving me in the care of this patient.      Tyshawna Alarid K. Clois MD, [facility]

## 2024-04-09 NOTE — Progress Notes (Signed)
 Referring Physician:  Valora Lynwood FALCON, MD [address] [place],  [place] [address]  Primary Physician:  Valora Lynwood FALCON, MD  History of Present Illness: 04/17/2024 [name] is here today with a chief complaint of severe right buttock and leg pain.  She has numbness and tingling.  She has weakness in the right foot.  She is not able to walk normally.  She normally walks without assistance, she is now using a wheelchair.  She was evaluated in clinic and found to have weakness.  She is here today to discuss options.  The symptoms are causing a significant impact on the patient's life.   I have utilized the care everywhere function in epic to review the outside records available from external health systems.  Progress Note from Glade Boys, [place] on 04/03/24:  History of Present Illness: 04/03/2024 [name] has a history of HTN, migraines, asthma, GERD, hypothyroidism, benign essential tremor, anxiety, depression, FM, gastric ulcer, hyperlipidemia.    History of ACDF C4-C7 in 2014.    Seen in ED on 03/30/24 for flare up of chronic LBP and right leg pain.    She has constant right sided LBP with constant right leg pain (entire leg) to her foot x 6 days. No left leg pain. Pain is severe with standing, walking. She has numbness, tingling, weakness in right leg. No alleviating factors. No known injury. She has swelling in both legs.    She is taking neurontin  and percocet. She finishes the prednisone tomorrow.    She has been seeing [facility]. Did not have much relief with last injection in July.    Tobacco use: Does not smoke.    Bowel/Bladder Dysfunction: urinary urgency, no  bowel issues.    Conservative measures:  Physical therapy: No recent PT Multimodal medical therapy including regular antiinflammatories:  Celebrex, Gabapentin , Naproxen , Methocarbamol , Prednisone Injections:  01/11/2024 L4-L5 ESI 10/2023 L5-S1 ESI 01/2023 L5-S1 ESI 03/24/2022 Left  L5-S1 ESI     Past Surgery:  11/29/2012 C4-C7 ACDF  Review of Systems:  A 10 point review of systems is negative, except for the pertinent positives and negatives detailed in the HPI.  Past Medical History: Past Medical History:  Diagnosis Date   Anemia    Anxiety    Arthritis    Asthma    Benign essential tremor    CAD (coronary artery disease)    a.) MV 01/01/2019: EF 47%; reversible ant ischemia; b.) cCTA 01/18/2019: Ca score 200; <25& p-m LAD,diag, LCx, oRCA, p-dRCA; FFR CT analysis: LAD 0.87, LCx 0.95, RCA 0.91.   Claustrophobia    Complication of anesthesia    a.) delayed emergence; b.) PONV   Depression    Diastolic dysfunction 02/16/2017   a.) TTE 02/16/2017: EF >55%, triv MR/PR, mild TR, G1DD; b.) TTE 02/13/2019: EF 45%, mild LVH, septal and apical HK, triv MR, mild TR.   Dyspnea    Fibromyalgia    Fusion of spine of cervical region    Gastric ulcer    GERD (gastroesophageal reflux disease)    Hemorrhoids    Hepatic steatosis    History of chicken pox    History of motion sickness    History of pleurisy    History of seasonal allergies    Hyperlipidemia    Hypertension    Leukocytosis    Migraines    Palpitations    PONV (postoperative nausea and vomiting)    Radiculopathy of cervical region  Snores    a.) has had negative PSG testing   Type 2 diabetes mellitus treated with insulin  [facility])     Past Surgical History: Past Surgical History:  Procedure Laterality Date   ACDF C4-C7 WITH ALLOGRAFT INSERTION N/A 11/29/2012   ACHILLES TENDON LENGTHENING Bilateral 2010   ARTHRODESIS ANTERIOR CERVICAL SPINE N/A 12/04/2012   BREAST BIOPSY Bilateral 1970's   BREAST BIOPSY Left    stereotactic   CARPAL TUNNEL RELEASE Bilateral    CESAREAN SECTION     COLONOSCOPY WITH PROPOFOL  N/A 04/14/2016   Procedure: COLONOSCOPY WITH PROPOFOL ;  Surgeon: Lamar ONEIDA Holmes, MD;  Location: [facility] ENDOSCOPY;  Service: Endoscopy;  Laterality: N/A;   COLONOSCOPY WITH PROPOFOL  N/A  02/15/2020   Procedure: COLONOSCOPY WITH PROPOFOL ;  Surgeon: Maryruth Ole ONEIDA, MD;  Location: [facility] ENDOSCOPY;  Service: Endoscopy;  Laterality: N/A;   ESOPHAGOGASTRODUODENOSCOPY (EGD) WITH PROPOFOL  N/A 04/14/2016   Procedure: ESOPHAGOGASTRODUODENOSCOPY (EGD) WITH PROPOFOL ;  Surgeon: Lamar ONEIDA Holmes, MD;  Location: [facility] ENDOSCOPY;  Service: Endoscopy;  Laterality: N/A;   ESOPHAGOGASTRODUODENOSCOPY (EGD) WITH PROPOFOL  N/A 02/15/2020   Procedure: ESOPHAGOGASTRODUODENOSCOPY (EGD) WITH PROPOFOL ;  Surgeon: Maryruth Ole ONEIDA, MD;  Location: [facility] ENDOSCOPY;  Service: Endoscopy;  Laterality: N/A;   FLEXIBLE SIGMOIDOSCOPY N/A 11/03/2016   Procedure: FLEXIBLE SIGMOIDOSCOPY;  Surgeon: Holmes Lamar ONEIDA, MD;  Location: [facility] ENDOSCOPY;  Service: Endoscopy;  Laterality: N/A;   FLEXIBLE SIGMOIDOSCOPY N/A 01/31/2017   Procedure: FLEXIBLE SIGMOIDOSCOPY;  Surgeon: Holmes Lamar ONEIDA, MD;  Location: [facility] ENDOSCOPY;  Service: Endoscopy;  Laterality: N/A;   gastric ulcer     NERVE SURGERY     on back   plantar fasciitis Left    SHOULDER ARTHROSCOPY Right    TONSILLECTOMY     TOTAL [name] ARTHROPLASTY Right 11/27/2013   TOTAL [name] ARTHROPLASTY Left 01/04/2022   Procedure: TOTAL [name] ARTHROPLASTY;  Surgeon: Leora Lynwood SAUNDERS, MD;  Location: [facility] ORS;  Service: Orthopedics;  Laterality: Left;    Allergies: Allergies as of 04/17/2024 - Review Complete 04/10/2024  Allergen Reaction Noted   Cat dander Other (See Comments) and Itching 02/28/2013   Oxycodone Other (See Comments) 09/30/2015   Penicillin g Itching 04/03/2024   Statins Other (See Comments) 02/13/2020   Zosyn  [piperacillin  sod-tazobactam so] Swelling 11/03/2016    Medications:  Current Outpatient Medications:    amLODipine  (NORVASC ) 10 MG tablet, Take by mouth., Disp: , Rfl:    buPROPion (WELLBUTRIN XL) 150 MG 24 hr tablet, Take 150 mg by mouth daily., Disp: , Rfl:    carbidopa-levodopa (SINEMET IR) 25-100 MG tablet, Take by mouth., Disp: , Rfl:     citalopram  (CELEXA ) 10 MG tablet, Take 1 tablet by mouth daily., Disp: , Rfl:    cyanocobalamin  (VITAMIN B12) 1000 MCG/ML injection, Inject 1,000 mcg into the muscle., Disp: , Rfl:    gabapentin  (NEURONTIN ) 300 MG capsule, Take 300 mg by mouth as needed., Disp: , Rfl:    HYDROcodone -acetaminophen  (NORCO) 10-325 MG tablet, Take 1 tablet by mouth every 4 (four) hours as needed., Disp: 30 tablet, Rfl: 0   insulin  degludec (TRESIBA) 100 UNIT/ML FlexTouch Pen, Inject 25 Units into the skin., Disp: , Rfl:    olmesartan (BENICAR) 40 MG tablet, Take 1 tablet by mouth 2 (two) times daily., Disp: , Rfl:    pantoprazole  (PROTONIX ) 40 MG tablet, Take 40 mg by mouth daily. , Disp: , Rfl:    pioglitazone (ACTOS) 30 MG tablet, Take 30 mg by mouth daily., Disp: , Rfl:    spironolactone (ALDACTONE) 25 MG tablet,  Take 25 mg by mouth in the morning., Disp: , Rfl:    Tuberculin-Allergy Syringes (VANISHPOINT TUBERCULIN SYRINGE) 25G X 1 1 ML MISC, See admin instructions., Disp: , Rfl:   Social History: Social History   Tobacco Use   Smoking status: Former    Current packs/day: 0.00    Average packs/day: 2.0 packs/day for 35.0 years (70.0 ttl pk-yrs)    Types: Cigarettes    Start date: 07/01/1965    Quit date: 07/01/2000    Years since quitting: 23.8   Smokeless tobacco: Never  Vaping Use   Vaping status: Never Used  Substance Use Topics   Alcohol use: No   Drug use: No    Family Medical History: Family History  Problem Relation Age of Onset   Breast cancer Mother 63   Hypertension Father    Cancer Sister        Hodkin's lymphoma    Physical Examination: Vitals:   04/17/24 1616  BP: (!) 152/76    General: Patient is in no apparent distress. Attention to examination is appropriate.  Neck:   Supple.  Full range of motion.  Respiratory: Patient is breathing without any difficulty.   NEUROLOGICAL:     Awake, alert, oriented to person, place, and time.  Speech is clear and fluent.    Cranial Nerves: Pupils equal round and reactive to light.  Facial tone is symmetric.  Facial sensation is symmetric. Shoulder shrug is symmetric. Tongue protrusion is midline.  There is no pronator drift.  Strength: Side Biceps Triceps Deltoid Interossei Grip Wrist Ext. Wrist Flex.  R 5 5 5 5 5 5 5   L 5 5 5 5 5 5 5    Side Iliopsoas Quads Hamstring PF DF EHL  R 5 5 5 5 4  4-  L 5 5 5 5 5 5    Reflexes are 1+ and symmetric at the biceps, triceps, brachioradialis, patella and achilles.   Hoffman's is absent.   Bilateral upper and lower extremity sensation is intact to light touch.    No evidence of dysmetria noted.  Gait is abnormal - antalgic.  +SLR RLE     Medical Decision Making  Imaging: MRI L spine 04/04/2024 IMPRESSION: 1. Progressive disc degeneration at L4-5 since 2023, including a new right-sided disc protrusion contributing to moderate to severe spinal stenosis and severe right lateral recess stenosis with right L5 nerve root impingement. 2. Unchanged mild spinal stenosis at L3-4 and moderate left foraminal stenosis at L5-S1.   Electronically signed by: Dasie Hamburg MD 04/04/2024 06:12 PM EDT RP Workstation: [id number]  CT L spine 03/30/2024 IMPRESSION: 1. L4-5 broad-based disc bulge eccentric to the right with moderate to severe central canal stenosis and right lateral recess stenosis, likely impinging the right L5 nerve. 2. L3-4 diffuse disc bulge and bilateral facet arthrosis with moderate central canal stenosis and mild-to-moderate bilateral lateral recess stenosis. 3. L2-3 broad-based disc bulge and mild facet hypertrophy with mild-to-moderate central canal stenosis and bilateral lateral recess stenosis. 4. T11-12 left paracentral posterior disc osteophyte with mild-to-moderate central canal stenosis.   Electronically signed by: Evalene Coho MD 03/30/2024 09:45 AM EDT RP Workstation: [id number]   I have personally reviewed the images and agree with  the above interpretation.  Assessment and Plan: [name] is a pleasant 68 y.o. female with right L4 and L5 mediated dysfunction with a large disc herniation at L4-5 which compresses the right L5 nerve root.  She has primarily L5 mediated symptoms.  She has objective weakness.  I do not feel that conservative management is indicated.  I recommend surgical intervention.  We will proceed with L4-5 microdiscectomy on the right side.  I discussed the planned procedure at length with the patient, including the risks, benefits, alternatives, and indications. The risks discussed include but are not limited to bleeding, infection, need for reoperation, spinal fluid leak, stroke, vision loss, anesthetic complication, coma, paralysis, and even death. I also described in detail that improvement was not guaranteed.  The patient expressed understanding of these risks, and asked that we proceed with surgery. I described the surgery in layman's terms, and gave ample opportunity for questions, which were answered to the best of my ability.   I spent a total of 30 minutes in this patient's care today. This time was spent reviewing pertinent records including imaging studies, obtaining and confirming history, performing a directed evaluation, formulating and discussing my recommendations, and documenting the visit within the medical record.      Thank you for involving me in the care of this patient.      Tyshawna Alarid K. Clois MD, [facility]

## 2024-04-10 ENCOUNTER — Telehealth: Payer: Self-pay | Admitting: Neurosurgery

## 2024-04-10 DIAGNOSIS — M48061 Spinal stenosis, lumbar region without neurogenic claudication: Secondary | ICD-10-CM

## 2024-04-10 DIAGNOSIS — R29898 Other symptoms and signs involving the musculoskeletal system: Secondary | ICD-10-CM

## 2024-04-10 DIAGNOSIS — M5416 Radiculopathy, lumbar region: Secondary | ICD-10-CM

## 2024-04-10 DIAGNOSIS — M47816 Spondylosis without myelopathy or radiculopathy, lumbar region: Secondary | ICD-10-CM

## 2024-04-10 MED ORDER — HYDROCODONE-ACETAMINOPHEN 10-325 MG PO TABS: 1.0000 | ORAL_TABLET | ORAL | 0 refills | Status: DC | PRN

## 2024-04-10 NOTE — Telephone Encounter (Signed)
 [name] advised, no new weakness, no new bladder or bowel issues. She would like to try Hydrocodone  10 mg dose but does not want a lot sent in for the patient just a few so patient can try until her appointment. They would like this sent to [facility].  I can call and cancel 7.5 mg dose once RX is sent in for 10 mg

## 2024-04-10 NOTE — Telephone Encounter (Signed)
 Left detailed message with information on [name] voicemail-ok per DPR on file

## 2024-04-10 NOTE — Addendum Note (Signed)
 Addended byBETHA HILMA HASTINGS on: 04/10/2024 03:54 PM   Modules accepted: Orders

## 2024-04-10 NOTE — Telephone Encounter (Signed)
 She would be out of her norco 7.5. PMP reviewed and is appropriate.   Please let her know I sent in the norco 10mg . Remind her this has tylenol  in it and she should not take extra tylenol .   Remind her if she cannot get around safely at home, has new bowel/bladder issues, or new weakness then she should go to the ED.

## 2024-04-10 NOTE — Telephone Encounter (Signed)
 Patient's caregiver, Ehresman called to see if the patient could be seen sooner than 04/17/2024. She states that the patient's pain is getting worse and is rating the pain at a 10/10. She states that the patient has been in bed the last two days and is barely able to move her right leg when she does get up to walk. Please advise.

## 2024-04-10 NOTE — Telephone Encounter (Signed)
 Called Arianna at [phone] and she was with patient, she is stating that patient still has pain right sided back pain but mainly right hip area pain radiating to her right mid calf area. It is intense and constant, pain started to get worse since 04/07/2024, since then she is not able to get out of bed much at all due to getting severe pain when she tries to move. She did not fall or had any injury to make this get worse all of a sudden. She still has numbness, tingling, weakness in right leg  She is taking Gabapentin  300 mg 1 three time daily, Hydrocodone  1 tablet every 4 hours in the last couple of days before the flare up of pain she was trying to stretch it out for longer periods of time. She finished Prednisone sometime last week. These medications are not helping the pain at all. She is using heating pad with no relief, she is not taking any other medications for the pain.  Dr Clois does not have anything open this week at this time, patient is schedule to be seen on 10/21 at this time. Advised Arianna we would call back with advise on how to proceed.

## 2024-04-10 NOTE — Telephone Encounter (Signed)
 Is she having any new weakness?  Any bowel or bladder issues?   She was on the hydrocodone  7.5. If this was not helping, can try going to hydrocodone  10mg .   If she has any new weakness, bowel/bladder issues, or if she is not safe getting around at home then I would recommend she go to ED.

## 2024-04-17 ENCOUNTER — Encounter: Payer: Self-pay | Admitting: Neurosurgery

## 2024-04-17 ENCOUNTER — Other Ambulatory Visit: Payer: Self-pay

## 2024-04-17 ENCOUNTER — Ambulatory Visit (INDEPENDENT_AMBULATORY_CARE_PROVIDER_SITE_OTHER): Admitting: Neurosurgery

## 2024-04-17 VITALS — BP 152/76 | Ht 63.0 in | Wt 240.0 lb

## 2024-04-17 DIAGNOSIS — M5416 Radiculopathy, lumbar region: Secondary | ICD-10-CM

## 2024-04-17 DIAGNOSIS — M5126 Other intervertebral disc displacement, lumbar region: Secondary | ICD-10-CM | POA: Diagnosis not present

## 2024-04-17 DIAGNOSIS — R29898 Other symptoms and signs involving the musculoskeletal system: Secondary | ICD-10-CM

## 2024-04-17 DIAGNOSIS — Z01818 Encounter for other preprocedural examination: Secondary | ICD-10-CM

## 2024-04-17 NOTE — Patient Instructions (Addendum)
 Please see below for information in regards to your upcoming surgery:   Planned surgery: Right L4/5 microdiscectomy   Surgery date: 04/25/24 at [facility] (Medical Mall: [address], [place], [place] [address]) - you will find out your arrival time the business day before your surgery.   Pre-op appointment at [facility] Pre-admit Testing: you will receive a call with a date/time for this appointment. If you are scheduled for an in person appointment, Pre-admit Testing is located on the first floor of the [facility] building, [address], [address]. During this appointment, they will advise you which medications you can take the morning of surgery, and which medications you will need to hold for surgery. Labs (such as blood work, EKG) may be done at your pre-op appointment. You are not required to fast for these labs. Should you need to change your pre-op appointment, please call Pre-admit testing at [phone].      Surgical clearance: we will send a clearance form to Dr Valora. They may wish to see you in their office prior to signing the clearance form. If so, they may call you to schedule an appointment.     Common restrictions after spine surgery: No bending, lifting, or twisting ("BLT"). Avoid lifting objects heavier than 10 pounds for the first 6 weeks after surgery. Where possible, avoid household activities that involve lifting, bending, reaching, pushing, or pulling such as laundry, vacuuming, grocery shopping, and childcare. Try to arrange for help from friends and family for these activities while you heal. Do not drive while taking prescription pain medication. Weeks 6 through 12 after surgery: avoid lifting more than 25 pounds.      How to contact us :  If you have any questions/concerns before or after surgery, you can reach us  at [phone], or you can send a mychart message. We can be reached by phone or mychart 8am-4pm,  Monday-Friday.  *Please note: Calls after 4pm are forwarded to a third party answering service. Mychart messages are not routinely monitored during evenings, weekends, and holidays. Please call our office to contact the answering service for urgent concerns during non-business hours.     If you have FMLA/disability paperwork, please drop it off or fax it to [phone]   Appointments/FMLA & disability paperwork: Reche Hait, & Nichole Registered Nurse/Surgery scheduler: Kerryn Tennant, RN & Katie, RN Certified Medical Assistants: Don, CMA, Elenor, CMA, Damien, CMA, & Auston, [place] Physician Assistants: Lyle Decamp, PA-C, Edsel Goods, PA-C & Glade Boys, PA-C Surgeons: Penne Sharps, MD & Reeves Daisy, MD   [facility] PREADMIT TESTING VISIT and SURGERY INFORMATION SHEET   Now that surgery has been scheduled you can anticipate several phone calls from [facility] services. A pharmacy technician will call you to verify your current list of medications taken at home.               The Pre-Service Center will call to verify your insurance information and to give you billing estimates and information.             The Preadmit Testing Office will be calling to schedule a visit to obtain information for the anesthesia team and provide instructions on preparation for surgery.  What can you expect for the Preadmit Testing Visit: Appointments may be scheduled in-person or by telephone.  If a telephone visit is scheduled, you may be asked to come into the office to have lab tests or other studies performed.   This visit  will not be completed any greater than 14 days prior to your surgery.  If your surgery has been scheduled for a future date, please do not be alarmed if we have not contacted you to schedule an appointment more than a month prior to the surgery date.    Please be prepared to provide the following information during this appointment:             -Personal medical history                                               -Medication and allergy list            -Any history of problems with anesthesia              -Recent lab work or diagnostic studies            -Please notify us  of any needs we should be aware of to provide the best care possible           -You will be provided with instructions on how to prepare for your surgery.    On The Day of Surgery:  You must have a driver to take you home after surgery, you will be asked not to drive for 24 hours following surgery.  Taxi, Gisele and non-medical transport will not be acceptable means of transportation unless you have a responsible individual who will be traveling with you.  Visitors in the surgical area:   2 people will be able to visit you in your room once your preparation for surgery has been completed. During surgery, your visitors will be asked to wait in the Surgery Waiting Area.  It is not a requirement for them to stay, if they prefer to leave and come back.  Your visitor(s) will be given an update once the surgery has been completed.  No visitors are allowed in the initial recovery room to respect patient privacy and safety.  Once you are more awake and transfer to the secondary recovery area, or are transferred to an inpatient room, visitors will again be able to see you.  To respect and protect your privacy: We will ask on the day of surgery who your driver will be and what the contact number for that individual will be. We will ask if it is okay to share information with this individual, or if there is an alternative individual that we, or the surgeon, should contact to provide updates and information. If family or friends come to the surgical information desk requesting information about you, who you have not listed with us , no information will be given.   It may be helpful to designate someone as the main contact who will be responsible for updating your other friends  and family.    PREADMIT TESTING OFFICE: [phone] SAME DAY SURGERY: [phone] We look forward to caring for you before and throughout the process of your surgery.

## 2024-04-18 ENCOUNTER — Telehealth: Payer: Self-pay | Admitting: Neurosurgery

## 2024-04-18 DIAGNOSIS — R29898 Other symptoms and signs involving the musculoskeletal system: Secondary | ICD-10-CM

## 2024-04-18 DIAGNOSIS — M5416 Radiculopathy, lumbar region: Secondary | ICD-10-CM

## 2024-04-18 NOTE — Telephone Encounter (Signed)
 Patient's sister is calling to request an order for a hospital bed. She states that her sister's bed is too high for her to get in and out of after she has surgery. She would like to pickup the order when ready.

## 2024-04-18 NOTE — Telephone Encounter (Signed)
 During her appointment yesterday, we discussed the use of a step stool (as long as she avoids bending and twisting during the first 6 weeks after surgery). Referral for hospital bed has been placed.   Ritta, please print and notify her sister to pickup after Dr Clois goo it. Thank you!

## 2024-04-19 NOTE — Telephone Encounter (Signed)
 Order printed and patient's sister will come by today to pickup.

## 2024-04-20 ENCOUNTER — Other Ambulatory Visit: Payer: Self-pay

## 2024-04-20 ENCOUNTER — Encounter: Payer: Self-pay | Admitting: Neurosurgery

## 2024-04-20 ENCOUNTER — Encounter
Admission: RE | Admit: 2024-04-20 | Discharge: 2024-04-20 | Disposition: A | Discharge status: Home / Self Care | Source: Ambulatory Visit | Attending: Neurosurgery | Admitting: Neurosurgery

## 2024-04-20 VITALS — BP 128/83 | HR 82 | Resp 16 | Ht 63.0 in | Wt 242.3 lb

## 2024-04-20 DIAGNOSIS — E119 Type 2 diabetes mellitus without complications: Secondary | ICD-10-CM | POA: Diagnosis not present

## 2024-04-20 DIAGNOSIS — M47817 Spondylosis without myelopathy or radiculopathy, lumbosacral region: Secondary | ICD-10-CM

## 2024-04-20 DIAGNOSIS — Z9181 History of falling: Secondary | ICD-10-CM

## 2024-04-20 DIAGNOSIS — Z01818 Encounter for other preprocedural examination: Secondary | ICD-10-CM | POA: Diagnosis present

## 2024-04-20 DIAGNOSIS — I10 Essential (primary) hypertension: Secondary | ICD-10-CM | POA: Insufficient documentation

## 2024-04-20 DIAGNOSIS — Z794 Long term (current) use of insulin: Secondary | ICD-10-CM | POA: Insufficient documentation

## 2024-04-20 DIAGNOSIS — Z01812 Encounter for preprocedural laboratory examination: Secondary | ICD-10-CM

## 2024-04-20 DIAGNOSIS — Z01810 Encounter for preprocedural cardiovascular examination: Secondary | ICD-10-CM

## 2024-04-20 HISTORY — DX: Adverse effect of antihyperlipidemic and antiarteriosclerotic drugs, initial encounter: T46.6X5A

## 2024-04-20 HISTORY — DX: Spinal stenosis, cervical region: M48.02

## 2024-04-20 HISTORY — DX: Personal history of other infectious and parasitic diseases: Z86.19

## 2024-04-20 HISTORY — DX: Type 2 diabetes mellitus with proliferative diabetic retinopathy without macular edema, unspecified eye: E11.3599

## 2024-04-20 HISTORY — DX: Spondylolysis, lumbar region: M43.06

## 2024-04-20 HISTORY — DX: Parkinson's disease without dyskinesia, without mention of fluctuations: G20.A1

## 2024-04-20 HISTORY — DX: Obesity, unspecified: E66.9

## 2024-04-20 HISTORY — DX: Spinal stenosis, lumbar region without neurogenic claudication: M48.061

## 2024-04-20 HISTORY — DX: Family history of other specified conditions: Z84.89

## 2024-04-20 HISTORY — DX: History of falling: Z91.81

## 2024-04-20 HISTORY — DX: Radiculopathy, cervical region: M54.12

## 2024-04-20 HISTORY — DX: Drug-induced myopathy: G72.0

## 2024-04-20 HISTORY — DX: Pain in thoracic spine: M54.6

## 2024-04-20 LAB — BASIC METABOLIC PANEL WITH GFR
Anion gap: 12 (ref 5–15)
BUN: 22 mg/dL (ref 8–23)
CO2: 26 mmol/L (ref 22–32)
Calcium: 8.8 mg/dL — ABNORMAL LOW (ref 8.9–10.3)
Chloride: 100 mmol/L (ref 98–111)
Creatinine, Ser: 0.88 mg/dL (ref 0.44–1.00)
GFR, Estimated: >60 mL/min (ref >60)
Glucose, Bld: 108 mg/dL — ABNORMAL HIGH (ref 70–99)
Potassium: 3.6 mmol/L (ref 3.5–5.1)
Sodium: 138 mmol/L (ref 135–145)

## 2024-04-20 LAB — TYPE AND SCREEN
ABO/RH(D): A POS
Antibody Screen: NEGATIVE

## 2024-04-20 LAB — SURGICAL PCR SCREEN
MRSA, PCR: NEGATIVE
Staphylococcus aureus: NEGATIVE

## 2024-04-20 NOTE — Progress Notes (Signed)
 Perioperative / Anesthesia Services  Pre-Admission Testing Clinical Review / Pre-Operative Anesthesia Consult  Date: 04/20/24  PATIENT DEMOGRAPHICS: Name: [name] DOB: 08-05-1955 MRN:   [id number]  Note: Available PAT nursing documentation and vital signs have been reviewed. Clinical nursing staff has updated patient's PMH/PSHx, current medication list, and drug allergies/intolerances to ensure complete and comprehensive history available to assist care teams in MDM as it pertains to the aforementioned surgical procedure and anticipated anesthetic course. Extensive review of available clinical information personally performed. Nursing documentation reviewed. [place] PMH and PSHx updated with any diagnoses and/or procedures that I have knowledge of that may have been inadvertently omitted during her intake with the pre-admission testing department's nursing staff.  PLANNED SURGICAL PROCEDURE(S):   Case: [id number] Date/Time: 04/25/24 1244   Procedure: LUMBAR LAMINECTOMY/DECOMPRESSION MICRODISCECTOMY 1 LEVEL (Right) - RIGHT L4/5 MICRODISCECTOMY   Anesthesia type: General   Diagnosis:      Lumbar radiculopathy [M54.16]     Right leg weakness [R29.898]   Pre-op diagnosis:      M54.16 lumbar radiculopathy     R29.898 right leg weakness   Location: [facility] OR ROOM 03 / [facility]   Surgeons: Clois Fret, MD        CLINICAL DISCUSSION: [name] is a 68 y.o. female who is submitted for pre-surgical anesthesia review and clearance prior to her undergoing the above procedure.  Patient is a Former Smoker (70 pack years; quit 06/2000). Pertinent PMH includes: CAD, diastolic dysfunction, chronic cerebral microvascular disease, palpitations, aortic atherosclerosis, HTN, HLD, T2DM, hypothyroidism, GERD (on daily PPI), gastric ulcer, asthma, dyspnea, anemia, fibromyalgia, OA, BILATERAL thoracic back pain, cervical DDD (s/p ACDF C4-C7), essential tremor, anxiety (on BZO),  depression.  Patient is followed by cardiology Andrea, MD). She was last seen in the cardiology clinic on 11/15/2023; notes reviewed. At the time of her clinic visit, patient complaining of infrequent episodes of palpitations that were worse when supine.  She has chronic exertional dyspnea that was reported to be stable and at baseline.  Patient also experiencing infrequent episodes of chest discomfort that are brief in nature lasting about 3 to 5 seconds occurring when supine with no identifiable provoking/palliating factors. Patient denied any PND, orthopnea, significant peripheral edema, weakness, fatigue, vertiginous symptoms, or presyncope/syncope. Patient with a past medical history significant for cardiovascular diagnoses. Documented physical exam was grossly benign, providing no evidence of acute exacerbation and/or decompensation of the patient's known cardiovascular conditions.  Myocardial perfusion imaging study was performed on 01/01/2019 revealing a mildly reduced left ventricular systolic function with an EF of 47%.  There were no regional wall motion abnormalities.  SPECT images demonstrated a small reversible perfusion abnormality of mild intensity present in the anterior region on stress images.TID ratio = 1.10 (normal range </= 1.2).  Study suggestive of borderline anterior ischemia.  Clinical correlation was recommended.  CT heart angiogram was obtained on 01/18/2019.  Study demonstrated an elevated coronary calcium score of 200.  Multifocal calcified atherosclerotic plaques noted in the LAD and RCA causing <25% stenosis.  FFR analysis was performed (ranges: < 0.75 high likelihood of hemodynamically significant stenosis, 0.76-0.80 borderline, > 0.80 normal):  Left Anterior Descending System: 0.87  Left Circumflex System: 0.95  Right Coronary System: 0.91    Most recent TTE performed on 06/06/2023 revealed a low normal left ventricular systolic function with an EF of 50%. There was  no LVH.  There was global hypokinesis.  Left ventricular diastolic Doppler parameters consistent with abnormal  relaxation (G1DD). GLS -13.2% (normal range <-18%). Right ventricular size and function normal. There was mild mitral, trivial pulmonary, and trivial tricuspid valve regurgitation.  All transvalvular gradients were noted to be normal providing no evidence of hemodynamically significant valvular stenosis. Aorta normal in size with no evidence of ectasia or aneurysmal dilatation.  Blood pressure well controlled at 120/70 mmHg on currently prescribed CCB (amlodipine ), ARB (olmesartan), and MRA (spironolactone) therapies.  Patient is not on any type of lipid-lowering therapies for her HLD diagnosis and further ASCVD prevention.  T2DM suboptimally controlled on currently prescribed regimen; last HgbA1c was 7.5% when checked on 03/27/2024.  She does not have an OSAH diagnosis.  Functional capacity somewhat limited by patient's age and multiple medical comorbidities.  She additionally has significant back pain and fibromyalgia as additional limiting factors.  With that said, patient able to complete all of her ADLs/IADLs without cardiovascular limitation.  Per the [facility]AS[facility], patient felt to be able to achieve at least 4 METS of physical activity without experiencing any significant degrees of angina/anginal equivalent symptoms.  No changes were made to her medication regimen during her visit with cardiology.  Patient scheduled to follow-up with outpatient cardiology in 6 months or sooner if needed.  [name] is scheduled for an elective RIGHT L4-L5 MICRODISCECTOMY on 04/25/2024 with Dr. Reeves Daisy, MD. Given patient's past medical history significant for cardiovascular diagnoses, presurgical cardiac clearance was sought by the PAT team. Per cardiology, this patient is optimized for surgery and may proceed with the planned procedural course with a LOW risk of significant perioperative cardiovascular  complications.  In review of her medication reconciliation, the patient is not noted to be taking any type of anticoagulation or antiplatelet therapies that would need to be held during her perioperative course.  Patient reports previous perioperative complications with anesthesia in the past.Patient has a PMH (+) for PONV. Symptoms and history of PONV will be discussed with patient by anesthesia team on the day of her procedure. Interventions will be ordered as deemed necessary based on patient's individual care needs as determined by anesthesiologist.  Patient also reporting a history of (+) delayed/prolonged emergence from anesthesia in the past.  In review her EMR, it is noted that patient underwent a general + neuraxial anesthetic course here at [facility] ([facility]) in 12/2021 without documented complications.   MOST RECENT VITAL SIGNS:    04/20/2024    1:07 PM 04/17/2024    4:16 PM 04/03/2024    2:12 PM  Vitals with BMI  Height 5' 3 5' 3 5' 3  Weight 242 lbs 5 oz 240 lbs 243 lbs  BMI 42.93 42.52 43.06  Systolic 128 152 851  Diastolic 83 76 72  Pulse 82     PROVIDERS/SPECIALISTS: NOTE: Primary physician provider listed below. Patient may have been seen by APP or partner within same practice.   PROVIDER ROLE / SPECIALTY LAST [facility]ER[facility] Daisy Reeves, MD Neurosurgery (Surgeon) 04/17/2024  Valora Lynwood FALCON, MD Primary Care Provider 03/27/2024  Ammon Blunt, MD Cardiology 11/15/2023  Maree Hila, MD Neurology 11/14/2023  Babara Call, MD Hematology 11/27/2021   ALLERGIES: Allergies  Allergen Reactions   Cat Dander Other (See Comments) and Itching    Congestion, watery eyes    Oxycodone Other (See Comments)    Withdrawal symptoms after stopping it from last knee surgery   Penicillin G Itching   Statins Other (See Comments)   Zosyn  [Piperacillin  Sod-Tazobactam So] Swelling    Face  got red and hot     CURRENT HOME MEDICATIONS: No  current facility-administered medications for this encounter.    amLODipine  (NORVASC ) 10 MG tablet   buPROPion (WELLBUTRIN XL) 150 MG 24 hr tablet   carbidopa-levodopa (SINEMET IR) 25-100 MG tablet   citalopram  (CELEXA ) 20 MG tablet   diazepam (VALIUM) 5 MG tablet   gabapentin  (NEURONTIN ) 300 MG capsule   HYDROcodone -acetaminophen  (NORCO) 10-325 MG tablet   insulin  degludec (TRESIBA) 100 UNIT/ML FlexTouch Pen   olmesartan (BENICAR) 40 MG tablet   pantoprazole  (PROTONIX ) 40 MG tablet   pioglitazone (ACTOS) 30 MG tablet   spironolactone (ALDACTONE) 25 MG tablet   cyanocobalamin  (VITAMIN B12) 1000 MCG/ML injection   HISTORY: Past Medical History:  Diagnosis Date   Anemia    Anxiety    Arthritis    Asthma    Benign essential tremor    Bilateral thoracic back pain    CAD (coronary artery disease)    a.) MV 01/01/2019: EF 47%; reversible ant ischemia; b.) cCTA 01/18/2019: Ca2+ = 200; < 25% p-m LAD, diag, LCx, oRCA, p-dRCA - FFR CT analysis: LAD 0.87, LCx 0.95, RCA 0.91.   Claustrophobia    Complication of anesthesia    a.) delayed emergence; b.) PONV   Depression    Diastolic dysfunction 02/16/2017   a.) TTE 02/16/2017: EF >55%, triv MR/PR, mild TR, G1DD; b.) TTE 02/13/2019: EF 45%, mild LVH, septal and apical HK, triv MR, mild TR.   Dyspnea    Family history of adverse reaction to anesthesia    a.) delayed emergence in 1st degree female relative (son)   Fibromyalgia    Fusion of spine of cervical region    Gastric ulcer    GERD (gastroesophageal reflux disease)    Hemorrhoids    Hepatic steatosis    History of motion sickness    History of recent fall 04/20/2024   History of seasonal allergies    History of sepsis    Hyperlipidemia    Hypertension    Hypothyroidism    Leukocytosis    Migraines    Obesity    Palpitations    Parkinson disease (HCC)    PONV (postoperative nausea and vomiting)    Proliferative diabetic retinopathy (HCC)    Snores    a.) has had negative  PSG testing   Spinal stenosis of cervical region with radiculopathy    Spinal stenosis of lumbar region with radiculopathy    Spondylosis of lumbar spine without myelopathy    Statin myopathy    Type 2 diabetes mellitus treated with insulin  (HCC)    Past Surgical History:  Procedure Laterality Date   ACDF C4-C7 WITH ALLOGRAFT INSERTION N/A 11/29/2012   ACHILLES TENDON LENGTHENING Bilateral 2010   ARTHRODESIS ANTERIOR CERVICAL SPINE N/A 12/04/2012   BREAST BIOPSY Bilateral 1970's   BREAST BIOPSY Left    stereotactic   CARPAL TUNNEL RELEASE Bilateral    CESAREAN SECTION     COLONOSCOPY WITH PROPOFOL  N/A 04/14/2016   Procedure: COLONOSCOPY WITH PROPOFOL ;  Surgeon: Lamar ONEIDA Holmes, MD;  Location: [facility] ENDOSCOPY;  Service: Endoscopy;  Laterality: N/A;   COLONOSCOPY WITH PROPOFOL  N/A 02/15/2020   Procedure: COLONOSCOPY WITH PROPOFOL ;  Surgeon: Maryruth Ole ONEIDA, MD;  Location: [facility] ENDOSCOPY;  Service: Endoscopy;  Laterality: N/A;   ESOPHAGOGASTRODUODENOSCOPY (EGD) WITH PROPOFOL  N/A 04/14/2016   Procedure: ESOPHAGOGASTRODUODENOSCOPY (EGD) WITH PROPOFOL ;  Surgeon: Lamar ONEIDA Holmes, MD;  Location: [facility] ENDOSCOPY;  Service: Endoscopy;  Laterality: N/A;   ESOPHAGOGASTRODUODENOSCOPY (EGD) WITH PROPOFOL  N/A  02/15/2020   Procedure: ESOPHAGOGASTRODUODENOSCOPY (EGD) WITH PROPOFOL ;  Surgeon: Maryruth Ole DASEN, MD;  Location: [facility] ENDOSCOPY;  Service: Endoscopy;  Laterality: N/A;   FLEXIBLE SIGMOIDOSCOPY N/A 11/03/2016   Procedure: FLEXIBLE SIGMOIDOSCOPY;  Surgeon: Viktoria Lamar DASEN, MD;  Location: [facility] ENDOSCOPY;  Service: Endoscopy;  Laterality: N/A;   FLEXIBLE SIGMOIDOSCOPY N/A 01/31/2017   Procedure: FLEXIBLE SIGMOIDOSCOPY;  Surgeon: Viktoria Lamar DASEN, MD;  Location: [facility] ENDOSCOPY;  Service: Endoscopy;  Laterality: N/A;   gastric ulcer     NERVE SURGERY     on back   plantar fasciitis Left    SHOULDER ARTHROSCOPY Right    TONSILLECTOMY     TOTAL KNEE ARTHROPLASTY Right 11/27/2013   TOTAL  KNEE ARTHROPLASTY Left 01/04/2022   Procedure: TOTAL KNEE ARTHROPLASTY;  Surgeon: Leora Lynwood SAUNDERS, MD;  Location: [facility] ORS;  Service: Orthopedics;  Laterality: Left;   Family History  Problem Relation Age of Onset   Breast cancer Mother 73   Hypertension Father    Cancer Sister        Hodkin's lymphoma   Social History   Tobacco Use   Smoking status: Former    Current packs/day: 0.00    Average packs/day: 2.0 packs/day for 35.0 years (70.0 ttl pk-yrs)    Types: Cigarettes    Start date: 07/01/1965    Quit date: 07/01/2000    Years since quitting: 23.8   Smokeless tobacco: Never  Substance Use Topics   Alcohol use: No   LABS:  Lab Results  Component Value Date   WBC 16.3 (H) 06/26/2022   HGB 13.8 06/26/2022   HCT 43.9 06/26/2022   MCV 84.3 06/26/2022   PLT 338 06/26/2022   Lab Results  Component Value Date   NA 140 06/26/2022   CL 104 06/26/2022   K 3.5 06/26/2022   CO2 26 06/26/2022   BUN 12 06/26/2022   CREATININE 0.76 06/26/2022   GFRNONAA >60 06/26/2022   CALCIUM 9.5 06/26/2022   ALBUMIN 4.2 06/26/2022   GLUCOSE 141 (H) 06/26/2022    ECG: Date: 04/20/2024  Time ECG obtained: 1338 PM Rate: 85 bpm Rhythm: normal sinus Axis (leads I and aVF): normal Intervals: PR 148 ms. QRS 74 ms. QTc 437 ms. ST segment and T wave changes: Nonspecific inferior ST and anterior T wave changes. Evidence of a possible, age undetermined, prior infarct:  No Comparison: Similar to previous tracing obtained on 06/26/2022   IMAGING / PROCEDURES: MR LUMBAR SPINE WO CONTRAST performed on  04/04/2024 Progressive disc degeneration at L4-5 since 2023, including a new right-sided disc protrusion contributing to moderate to severe spinal stenosis and severe right lateral recess stenosis with right L5 nerve root impingement. Unchanged mild spinal stenosis at L3-4 and moderate left foraminal stenosis at L5-S1.  MR BRAIN WO CONTRAST performed on 04/29/2023 No evidence of an acute  intracranial abnormality. Moderate chronic small vessel ischemic changes within the cerebral white matter, significantly progressed from the prior brain MRI of 12/25/2009. Mild generalized cerebral atrophy.  TRANSTHORACIC ECHOCARDIOGRAM performed on 02/13/2019 Mildly reduced left ventricular systolic function with an EF of 45% Mild LVH Septal and apical hypokinesis noted Trivial MR Mild TR No AR or PR Normal gradients; no valvular stenosis No pericardial effusion   CT HEART ANGIOGRAM INCLUDING 3D IMAGING; CT FRACTIONAL FLOW RESERVE performed on 01/18/2019 Coronary artery calcium score: 200  Normal LV systolic function without regional wall motion abnormalities. Calculated  LVEF of 69%.  Non-obstructive coronary artery disease.  CAD-RADS* 1.  Multifocal calcified atherosclerotic plaques  predominantly in the LAD and RCA, all with less than 25% stenosis.  No flow-limiting stenosis detected with CT-FFR.  Left Anterior Descending System: 0.87  Left Circumflex System: 0.95  Right Coronary System: 0.91     MYOCARDIAL PERFUSION IMAGING STUDY (LEXISCAN) performed on 01/01/2019 Mildly reduced left ventricular systolic function with an EF of 47% Normal myocardial thickening and wall motion Artifact was noted Left ventricular cavity size normal SPECT images demonstrate a small reversible perfusion abnormality of mild intensity present in the anterior region on stress images consistent with borderline anterior ischemia  IMPRESSION AND PLAN: [name] has been referred for pre-anesthesia review and clearance prior to her undergoing the planned anesthetic and procedural courses. Available labs, pertinent testing, and imaging results were personally reviewed by me in preparation for upcoming operative/procedural course. [facility] medical record has been updated following extensive record review and patient interview with PAT staff.   This patient has been appropriately cleared by cardiology  with an overall LOW risk of patient experiencing significant perioperative cardiovascular complications. Based on clinical review performed today (04/20/24), barring any significant acute changes in the patient's overall condition, it is anticipated that she will be able to proceed with the planned surgical intervention. Any acute changes in clinical condition may necessitate her procedure being postponed and/or cancelled. Patient will meet with anesthesia team (MD and/or CRNA) on the day of her procedure for preoperative evaluation/assessment. Questions regarding anesthetic course will be fielded at that time.   Pre-surgical instructions were reviewed with the patient during his PAT appointment, and questions were fielded to satisfaction by PAT clinical staff. She has been instructed on which medications that she will need to hold prior to surgery, as well as the ones that have been deemed safe/appropriate to take on the day of her procedure. As part of the general education provided by PAT, patient made aware both verbally and in writing, that she would need to abstain from the use of any illegal substances during her perioperative course. She was advised that failure to follow the provided instructions could necessitate case cancellation or result in serious perioperative complications up to and including death. Patient encouraged to contact PAT and/or her surgeon's office to discuss any questions or concerns that may arise prior to surgery; verbalized understanding.   Dorise Pereyra, MSN, APRN, FNP-C, CEN [facility]  Perioperative Services Nurse Practitioner Phone: [phone] Fax: [phone] 04/20/24 3:15 PM  NOTE: This note has been prepared using Dragon dictation software. Despite my best ability to proofread, there is always the potential that unintentional transcriptional errors may still occur from this process.

## 2024-04-20 NOTE — Telephone Encounter (Signed)
 I did a letter and sent it to her in MyChart.

## 2024-04-20 NOTE — Telephone Encounter (Signed)
 Patient's sister called our office and was notified of the information. Patient's sister, expressed understanding.

## 2024-04-20 NOTE — Patient Instructions (Addendum)
 Your procedure is scheduled on:04-25-24 Wednesday Report to the Registration Desk on the 1st floor of the [facility].Then proceed to the 2nd floor Surgery Desk To find out your arrival time, please call [phone] between 1PM - 3PM on:04-24-24 Tuesday If your arrival time is 6:00 am, do not arrive before that time as the [facility] entrance doors do not open until 6:00 am.  REMEMBER: Instructions that are not followed completely may result in serious medical risk, up to and including death; or upon the discretion of your surgeon and anesthesiologist your surgery may need to be rescheduled.  Do not eat food after midnight the night before surgery.  No gum chewing or hard candies.  You may however, drink Water  up to 2 hours before you are scheduled to arrive for your surgery. Do not drink anything within 2 hours of your scheduled arrival time.  One week prior to surgery:Stop NOW (04-20-24) Stop ANY OVER THE COUNTER supplements until after surgery  Continue taking all of your other prescription medications up until the day of surgery.  ON THE DAY OF SURGERY ONLY TAKE THESE MEDICATIONS WITH SIPS OF WATER : -buPROPion (WELLBUTRIN XL)  -carbidopa-levodopa (SINEMET IR)  -gabapentin  (NEURONTIN )  -You may take diazepam (VALIUM) if needed for anxiety  Take half of your Tresiba Insulin  (5 units) the night before surgery and NO Insulin  the morning of surgery  No Alcohol for 24 hours before or after surgery.  No Smoking including e-cigarettes for 24 hours before surgery.  No chewable tobacco products for at least 6 hours before surgery.  No nicotine patches on the day of surgery.  Do not use any recreational drugs for at least a week (preferably 2 weeks) before your surgery.  Please be advised that the combination of cocaine and anesthesia may have negative outcomes, up to and including death. If you test positive for cocaine, your surgery will be cancelled.  On the morning of surgery  brush your teeth with toothpaste and water , you may rinse your mouth with mouthwash if you wish. Do not swallow any toothpaste or mouthwash.  Use CHG Soap as directed on instruction sheet.  Do not wear jewelry, make-up, hairpins, clips or nail polish.  For welded (permanent) jewelry: bracelets, anklets, waist bands, etc.  Please have this removed prior to surgery.  If it is not removed, there is a chance that hospital personnel will need to cut it off on the day of surgery.  Do not wear lotions, powders, or perfumes.   Do not shave body hair from the neck down 48 hours before surgery.  Contact lenses, hearing aids and dentures may not be worn into surgery.  Do not bring valuables to the hospital. [facility] is not responsible for any missing/lost belongings or valuables.   Notify your doctor if there is any change in your medical condition (cold, fever, infection).  Wear comfortable clothing (specific to your surgery type) to the hospital.  After surgery, you can help prevent lung complications by doing breathing exercises.  Take deep breaths and cough every 1-2 hours. Your doctor may order a device called an Incentive Spirometer to help you take deep breaths. When coughing or sneezing, hold a pillow firmly against your incision with both hands. This is called "splinting." Doing this helps protect your incision. It also decreases belly discomfort.  If you are being admitted to the hospital overnight, leave your suitcase in the car. After surgery it may be brought to your room.  In case of  increased patient census, it may be necessary for you, the patient, to continue your postoperative care in the Same Day Surgery department.  If you are being discharged the day of surgery, you will not be allowed to drive home. You will need a responsible individual to drive you home and stay with you for 24 hours after surgery.   If you are taking public transportation, you will need to have a  responsible individual with you.  Please call the Pre-admissions Testing Dept. at [phone] if you have any questions about these instructions.  Surgery Visitation Policy:  Patients having surgery or a procedure may have two visitors.  Children under the age of 7 must have an adult with them who is not the patient.  Inpatient Visitation:    Visiting hours are 7 a.m. to 8 p.m. Up to four visitors are allowed at one time in a patient room. The visitors may rotate out with other people during the day.  One visitor age 1 or older may stay with the patient overnight and must be in the room by 8 p.m.    Pre-operative 4 CHG Bath Instructions   You can play a key role in reducing the risk of infection after surgery. Your skin needs to be as free of germs as possible. You can reduce the number of germs on your skin by washing with CHG (chlorhexidine  gluconate) soap before surgery. CHG is an antiseptic soap that kills germs and continues to kill germs even after washing.   DO NOT use if you have an allergy to chlorhexidine /CHG or antibacterial soaps. If your skin becomes reddened or irritated, stop using the CHG and notify one of our RNs at [phone].   Please shower with the CHG soap starting 4 days before surgery using the following schedule:     Please keep in mind the following:  DO NOT shave, including legs and underarms, starting the day of your first shower.   You may shave your face at any point before/day of surgery.  Place clean sheets on your bed the day you start using CHG soap. Use a clean washcloth (not used since being washed) for each shower. DO NOT sleep with pets once you start using the CHG.   CHG Shower Instructions:  If you choose to wash your hair and private area, wash first with your normal shampoo/soap.  After you use shampoo/soap, rinse your hair and body thoroughly to remove shampoo/soap residue.  Turn the water  OFF and apply about 3 tablespoons (45 ml)  of CHG soap to a CLEAN washcloth.  Apply CHG soap ONLY FROM YOUR NECK DOWN TO YOUR TOES (washing for 3-5 minutes)  DO NOT use CHG soap on face, private areas, open wounds, or sores.  Pay special attention to the area where your surgery is being performed.  If you are having back surgery, having someone wash your back for you may be helpful. Wait 2 minutes after CHG soap is applied, then you may rinse off the CHG soap.  Pat dry with a clean towel  Put on clean clothes/pajamas   If you choose to wear lotion, please use ONLY the CHG-compatible lotions on the back of this paper.     Additional instructions for the day of surgery: DO NOT APPLY any lotions, deodorants, cologne, or perfumes.   Put on clean/comfortable clothes.  Brush your teeth.  Ask your nurse before applying any prescription medications to the skin.      CHG Compatible Lotions  Aveeno Moisturizing lotion  Cetaphil Moisturizing Cream  Cetaphil Moisturizing Lotion  Clairol Herbal Essence Moisturizing Lotion, Dry Skin  Clairol Herbal Essence Moisturizing Lotion, Extra Dry Skin  Clairol Herbal Essence Moisturizing Lotion, Normal Skin  Curel Age Defying Therapeutic Moisturizing Lotion with Alpha Hydroxy  Curel Extreme Care Body Lotion  Curel Soothing Hands Moisturizing Hand Lotion  Curel Therapeutic Moisturizing Cream, Fragrance-Free  Curel Therapeutic Moisturizing Lotion, Fragrance-Free  Curel Therapeutic Moisturizing Lotion, Original Formula  Eucerin Daily Replenishing Lotion  Eucerin Dry Skin Therapy Plus Alpha Hydroxy Crme  Eucerin Dry Skin Therapy Plus Alpha Hydroxy Lotion  Eucerin Original Crme  Eucerin Original Lotion  Eucerin Plus Crme Eucerin Plus Lotion  Eucerin TriLipid Replenishing Lotion  Keri Anti-Bacterial Hand Lotion  Keri Deep Conditioning Original Lotion Dry Skin Formula Softly Scented  Keri Deep Conditioning Original Lotion, Fragrance Free Sensitive Skin Formula  Keri Lotion Fast Absorbing  Fragrance Free Sensitive Skin Formula  Keri Lotion Fast Absorbing Softly Scented Dry Skin Formula  Keri Original Lotion  Keri Skin Renewal Lotion Keri Silky Smooth Lotion  Keri Silky Smooth Sensitive Skin Lotion  Nivea Body Creamy Conditioning Oil  Nivea Body Extra Enriched Lotion  Nivea Body Original Lotion  Nivea Body Sheer Moisturizing Lotion Nivea Crme  Nivea Skin Firming Lotion  NutraDerm 30 Skin Lotion  NutraDerm Skin Lotion  NutraDerm Therapeutic Skin Cream  NutraDerm Therapeutic Skin Lotion  ProShield Protective Hand Cream  Provon moisturizing lotion  How to Use an Incentive Spirometer An incentive spirometer is a tool that measures how well you are filling your lungs with each breath. Learning to take long, deep breaths using this tool can help you keep your lungs clear and active. This may help to reverse or lessen your chance of developing breathing (pulmonary) problems, especially infection. You may be asked to use a spirometer: After a surgery. If you have a lung problem or a history of smoking. After a long period of time when you have been unable to move or be active. If the spirometer includes an indicator to show the highest number that you have reached, your health care provider or respiratory therapist will help you set a goal. Keep a log of your progress as told by your health care provider. What are the risks? Breathing too quickly may cause dizziness or cause you to pass out. Take your time so you do not get dizzy or light-headed. If you are in pain, you may need to take pain medicine before doing incentive spirometry. It is harder to take a deep breath if you are having pain. How to use your incentive spirometer  Sit up on the edge of your bed or on a chair. Hold the incentive spirometer so that it is in an upright position. Before you use the spirometer, breathe out normally. Place the mouthpiece in your mouth. Make sure your lips are closed tightly around  it. Breathe in slowly and as deeply as you can through your mouth, causing the piston or the ball to rise toward the top of the chamber. Hold your breath for 3-5 seconds, or for as long as possible. If the spirometer includes a coach indicator, use this to guide you in breathing. Slow down your breathing if the indicator goes above the marked areas. Remove the mouthpiece from your mouth and breathe out normally. The piston or ball will return to the bottom of the chamber. Rest for a few seconds, then repeat the steps 10 or more times. Take your time and take  a few normal breaths between deep breaths so that you do not get dizzy or light-headed. Do this every 1-2 hours when you are awake. If the spirometer includes a goal marker to show the highest number you have reached (best effort), use this as a goal to work toward during each repetition. After each set of 10 deep breaths, cough a few times. This will help to make sure that your lungs are clear. If you have an incision on your chest or abdomen from surgery, place a pillow or a rolled-up towel firmly against the incision when you cough. This can help to reduce pain while taking deep breaths and coughing. General tips When you are able to get out of bed: Walk around often. Continue to take deep breaths and cough in order to clear your lungs. Keep using the incentive spirometer until your health care provider says it is okay to stop using it. If you have been in the hospital, you may be told to keep using the spirometer at home. Contact a health care provider if: You are having difficulty using the spirometer. You have trouble using the spirometer as often as instructed. Your pain medicine is not giving enough relief for you to use the spirometer as told. You have a fever. Get help right away if: You develop shortness of breath. You develop a cough with bloody mucus from the lungs. You have fluid or blood coming from an incision site after  you cough. Summary An incentive spirometer is a tool that can help you learn to take long, deep breaths to keep your lungs clear and active. You may be asked to use a spirometer after a surgery, if you have a lung problem or a history of smoking, or if you have been inactive for a long period of time. Use your incentive spirometer as instructed every 1-2 hours while you are awake. If you have an incision on your chest or abdomen, place a pillow or a rolled-up towel firmly against your incision when you cough. This will help to reduce pain. Get help right away if you have shortness of breath, you cough up bloody mucus, or blood comes from your incision when you cough. This information is not intended to replace advice given to you by your health care provider. Make sure you discuss any questions you have with your health care provider. Document Revised: 04/22/2023 Document Reviewed: 04/22/2023 Elsevier Patient Education  2024 [employer].   [employer] Directory to address health-related social needs:  [email or web address]

## 2024-04-20 NOTE — Telephone Encounter (Signed)
 Family medical supply is needing additional information in order to process the order for the hospital bed through insurance. I spoke with Harlene at the [facility] location and she states that they need a face sheet, office notes, and also documentation of medical necessity to support the need for a hospital bed. This information can be emailed to [email or web address]

## 2024-04-20 NOTE — Telephone Encounter (Signed)
 Letter, face sheet, order, and last 2 office notes have been sent to [employer].

## 2024-04-24 ENCOUNTER — Encounter: Payer: Self-pay | Admitting: Neurosurgery

## 2024-04-25 ENCOUNTER — Ambulatory Visit: Payer: Self-pay | Admitting: Urgent Care

## 2024-04-25 ENCOUNTER — Encounter
Admission: RE | Disposition: A | Discharge status: Home / Self Care | Payer: Self-pay | Source: Home / Self Care | Attending: Neurosurgery

## 2024-04-25 ENCOUNTER — Other Ambulatory Visit: Payer: Self-pay

## 2024-04-25 ENCOUNTER — Ambulatory Visit

## 2024-04-25 ENCOUNTER — Ambulatory Visit
Admission: RE | Admit: 2024-04-25 | Discharge: 2024-04-25 | Disposition: A | Discharge status: Home / Self Care | Attending: Neurosurgery | Admitting: Neurosurgery

## 2024-04-25 ENCOUNTER — Encounter: Payer: Self-pay | Admitting: Neurosurgery

## 2024-04-25 DIAGNOSIS — M5416 Radiculopathy, lumbar region: Secondary | ICD-10-CM | POA: Diagnosis present

## 2024-04-25 DIAGNOSIS — Z8711 Personal history of peptic ulcer disease: Secondary | ICD-10-CM | POA: Insufficient documentation

## 2024-04-25 DIAGNOSIS — E039 Hypothyroidism, unspecified: Secondary | ICD-10-CM | POA: Insufficient documentation

## 2024-04-25 DIAGNOSIS — F32A Depression, unspecified: Secondary | ICD-10-CM | POA: Insufficient documentation

## 2024-04-25 DIAGNOSIS — F419 Anxiety disorder, unspecified: Secondary | ICD-10-CM | POA: Insufficient documentation

## 2024-04-25 DIAGNOSIS — Z794 Long term (current) use of insulin: Secondary | ICD-10-CM | POA: Diagnosis not present

## 2024-04-25 DIAGNOSIS — Z01818 Encounter for other preprocedural examination: Secondary | ICD-10-CM

## 2024-04-25 DIAGNOSIS — M5116 Intervertebral disc disorders with radiculopathy, lumbar region: Secondary | ICD-10-CM | POA: Diagnosis not present

## 2024-04-25 DIAGNOSIS — Z87891 Personal history of nicotine dependence: Secondary | ICD-10-CM | POA: Diagnosis not present

## 2024-04-25 DIAGNOSIS — E66813 Obesity, class 3: Secondary | ICD-10-CM | POA: Diagnosis not present

## 2024-04-25 DIAGNOSIS — Z7984 Long term (current) use of oral hypoglycemic drugs: Secondary | ICD-10-CM | POA: Diagnosis not present

## 2024-04-25 DIAGNOSIS — E785 Hyperlipidemia, unspecified: Secondary | ICD-10-CM | POA: Insufficient documentation

## 2024-04-25 DIAGNOSIS — R29898 Other symptoms and signs involving the musculoskeletal system: Secondary | ICD-10-CM

## 2024-04-25 DIAGNOSIS — Z79899 Other long term (current) drug therapy: Secondary | ICD-10-CM | POA: Insufficient documentation

## 2024-04-25 DIAGNOSIS — Z6841 Body Mass Index (BMI) 40.0 and over, adult: Secondary | ICD-10-CM | POA: Insufficient documentation

## 2024-04-25 DIAGNOSIS — I119 Hypertensive heart disease without heart failure: Secondary | ICD-10-CM | POA: Diagnosis not present

## 2024-04-25 DIAGNOSIS — I2510 Atherosclerotic heart disease of native coronary artery without angina pectoris: Secondary | ICD-10-CM | POA: Diagnosis not present

## 2024-04-25 DIAGNOSIS — E119 Type 2 diabetes mellitus without complications: Secondary | ICD-10-CM | POA: Diagnosis not present

## 2024-04-25 DIAGNOSIS — K219 Gastro-esophageal reflux disease without esophagitis: Secondary | ICD-10-CM | POA: Insufficient documentation

## 2024-04-25 DIAGNOSIS — M48061 Spinal stenosis, lumbar region without neurogenic claudication: Secondary | ICD-10-CM | POA: Diagnosis not present

## 2024-04-25 HISTORY — DX: Other cerebrovascular disease: I67.89

## 2024-04-25 HISTORY — DX: Other specified postprocedural states: Z98.890

## 2024-04-25 HISTORY — DX: Atherosclerosis of aorta: I70.0

## 2024-04-25 HISTORY — DX: Plantar fascial fibromatosis: M72.2

## 2024-04-25 HISTORY — PX: LUMBAR LAMINECTOMY/DECOMPRESSION MICRODISCECTOMY: SHX5026

## 2024-04-25 LAB — GLUCOSE, CAPILLARY
Glucose-Capillary: 125 mg/dL — ABNORMAL HIGH (ref 70–99)
Glucose-Capillary: 131 mg/dL — ABNORMAL HIGH (ref 70–99)

## 2024-04-25 SURGERY — LUMBAR LAMINECTOMY/DECOMPRESSION MICRODISCECTOMY 1 LEVEL
Anesthesia: General | Laterality: Right

## 2024-04-25 MED ORDER — LIDOCAINE HCL (CARDIAC) PF 100 MG/5ML IV SOSY
PREFILLED_SYRINGE | INTRAVENOUS | Status: DC | PRN
  Administered 2024-04-25: 100 mg via INTRAVENOUS

## 2024-04-25 MED ORDER — HYDROCODONE-ACETAMINOPHEN 5-325 MG PO TABS
1.0000 | ORAL_TABLET | ORAL | 0 refills | Status: AC | PRN
  Filled 2024-04-25: qty 45, 4d supply, fill #0

## 2024-04-25 MED ORDER — ONDANSETRON HCL 4 MG/2ML IJ SOLN
INTRAMUSCULAR | Status: DC | PRN
Start: 2024-04-25 — End: 2024-04-25
  Administered 2024-04-25: 4 mg via INTRAVENOUS

## 2024-04-25 MED ORDER — MIDAZOLAM HCL (PF) 2 MG/2ML IJ SOLN
INTRAMUSCULAR | Status: DC | PRN
  Administered 2024-04-25: 2 mg via INTRAVENOUS

## 2024-04-25 MED ORDER — PROPOFOL 1000 MG/100ML IV EMUL
INTRAVENOUS | Status: AC
  Filled 2024-04-25: qty 100

## 2024-04-25 MED ORDER — FENTANYL CITRATE (PF) 100 MCG/2ML IJ SOLN
25.0000 ug | INTRAMUSCULAR | Status: DC | PRN
  Administered 2024-04-25: 50 ug via INTRAVENOUS
  Administered 2024-04-25 (×2): 25 ug via INTRAVENOUS

## 2024-04-25 MED ORDER — METHYLPREDNISOLONE ACETATE 40 MG/ML IJ SUSP
INTRAMUSCULAR | Status: AC
Start: 2024-04-25 — End: 2024-04-25
  Filled 2024-04-25: qty 1

## 2024-04-25 MED ORDER — BUPIVACAINE-EPINEPHRINE (PF) 0.5% -1:200000 IJ SOLN
INTRAMUSCULAR | Status: AC
  Filled 2024-04-25: qty 10

## 2024-04-25 MED ORDER — ORAL CARE MOUTH RINSE: 15.0000 mL | Freq: Once | OROMUCOSAL | Status: AC

## 2024-04-25 MED ORDER — ONDANSETRON HCL 4 MG/2ML IJ SOLN: 4.0000 mg | Freq: Once | INTRAMUSCULAR | Status: DC | PRN

## 2024-04-25 MED ORDER — 0.9 % SODIUM CHLORIDE (POUR BTL) OPTIME
TOPICAL | Status: DC | PRN
  Administered 2024-04-25: 500 mL

## 2024-04-25 MED ORDER — METHOCARBAMOL 500 MG PO TABS
500.0000 mg | ORAL_TABLET | Freq: Four times a day (QID) | ORAL | 0 refills | Status: DC | PRN
  Filled 2024-04-25: qty 120, 30d supply, fill #0

## 2024-04-25 MED ORDER — FENTANYL CITRATE (PF) 100 MCG/2ML IJ SOLN
INTRAMUSCULAR | Status: DC | PRN
  Administered 2024-04-25 (×2): 50 ug via INTRAVENOUS

## 2024-04-25 MED ORDER — BUPIVACAINE HCL (PF) 0.5 % IJ SOLN
INTRAMUSCULAR | Status: DC | PRN
  Administered 2024-04-25: 60 mL via INTRAMUSCULAR

## 2024-04-25 MED ORDER — PROPOFOL 10 MG/ML IV BOLUS
INTRAVENOUS | Status: DC | PRN
  Administered 2024-04-25: 150 mg via INTRAVENOUS

## 2024-04-25 MED ORDER — ACETAMINOPHEN 10 MG/ML IV SOLN: 1000.0000 mg | Freq: Once | INTRAVENOUS | Status: DC | PRN

## 2024-04-25 MED ORDER — SODIUM CHLORIDE 0.9 % IV SOLN: INTRAVENOUS | Status: DC

## 2024-04-25 MED ORDER — KETAMINE HCL 50 MG/5ML IJ SOSY
PREFILLED_SYRINGE | INTRAMUSCULAR | Status: AC
Start: 2024-04-25 — End: 2024-04-25
  Filled 2024-04-25: qty 5

## 2024-04-25 MED ORDER — CHLORHEXIDINE GLUCONATE 0.12 % MT SOLN
15.0000 mL | Freq: Once | OROMUCOSAL | Status: AC
  Administered 2024-04-25: 15 mL via OROMUCOSAL

## 2024-04-25 MED ORDER — BUPIVACAINE LIPOSOME 1.3 % IJ SUSP
INTRAMUSCULAR | Status: AC
Start: 2024-04-25 — End: 2024-04-25
  Filled 2024-04-25: qty 20

## 2024-04-25 MED ORDER — PROPOFOL 500 MG/50ML IV EMUL
INTRAVENOUS | Status: DC | PRN
  Administered 2024-04-25: 125 ug/kg/min via INTRAVENOUS

## 2024-04-25 MED ORDER — SODIUM CHLORIDE (PF) 0.9 % IJ SOLN
INTRAMUSCULAR | Status: AC
  Filled 2024-04-25: qty 20

## 2024-04-25 MED ORDER — SENNA 8.6 MG PO TABS
1.0000 | ORAL_TABLET | Freq: Two times a day (BID) | ORAL | 0 refills | Status: AC | PRN
  Filled 2024-04-25: qty 30, 15d supply, fill #0

## 2024-04-25 MED ORDER — TRAMADOL HCL 50 MG PO TABS
100.0000 mg | ORAL_TABLET | Freq: Once | ORAL | Status: AC
  Administered 2024-04-25: 100 mg via ORAL

## 2024-04-25 MED ORDER — DEXMEDETOMIDINE HCL IN NACL 80 MCG/20ML IV SOLN
INTRAVENOUS | Status: DC | PRN
Start: 2024-04-25 — End: 2024-04-25
  Administered 2024-04-25: 8 ug via INTRAVENOUS

## 2024-04-25 MED ORDER — FENTANYL CITRATE (PF) 100 MCG/2ML IJ SOLN
INTRAMUSCULAR | Status: AC
  Filled 2024-04-25: qty 2

## 2024-04-25 MED ORDER — PROPOFOL 10 MG/ML IV BOLUS
INTRAVENOUS | Status: AC
  Filled 2024-04-25: qty 20

## 2024-04-25 MED ORDER — SUCCINYLCHOLINE CHLORIDE 200 MG/10ML IV SOSY
PREFILLED_SYRINGE | INTRAVENOUS | Status: DC | PRN
  Administered 2024-04-25: 100 mg via INTRAVENOUS

## 2024-04-25 MED ORDER — ACETAMINOPHEN 10 MG/ML IV SOLN
INTRAVENOUS | Status: AC
  Filled 2024-04-25: qty 100

## 2024-04-25 MED ORDER — REMIFENTANIL HCL 1 MG IV SOLR
INTRAVENOUS | Status: AC
  Filled 2024-04-25: qty 1000

## 2024-04-25 MED ORDER — PHENYLEPHRINE 80 MCG/ML (10ML) SYRINGE FOR IV PUSH (FOR BLOOD PRESSURE SUPPORT)
PREFILLED_SYRINGE | INTRAVENOUS | Status: DC | PRN
  Administered 2024-04-25 (×3): 80 ug via INTRAVENOUS

## 2024-04-25 MED ORDER — CEFAZOLIN IN SODIUM CHLORIDE 2-0.9 GM/100ML-% IV SOLN
2.0000 g | Freq: Once | INTRAVENOUS | Status: AC
  Administered 2024-04-25: 2 g via INTRAVENOUS

## 2024-04-25 MED ORDER — MIDAZOLAM HCL 2 MG/2ML IJ SOLN
INTRAMUSCULAR | Status: AC
  Filled 2024-04-25: qty 2

## 2024-04-25 MED ORDER — GLYCOPYRROLATE 0.2 MG/ML IJ SOLN
INTRAMUSCULAR | Status: DC | PRN
  Administered 2024-04-25: .1 mg via INTRAVENOUS

## 2024-04-25 MED ORDER — SURGIFLO WITH THROMBIN (HEMOSTATIC MATRIX KIT) OPTIME
TOPICAL | Status: DC | PRN
  Administered 2024-04-25: 1 via TOPICAL

## 2024-04-25 MED ORDER — METHYLPREDNISOLONE ACETATE 40 MG/ML IJ SUSP
INTRAMUSCULAR | Status: DC | PRN
  Administered 2024-04-25: 40 mg

## 2024-04-25 MED ORDER — BUPIVACAINE HCL (PF) 0.5 % IJ SOLN
INTRAMUSCULAR | Status: AC
  Filled 2024-04-25: qty 30

## 2024-04-25 MED ORDER — LACTATED RINGERS IV SOLN: INTRAVENOUS | Status: DC

## 2024-04-25 MED ORDER — TRAMADOL HCL 50 MG PO TABS
ORAL_TABLET | ORAL | Status: AC
  Filled 2024-04-25: qty 2

## 2024-04-25 MED ORDER — CHLORHEXIDINE GLUCONATE 0.12 % MT SOLN
OROMUCOSAL | Status: AC
Start: 2024-04-25 — End: 2024-04-25
  Filled 2024-04-25: qty 15

## 2024-04-25 MED ORDER — DEXAMETHASONE SOD PHOSPHATE PF 10 MG/ML IJ SOLN
INTRAMUSCULAR | Status: DC | PRN
  Administered 2024-04-25: 10 mg via INTRAVENOUS

## 2024-04-25 MED ORDER — POLYETHYLENE GLYCOL 3350 17 GM/SCOOP PO POWD
17.0000 g | Freq: Every day | ORAL | 0 refills | Status: AC | PRN
  Filled 2024-04-25: qty 238, 14d supply, fill #0

## 2024-04-25 MED ORDER — REMIFENTANIL HCL 1 MG IV SOLR
INTRAVENOUS | Status: DC | PRN
  Administered 2024-04-25: .15 ug/kg/min via INTRAVENOUS

## 2024-04-25 MED ORDER — BUPIVACAINE-EPINEPHRINE (PF) 0.5% -1:200000 IJ SOLN
INTRAMUSCULAR | Status: DC | PRN
  Administered 2024-04-25: 10 mL

## 2024-04-25 MED ORDER — ACETAMINOPHEN 10 MG/ML IV SOLN
INTRAVENOUS | Status: DC | PRN
  Administered 2024-04-25: 1000 mg via INTRAVENOUS

## 2024-04-25 MED ORDER — KETAMINE HCL 50 MG/5ML IJ SOSY
PREFILLED_SYRINGE | INTRAMUSCULAR | Status: DC | PRN
  Administered 2024-04-25: 50 mg via INTRAVENOUS

## 2024-04-25 MED ORDER — CEFAZOLIN SODIUM-DEXTROSE 2-4 GM/100ML-% IV SOLN
INTRAVENOUS | Status: AC
  Filled 2024-04-25: qty 100

## 2024-04-25 SURGICAL SUPPLY — 27 items
BASIN KIT SINGLE STR (MISCELLANEOUS) ×1 IMPLANT
BUR NEURO DRILL SOFT 3.0X3.8M (BURR) ×1 IMPLANT
DERMABOND ADVANCED .7 DNX12 (GAUZE/BANDAGES/DRESSINGS) ×1 IMPLANT
DRAPE C ARM PK CFD 31 SPINE (DRAPES) ×1 IMPLANT
DRAPE LAPAROTOMY 100X77 ABD (DRAPES) ×1 IMPLANT
DRAPE SPINE LEICA/WILD 54X150 (DRAPES) ×1 IMPLANT
DRSG OPSITE POSTOP 3X4 (GAUZE/BANDAGES/DRESSINGS) ×1 IMPLANT
ELECTRODE EZSTD 165MM 6.5IN (MISCELLANEOUS) ×1 IMPLANT
ELECTRODE REM PT RTRN 9FT ADLT (ELECTROSURGICAL) ×1 IMPLANT
GLOVE BIOGEL PI IND STRL 6.5 (GLOVE) ×1 IMPLANT
GLOVE SURG SYN 6.5 PF PI (GLOVE) ×1 IMPLANT
GLOVE SURG SYN 8.5 PF PI (GLOVE) ×3 IMPLANT
GOWN SRG LRG LVL 4 IMPRV REINF (GOWNS) ×1 IMPLANT
GOWN SRG XL LVL 3 NONREINFORCE (GOWNS) ×1 IMPLANT
KIT SPINAL PRONEVIEW (KITS) ×1 IMPLANT
MANIFOLD NEPTUNE II (INSTRUMENTS) ×1 IMPLANT
NDL SAFETY ECLIP 18X1.5 (MISCELLANEOUS) ×1 IMPLANT
NS IRRIG 500ML POUR BTL (IV SOLUTION) ×1 IMPLANT
PACK LAMINECTOMY ARMC (PACKS) ×1 IMPLANT
PAD ARMBOARD POSITIONER FOAM (MISCELLANEOUS) ×1 IMPLANT
SURGIFLO W/THROMBIN 8M KIT (HEMOSTASIS) ×1 IMPLANT
SUT STRATA 3-0 15 PS-2 (SUTURE) ×1 IMPLANT
SUT VIC AB 0 CT1 27XCR 8 STRN (SUTURE) ×1 IMPLANT
SUT VIC AB 2-0 CT1 18 (SUTURE) ×1 IMPLANT
SYR 30ML LL (SYRINGE) ×2 IMPLANT
SYR 3ML LL SCALE MARK (SYRINGE) ×1 IMPLANT
TRAP FLUID SMOKE EVACUATOR (MISCELLANEOUS) ×1 IMPLANT

## 2024-04-25 NOTE — Interval H&P Note (Signed)
 History and Physical Interval Note:  04/25/2024 11:39 AM  [name]  has presented today for surgery, with the diagnosis of M54.16 lumbar radiculopathy R29.898 right leg weakness.  The various methods of treatment have been discussed with the patient and family. After consideration of risks, benefits and other options for treatment, the patient has consented to  Procedure(s) with comments: LUMBAR LAMINECTOMY/DECOMPRESSION MICRODISCECTOMY 1 LEVEL (Right) - RIGHT L4/5 MICRODISCECTOMY as a surgical intervention.  The patient's history has been reviewed, patient examined, no change in status, stable for surgery.  I have reviewed the patient's chart and labs.  Questions were answered to the patient's satisfaction.    Heart sounds normal no MRG. Chest Clear to Auscultation Bilaterally.  Aryonna Gunnerson

## 2024-04-25 NOTE — Anesthesia Postprocedure Evaluation (Signed)
 Anesthesia Post Note  Patient: [name]  Procedure(s) Performed: LUMBAR LAMINECTOMY/DECOMPRESSION MICRODISCECTOMY 1 LEVEL (Right)  Patient location during evaluation: PACU Anesthesia Type: General Level of consciousness: awake and alert, oriented and patient cooperative Pain management: pain level controlled Vital Signs Assessment: post-procedure vital signs reviewed and stable Respiratory status: spontaneous breathing, nonlabored ventilation and respiratory function stable Cardiovascular status: blood pressure returned to baseline and stable Postop Assessment: adequate PO intake Anesthetic complications: no   No notable events documented.   Last Vitals:  Vitals:   04/25/24 1415 04/25/24 1430  BP: (!) 107/56 (!) 147/62  Pulse: 87 85  Resp: 18 20  Temp:    SpO2: 96% 98%    Last Pain:  Vitals:   04/25/24 1500  TempSrc:   PainSc: 7                  Alfonso Ruths

## 2024-04-25 NOTE — Discharge Instructions (Addendum)

## 2024-04-25 NOTE — Transfer of Care (Signed)
 Immediate Anesthesia Transfer of Care Note  Patient: [name]  Procedure(s) Performed: LUMBAR LAMINECTOMY/DECOMPRESSION MICRODISCECTOMY 1 LEVEL (Right)  Patient Location: PACU  Anesthesia Type:General  Level of Consciousness: awake, alert , oriented, and patient cooperative  Airway & Oxygen Therapy: Patient Spontanous Breathing and Patient connected to face mask oxygen  Post-op Assessment: Report given to RN, Post -op Vital signs reviewed and stable, and Patient moving all extremities X 4  Post vital signs: Reviewed and stable  Last Vitals:  Vitals Value Taken Time  BP 126/92 04/25/24 14:07  Temp    Pulse 84 04/25/24 14:08  Resp 19 04/25/24 14:08  SpO2 100 % 04/25/24 14:08  Vitals shown include unfiled device data.  Last Pain:  Vitals:   04/25/24 1131  TempSrc: Temporal  PainSc: 7          Complications: No notable events documented.

## 2024-04-25 NOTE — Anesthesia Preprocedure Evaluation (Signed)
 Anesthesia Evaluation  Patient identified by MRN, date of birth, ID band Patient awake    Reviewed: Allergy & Precautions, NPO status , Patient's Chart, lab work & pertinent test results  History of Anesthesia Complications (+) PONV, PROLONGED EMERGENCE and history of anesthetic complications  Airway Mallampati: IV   Neck ROM: Full    Dental  (+) Missing   Pulmonary asthma , former smoker (quit 2002)   Pulmonary exam normal breath sounds clear to auscultation       Cardiovascular hypertension, + CAD (nonobstructive)  Normal cardiovascular exam Rhythm:Regular Rate:Normal  ECG 04/20/24: normal  Echo 06/06/23:  NORMAL LEFT VENTRICULAR SYSTOLIC FUNCTION WITH NO LVH  ESTIMATED EF: 50%  NORMAL LA PRESSURES WITH DIASTOLIC DYSFUNCTION (GRADE 1)  NORMAL RIGHT VENTRICULAR SYSTOLIC FUNCTION  VALVULAR REGURGITATION: No AR, TRIVIAL MR, TRIVIAL PR, TRIVIAL TR  NO VALVULAR STENOSIS     Neuro/Psych  Headaches PSYCHIATRIC DISORDERS Anxiety Depression    Essential tremor  Neuromuscular disease (Parkinson disease)    GI/Hepatic PUD,GERD  ,,  Endo/Other  diabetes, Type 2, Insulin  DependentHypothyroidism  Class 3 obesity  Renal/GU negative Renal ROS     Musculoskeletal  (+) Arthritis ,  Fibromyalgia -  Abdominal   Peds  Hematology  (+) Blood dyscrasia, anemia   Anesthesia Other Findings Cardiology note 04/23/24:  68 y.o. female with  1. Coronary artery disease involving native coronary artery of native heart without angina pectoris  2. Heart palpitations  3. Primary hypertension  4. SOB (shortness of breath)  5. Diabetes mellitus without complication (CMS/HHS-HCC)  6. Hyperlipidemia, mixed  7. Type 2 diabetes mellitus with hyperglycemia, with long-term current use of insulin  (CMS/HHS-HCC)  8. Statin myopathy   68 year old female with known normal coronary anatomy by cardiac catheterization 04/04/2012, and nonobstructive CAD  by coronary CTA 01/15/2019, without chest pain, with mild chronic exertional dyspnea. Patient has essential hypertension, blood pressure well controlled on current BP medications. The patient should be at low, and acceptable risk for L4/5 microdiscectomy.  Plan   1. Continue current medications 2. Counseled patient about low sodium diet 3. DASH diet print instructions given to the patient 4. Counseled patient about low-cholesterol diet 5. Low-fat and cholesterol diet printed instructions given to the patient 6. Proceed with L4/5 microdiscectomy 7. Defer further cardiac diagnostics 8. Return to clinic for follow-up in 6 months  No orders of the defined types were placed in this encounter.  Return in about 6 months (around 10/22/2024).    Reproductive/Obstetrics                              Anesthesia Physical Anesthesia Plan  ASA: 3  Anesthesia Plan: General   Post-op Pain Management:    Induction: Intravenous  PONV Risk Score and Plan: 4 or greater and Ondansetron , Dexamethasone, Treatment may vary due to age or medical condition and TIVA  Airway Management Planned: Oral ETT  Additional Equipment:   Intra-op Plan:   Post-operative Plan: Extubation in OR  Informed Consent: I have reviewed the patients History and Physical, chart, labs and discussed the procedure including the risks, benefits and alternatives for the proposed anesthesia with the patient or authorized representative who has indicated his/her understanding and acceptance.     Dental advisory given  Plan Discussed with: CRNA  Anesthesia Plan Comments: (Patient consented for risks of anesthesia including but not limited to:  - adverse reactions to medications - damage to eyes, teeth, lips or other  oral mucosa - nerve damage due to positioning  - sore throat or hoarseness - damage to heart, brain, nerves, lungs, other parts of body or loss of life  Informed patient about role of  CRNA in peri- and intra-operative care.  Patient voiced understanding.)         Anesthesia Quick Evaluation

## 2024-04-25 NOTE — Progress Notes (Signed)
 Edsel Goods PA spoke to patient at bedside in pacu. Of noted:  patient incont of urine in bed while in pacu: large amount. Patient tolerated po fluids and crackers while in pacu.

## 2024-04-25 NOTE — Op Note (Signed)
 Indications: [name] is suffering from M54.16 lumbar radiculopathy, R29.898 right leg weakness.   Findings: M54.16 lumbar radiculopathy, R29.898 right leg weakness   Preoperative Diagnosis: M54.16 lumbar radiculopathy, R29.898 right leg weakness  Postoperative Diagnosis: same   EBL: 20 ml IVF: see anesthesia record Drains: none Disposition: Extubated and Stable to PACU Complications: none  No foley catheter was placed.   Preoperative Note:   Risks of surgery discussed include: infection, bleeding, stroke, coma, death, paralysis, CSF leak, nerve/spinal cord injury, numbness, tingling, weakness, complex regional pain syndrome, recurrent stenosis and/or disc herniation, vascular injury, development of instability, neck/back pain, need for further surgery, persistent symptoms, development of deformity, and the risks of anesthesia. The patient understood these risks and agreed to proceed.  Operative Note:   1) Right L4/5 microdiscectomy  The patient was then brought from the preoperative center with intravenous access established.  The patient underwent general anesthesia and endotracheal tube intubation, and was then rotated on the [place] rail top where all pressure points were appropriately padded.  The skin was then thoroughly cleansed.  Perioperative antibiotic prophylaxis was administered.  Sterile prep and drapes were then applied and a timeout was then observed.  C-arm was brought into the field under sterile conditions, and the L4-5 disc space identified and marked with an incision on the right 1cm lateral to midline.  Once this was complete a 4 cm incision was opened with the use of a #10 blade knife.  The Metrx tubes were sequentially advanced under lateral fluoroscopy until a 18 x 70 mm Metrx tube was placed over the facet and lamina and secured to the bed.    The microscope was then sterilely brought into the field and muscle creep was hemostased with a bipolar and resected  with a pituitary rongeur.  A Bovie extender was then used to expose the spinous process and lamina.  Careful attention was placed to not violate the facet capsule. A 3 mm matchstick drill bit was then used to make a hemi-laminotomy trough until the ligamentum flavum was exposed.  This was extended to the base of the spinous process.  Once this was complete and the underlying ligamentum flavum was visualized, the ligamentum was dissected with an up angle curette and resected with a #2 and #3 mm biting Kerrison.  The laminotomy opening was also expanded in similar fashion and hemostasis was obtained with Surgifoam and a patty as well as bone wax.  The rostral aspect of the caudal level of the lamina was also resected with a #2 biting Kerrison effort to further enhance exposure.  Once the underlying dura was visualized a Penfield 4 was then used to dissect and expose the traversing nerve root.  Once this was identified a nerve root retractor suction was used to mobilize this medially.  The venous plexus was hemostased with Surgifoam and light bipolar use.  A small penfied was then used to make a small annulotomy within the disc space and disc space contents were noted to come through the annulus.    The disc herniation was identified and dissected free using a balltip probe. The pituitary rongeur was used to remove the extruded disc fragments. Once the thecal sac and nerve root were noted to be relaxed and under less tension the ball-tipped feeler was passed along the foramen distally to ensure no residual compression was noted.    Depo-Medrol was placed along the nerve root.  The area was irrigated. The tube system was then removed under microscopic  visualization and hemostasis was obtained with a bipolar.    The fascial layer was reapproximated with the use of a 0- Vicryl suture.  Subcutaneous tissue layer was reapproximated using 2-0 Vicryl suture.  3-0 monocryl was used on the skin. The skin was then cleansed  and Dermabond was used to close the skin opening.  Patient was then rotated back to the preoperative bed awakened from anesthesia and taken to recovery all counts are correct in this case.   I performed the entire procedure with the assistance of Edsel Goods PA as an designer, television/film set. An assistant was required for this procedure due to the complexity.  The assistant provided assistance in tissue manipulation and suction, and was required for the successful and safe performance of the procedure. I performed the critical portions of the procedure.   Reeves Daisy MD

## 2024-04-25 NOTE — Anesthesia Procedure Notes (Signed)
 Procedure Name: Intubation Date/Time: 04/25/2024 12:25 PM  Performed by: Myra Lawless, CRNAPre-anesthesia Checklist: Patient identified, Patient being monitored, Timeout performed, Emergency Drugs available and Suction available Patient Re-evaluated:Patient Re-evaluated prior to induction Oxygen Delivery Method: Circle system utilized Preoxygenation: Pre-oxygenation with 100% oxygen Induction Type: IV induction Ventilation: Mask ventilation without difficulty Laryngoscope Size: Mac and 4 Grade View: Grade I Tube type: Oral Tube size: 7.0 mm Number of attempts: 1 Airway Equipment and Method: Stylet Placement Confirmation: ETT inserted through vocal cords under direct vision, positive ETCO2 and breath sounds checked- equal and bilateral Secured at: 21 cm Tube secured with: Tape Dental Injury: Teeth and Oropharynx as per pre-operative assessment

## 2024-04-25 NOTE — Progress Notes (Signed)
 Patient up at bedside, stood without event, used walker with one assist. Ambulated approx 20 feet, right leg buckled Patient asking to be admitted overnight Will make Dr. Clois aware.

## 2024-04-26 ENCOUNTER — Encounter: Payer: Self-pay | Admitting: Neurosurgery

## 2024-05-01 ENCOUNTER — Telehealth: Payer: Self-pay | Admitting: Orthopedic Surgery

## 2024-05-01 ENCOUNTER — Other Ambulatory Visit

## 2024-05-01 DIAGNOSIS — M7989 Other specified soft tissue disorders: Secondary | ICD-10-CM

## 2024-05-01 DIAGNOSIS — Z9889 Other specified postprocedural states: Secondary | ICD-10-CM

## 2024-05-01 MED ORDER — HYDROCODONE-ACETAMINOPHEN 10-325 MG PO TABS: 1.0000 | ORAL_TABLET | ORAL | 0 refills | Status: DC | PRN

## 2024-05-01 MED ORDER — METHYLPREDNISOLONE 4 MG PO TBPK: ORAL_TABLET | ORAL | 0 refills | Status: DC

## 2024-05-01 NOTE — Telephone Encounter (Signed)
 Spoke with patient. She has history of swelling in legs, but this is much worse. Advised her to follow up with PCP about this, but will also order stat doppler of bilateral legs to rule out a DVT.   Medrol dose pack sent to pharmacy along with norco 10. This was discussed with patient.   PMP reviewed and is appropriate.

## 2024-05-01 NOTE — Telephone Encounter (Signed)
 Right L4-L5 microdiscectomy on 04/25/24.   For leg swelling, is she back on her spironolactone? Does she typically have swelling? It is entire leg or knee down into feet. Any calf pain?   As for her increased pain, it is no uncommon to have more pain after surgery due to swelling and inflammation around the nerve.   Recommend steroid dose pack to help with this. This would be similar to prednisone that she was given in ER earlier this month.   Can increase pain medication to the hydrocodone  10 mg and have her take q 4 hours as needed.

## 2024-05-01 NOTE — Telephone Encounter (Signed)
 Date11/04/25 Provider [facility] Requesting Hydrocodone  10mg   [facility]   Patient contact [phone]   & Pt has concerns about both legs being numb/swollen. Right leg in pain & is in as much pain as it was previous to surgery.

## 2024-05-01 NOTE — Telephone Encounter (Signed)
 Patient is agreeable to the steroid dose pack and advised about Hydrocodone  been increased to 10 mg.  Patient states she is back on Spironolactone 25 mg 1 tablet daily. She does have some swelling usually but it is worse since the surgery. Knee down into her feet-both sides-states feet look like she is pregnant with her feet swollen like that. She does have Right calf pain no calf pain in the Left, there is no redness, no fever, no chest pain or shortness of breath. Right calf pain is not new, she had this before surgery and having same intensity now still. Swelling stays the same over night and when she wakes up.  [facility]

## 2024-05-01 NOTE — Addendum Note (Signed)
 Addended byBETHA HILMA HASTINGS on: 05/01/2024 04:18 PM   Modules accepted: Orders

## 2024-05-01 NOTE — Telephone Encounter (Signed)
 Called patient to schedule stat DVT ultrasounds. Kendelyn simultaneously called US  department to coordinate appointment. Patient unavailable to go this evening, but agrees to appointment tomorrow at 1pm. Advised patient to go to [facility] and go to the front entrance by the statue and let the desk know that she is there for imaging. Advised patient to call 911 or go to the ER if she develops worsening symptoms, difficulty breathing, extreme fatigue, or AMS. Patient agreeable at this time.

## 2024-05-01 NOTE — Telephone Encounter (Signed)
 Spoke with patient. Patient states her pain level is the same as it was before the surgery. Pain does not stop, she is not able to sleep at night but maybe 2 hours at a time. Her pain is in the right buttocks down to the right leg and radiates all the way to her toes on the right, pain radiates in the front of her leg. She has numbness in her right leg all the way to her toes-she was having some numbness before the surgery but not like it is now. Both legs- whole legs- feel and look swollen, she had some swelling in legs before the surgery but it is worse some since surgery. Legs do not look red or bruised, maybe feel a little warm to the touch to her but states it may be just her. She does try to elevate her legs but the swelling does not improve. When she wakes up in the morning swelling is the same as it was when she goes to sleep. She is not able to lift her legs to go to sleep, someone has to help her and lift them to go to sleep. Before the surgery she was able to lift her legs into the bed by herself better. She does not have any urinary or bowel incontinence, no perianal numbness. She is questioning why she is still in so much pain after the surgery. She has been taking Hydrocodone  5-325 mg but 1 tablet was not helping even a little so she has been taking 2 tablets every 4 hours and would like to ask for a higher dose of Hydrocodone , the 2 tablets help to take the edge of tiny bit but nothing stops the pain or relieves well enough for her to rest. She is also taking Gabapentin  300 mg 1 capsule 3 times daily. Takes Robaxin  500 mg 1 tablet every 6 hours She does not take anything else.

## 2024-05-02 ENCOUNTER — Telehealth: Payer: Self-pay | Admitting: Orthopedic Surgery

## 2024-05-02 ENCOUNTER — Ambulatory Visit
Admission: RE | Admit: 2024-05-02 | Discharge: 2024-05-02 | Disposition: A | Discharge status: Home / Self Care | Source: Ambulatory Visit | Attending: Orthopedic Surgery | Admitting: Orthopedic Surgery

## 2024-05-02 DIAGNOSIS — Z9889 Other specified postprocedural states: Secondary | ICD-10-CM | POA: Insufficient documentation

## 2024-05-02 DIAGNOSIS — M7989 Other specified soft tissue disorders: Secondary | ICD-10-CM | POA: Diagnosis present

## 2024-05-02 NOTE — Telephone Encounter (Signed)
 Spoke with patient and advised of negative US  results of both legs.   She should follow up with PCP and/or cardiology regarding swelling in her legs.   She picked up dose pack and norco 10. Has seen some decrease in her leg pain.   Will call her on Friday to check on her prior to the weekend.

## 2024-05-04 ENCOUNTER — Telehealth: Payer: Self-pay | Admitting: Orthopedic Surgery

## 2024-05-04 NOTE — Telephone Encounter (Signed)
 Please call to check on her. Is pain any better on norco 10 and with steroid dose pack?   Did she call PCP or cardiology about her swelling?

## 2024-05-04 NOTE — Progress Notes (Addendum)
   REFERRING PHYSICIAN:  Valora Lynwood FALCON, Md [address] [place],  [place] [address]  DOS: 04/25/24 Right L4-L5 microdiscectomy  HISTORY OF PRESENT ILLNESS: [name] is approximately 2 weeks status post above surgery. Was given norco 5 and robaxin  on discharge from the hospital.   She called last week with bilateral leg swelling and issues with pain control. She was called in norco 10 along with medrol dose pack.   She had US  of bilateral legs on 05/02/24 that was negative for DVT. She was to follow up with her PCP/cardiology.   She continues with back pain and right leg pain. She has numbness, tingling, and weakness in her right leg. She has not followed up with cardiology or PCP regarding her swelling. She feels like her pain is no better from prior to her surgery. Thinks her right leg pain may be worse.   She finished medrol dose pack and did see some improvement. She is on neurontin  300mg  bid and 600mg  at night, norco 10 prn, robaxin  prn.   PHYSICAL EXAMINATION:  General: Patient is well developed, well nourished, calm, collected, and in no apparent distress.   NEUROLOGICAL:  General: In no acute distress.   Awake, alert, oriented to person, place, and time.  Pupils equal round and reactive to light.  Facial tone is symmetric.     Strength:          Side Iliopsoas Quads Hamstring PF DF EHL  R 5 5 5 5 3 3   L 5 5 5 5 5 5    She has pain with above strength testing in right LE.   Incision c/d/I  She has pitting edema in bilateral lower extremities.    ROS (Neurologic):  Negative except as noted above  IMAGING: Nothing new to review.   ASSESSMENT/PLAN:  [name] is doing fair s/p above surgery. She continues with weakness in right foot- some of this is likely pain  mediated. Treatment options reviewed with patient and following plan made:   - I have advised the patient to lift up to 10 pounds until 6 weeks after surgery (follow up with Dr.  Clois).  - Reviewed wound care.  - No bending, twisting, or lifting.  - Orders placed for HHPT.  - Continue on current medications including prn norco 10. Refill given. PMP reviewed and is appropriate. Continue prn robaxin .  - She is taking neurontin  300mg  bid and 600mg  at night with no side effects. Will go up to neurontin  300mg  in am, 600mg  at lunch, and 600mg  at night. Reviewed side effects.  - Follow up with cardiology regarding bilateral lower extremity swelling.  - Follow up as scheduled in 4 weeks and prn.   Above plan reviewed with Dr. Clois.   Advised to contact the office if any questions or concerns arise.  Destiny Boys PA-C Department of neurosurgery

## 2024-05-04 NOTE — Telephone Encounter (Signed)
 Patient states she is doing better, her pain is about 50% better. Swelling in legs has improved a little. No new symptoms or concerns. She has not called PCP or cardiology yet about her swelling.

## 2024-05-08 ENCOUNTER — Ambulatory Visit: Admitting: Orthopedic Surgery

## 2024-05-08 ENCOUNTER — Encounter: Payer: Self-pay | Admitting: Orthopedic Surgery

## 2024-05-08 VITALS — BP 134/84 | Temp 98.0°F | Ht 63.0 in | Wt 242.0 lb

## 2024-05-08 DIAGNOSIS — M5416 Radiculopathy, lumbar region: Secondary | ICD-10-CM

## 2024-05-08 DIAGNOSIS — Z9889 Other specified postprocedural states: Secondary | ICD-10-CM

## 2024-05-08 MED ORDER — GABAPENTIN 300 MG PO CAPS: ORAL_CAPSULE | ORAL | 0 refills | Status: DC

## 2024-05-08 MED ORDER — HYDROCODONE-ACETAMINOPHEN 10-325 MG PO TABS: 1.0000 | ORAL_TABLET | ORAL | 0 refills | Status: DC | PRN

## 2024-05-08 NOTE — Patient Instructions (Addendum)
 It was nice to see you today.   I am sorry you are not feeling better yet.   Okay to get incision wet in the shower, do not submerge in pool or hot tub. Keep it open to air.   Call if any concerns about the incision such as redness, drainage, or fever/chills.   No bending, twisting, or lifting. You can lift up to 10 pounds until your follow up with Dr.  Clois in 4 weeks.   I sent orders to home health physical therapy and they should be calling you. Let me know if you don't hear anything by Friday.   I sent a refill of norco 10/325 to your pharmacy. Continue to take the least amount needed and only take for severe pain. Remember this medication can make you sleepy and/or constipated.   I sent a refill of gabapentin  to the pharmacy. You should take 300mg  in the morning, 600mg  at lunch, and 600mg  at bed. Side effects include feeling sleepy, dizzy, or drunk. Call me if any issues.   Follow up with cardiology or your PCP about your leg swelling.   We will see you back in 4 weeks for your 6 weeks postop visit.   Please call with any questions or concerns.   Glade Boys PA-C [phone]     The physicians and staff at [facility] at [facility] are committed to providing excellent care. You may receive a survey asking for feedback about your experience at our office. We value you your feedback and appreciate you taking the time to to fill it out. The [facility] leadership team is also available to discuss your experience in person, feel free to contact us  [phone].

## 2024-05-10 ENCOUNTER — Emergency Department

## 2024-05-10 ENCOUNTER — Other Ambulatory Visit: Payer: Self-pay

## 2024-05-10 ENCOUNTER — Encounter: Payer: Self-pay | Admitting: *Deleted

## 2024-05-10 ENCOUNTER — Telehealth: Payer: Self-pay | Admitting: Neurosurgery

## 2024-05-10 ENCOUNTER — Emergency Department
Admission: EM | Admit: 2024-05-10 | Discharge: 2024-05-10 | Disposition: A | Discharge status: Home / Self Care | Attending: Emergency Medicine | Admitting: Emergency Medicine

## 2024-05-10 DIAGNOSIS — M545 Low back pain, unspecified: Secondary | ICD-10-CM | POA: Insufficient documentation

## 2024-05-10 DIAGNOSIS — I2510 Atherosclerotic heart disease of native coronary artery without angina pectoris: Secondary | ICD-10-CM | POA: Insufficient documentation

## 2024-05-10 DIAGNOSIS — Z96652 Presence of left artificial knee joint: Secondary | ICD-10-CM | POA: Diagnosis not present

## 2024-05-10 DIAGNOSIS — R600 Localized edema: Secondary | ICD-10-CM | POA: Diagnosis not present

## 2024-05-10 DIAGNOSIS — E119 Type 2 diabetes mellitus without complications: Secondary | ICD-10-CM | POA: Insufficient documentation

## 2024-05-10 DIAGNOSIS — J45909 Unspecified asthma, uncomplicated: Secondary | ICD-10-CM | POA: Insufficient documentation

## 2024-05-10 DIAGNOSIS — I82401 Acute embolism and thrombosis of unspecified deep veins of right lower extremity: Secondary | ICD-10-CM | POA: Diagnosis not present

## 2024-05-10 DIAGNOSIS — I10 Essential (primary) hypertension: Secondary | ICD-10-CM | POA: Diagnosis not present

## 2024-05-10 DIAGNOSIS — E039 Hypothyroidism, unspecified: Secondary | ICD-10-CM | POA: Diagnosis not present

## 2024-05-10 DIAGNOSIS — I824Z1 Acute embolism and thrombosis of unspecified deep veins of right distal lower extremity: Secondary | ICD-10-CM

## 2024-05-10 DIAGNOSIS — M7989 Other specified soft tissue disorders: Secondary | ICD-10-CM | POA: Diagnosis present

## 2024-05-10 LAB — CBC WITH DIFFERENTIAL/PLATELET
Abs Immature Granulocytes: 0.15 K/uL — ABNORMAL HIGH (ref 0.00–0.07)
Basophils Absolute: 0.1 K/uL (ref 0.0–0.1)
Basophils Relative: 0 %
Eosinophils Absolute: 0.2 K/uL (ref 0.0–0.5)
Eosinophils Relative: 2 %
HCT: 37.1 % (ref 36.0–46.0)
Hemoglobin: 11.8 g/dL — ABNORMAL LOW (ref 12.0–15.0)
Immature Granulocytes: 1 %
Lymphocytes Relative: 25 %
Lymphs Abs: 3.5 K/uL (ref 0.7–4.0)
MCH: 28.4 pg (ref 26.0–34.0)
MCHC: 31.8 g/dL (ref 30.0–36.0)
MCV: 89.2 fL (ref 80.0–100.0)
Monocytes Absolute: 1.1 K/uL — ABNORMAL HIGH (ref 0.1–1.0)
Monocytes Relative: 8 %
Neutro Abs: 8.8 K/uL — ABNORMAL HIGH (ref 1.7–7.7)
Neutrophils Relative %: 64 %
Platelets: 213 K/uL (ref 150–400)
RBC: 4.16 MIL/uL (ref 3.87–5.11)
RDW: 14.6 % (ref 11.5–15.5)
WBC: 13.7 K/uL — ABNORMAL HIGH (ref 4.0–10.5)
nRBC: 0 % (ref 0.0–0.2)

## 2024-05-10 LAB — COMPREHENSIVE METABOLIC PANEL WITH GFR
ALT: 18 U/L (ref 0–44)
AST: 14 U/L — ABNORMAL LOW (ref 15–41)
Albumin: 3.6 g/dL (ref 3.5–5.0)
Alkaline Phosphatase: 76 U/L (ref 38–126)
Anion gap: 12 (ref 5–15)
BUN: 13 mg/dL (ref 8–23)
CO2: 25 mmol/L (ref 22–32)
Calcium: 8.8 mg/dL — ABNORMAL LOW (ref 8.9–10.3)
Chloride: 101 mmol/L (ref 98–111)
Creatinine, Ser: 0.57 mg/dL (ref 0.44–1.00)
GFR, Estimated: >60 mL/min (ref >60)
Glucose, Bld: 157 mg/dL — ABNORMAL HIGH (ref 70–99)
Potassium: 4 mmol/L (ref 3.5–5.1)
Sodium: 138 mmol/L (ref 135–145)
Total Bilirubin: 0.3 mg/dL (ref 0.0–1.2)
Total Protein: 6.1 g/dL — ABNORMAL LOW (ref 6.5–8.1)

## 2024-05-10 LAB — URINALYSIS, ROUTINE W REFLEX MICROSCOPIC
Bilirubin Urine: NEGATIVE
Glucose, UA: NEGATIVE mg/dL
Hgb urine dipstick: NEGATIVE
Ketones, ur: NEGATIVE mg/dL
Leukocytes,Ua: NEGATIVE
Nitrite: NEGATIVE
Protein, ur: NEGATIVE mg/dL
Specific Gravity, Urine: 1.014 (ref 1.005–1.030)
pH: 7 (ref 5.0–8.0)

## 2024-05-10 LAB — TROPONIN T, HIGH SENSITIVITY
Troponin T High Sensitivity: <15 ng/L (ref 0–19)
Troponin T High Sensitivity: <15 ng/L (ref 0–19)

## 2024-05-10 LAB — PRO BRAIN NATRIURETIC PEPTIDE: Pro Brain Natriuretic Peptide: 111 pg/mL (ref <300.0)

## 2024-05-10 MED ORDER — ONDANSETRON HCL 4 MG/2ML IJ SOLN
4.0000 mg | Freq: Once | INTRAMUSCULAR | Status: AC
  Administered 2024-05-10: 4 mg via INTRAVENOUS
  Filled 2024-05-10: qty 2

## 2024-05-10 MED ORDER — MORPHINE SULFATE (PF) 2 MG/ML IV SOLN
2.0000 mg | Freq: Once | INTRAVENOUS | Status: AC
  Administered 2024-05-10: 2 mg via INTRAVENOUS
  Filled 2024-05-10: qty 1

## 2024-05-10 MED ORDER — APIXABAN (ELIQUIS) VTE STARTER PACK (10MG AND 5MG): ORAL_TABLET | ORAL | 0 refills | Status: DC

## 2024-05-10 MED ORDER — APIXABAN 5 MG PO TABS: 5.0000 mg | ORAL_TABLET | Freq: Two times a day (BID) | ORAL | 1 refills | Status: AC

## 2024-05-10 MED ORDER — APIXABAN 5 MG PO TABS
10.0000 mg | ORAL_TABLET | Freq: Once | ORAL | Status: AC
  Administered 2024-05-10: 10 mg via ORAL
  Filled 2024-05-10: qty 2

## 2024-05-10 MED ORDER — KETOROLAC TROMETHAMINE 15 MG/ML IJ SOLN
15.0000 mg | Freq: Once | INTRAMUSCULAR | Status: AC
  Administered 2024-05-10: 15 mg via INTRAVENOUS
  Filled 2024-05-10: qty 1

## 2024-05-10 NOTE — Discharge Instructions (Signed)
 You have been diagnosed with right leg deep venous thromboembolism.  Please take Eliquis to 5 mg tablets every 12 hours during the first week, for 7 days.  On day 8 switch to one 5 mg tablet twice daily.  Please come back to ED or go to your PCP if you have new symptoms or symptoms worsen.  Please call your PCP and make an appointment for a follow-up.  It was a pleasure to help you today.  Delfin Squillace, PA-C

## 2024-05-10 NOTE — ED Provider Notes (Signed)
 [facility] Provider Note    Event Date/Time   First MD Initiated Contact with Patient 05/10/24 1722     (approximate)   History   Back Pain    HPI  [name] is a 68 y.o. female    with a past medical history of SP lumbar microdisc discectomy, lumbar radiculopathy, coronary artery disease, primary hypertension, diabetes, lumbar spondylosis, spinal stenosis of lumbar region,, with no significant past medical history who presents to the ED complaining of right leg pain. According to the patient, symptoms aggravated after surgery.  Symptoms are consistent with bilateral lower leg edema, right lower leg pain, unable to walk due to right leg pain.  Patient denies shortness of breath, orthopnea, wheezing, chest pain, cough.  Patient endorses that she is sleeping in a recliner due to her back pain.  Patient endorses that she is trying to walk but most of the time she is sitting.  She is not taking blood thinners.  Patient is here with a relative.  Her main complaint is right lower leg pain.  Per independent chart review patient had lumbar microdisc discectomy on 04/25/2024.  On November 10/2023 patient had lower bilateral ultrasound to rule out DVT, he was negative for femoral popliteal DVT or superficial thrombophlebitis within either lower extremity.  Patient was seen by neurosurgery as a postop visit on 05/08/2024.  Patient was prescribed with Norco, gabapentin  3 mg in the morning and 600 mg at lunch and 600 mg at bed.  PA ordered physical therapy.  Recommended follow-up with PCP or cardiology for leg swelling.     Patient Active Problem List   Diagnosis Date Noted   Right leg weakness 04/25/2024   Lumbar radiculopathy 04/25/2024   S/P TKR (total knee replacement) using cement, left 01/04/2022   PMB (postmenopausal bleeding) 11/01/2019   Mixed incontinence 11/01/2019   Anemia, unspecified 03/21/2017   Anxiety and depression 03/21/2017   Asthma without status  asthmaticus 03/21/2017   Benign essential tremor 03/21/2017   Claustrophobia 03/21/2017   Fibromyalgia 03/21/2017   GERD (gastroesophageal reflux disease) 03/21/2017   Heart palpitations 03/21/2017   History of gastric ulcer 03/21/2017   History of motion sickness 03/21/2017   Hyperlipidemia 03/21/2017   Hypertension 03/21/2017   Hypothyroid 03/21/2017   Leukocytosis 03/21/2017   Osteoarthritis 03/21/2017   PONV (postoperative nausea and vomiting) 03/21/2017   Seasonal allergies 03/21/2017   Snores 03/21/2017   SOB (shortness of breath) 01/19/2017   Lumbosacral spondylosis without myelopathy 01/11/2017   Cervical radiculopathy at C8 10/04/2016   Migraine without aura and without status migrainosus, not intractable 10/04/2016   Acute nonintractable headache 07/20/2016   Sepsis (HCC) 07/12/2016   Chronic bilateral low back pain with bilateral sciatica 04/27/2016   Sacroiliitis 04/27/2016   Radiculopathy of lumbar region 04/10/2016   History of fusion of cervical spine 02/23/2016   Radiculopathy of cervical region 02/23/2016   Bilateral thoracic back pain 05/07/2014   Knee joint replacement status 12/06/2013   Cervical stenosis of spinal canal 11/21/2012   Neck pain 10/04/2012     Physical Exam   Triage Vital Signs: ED Triage Vitals [05/10/24 1701]  Encounter Vitals Group     BP (!) 159/87     Girls Systolic BP Percentile      Girls Diastolic BP Percentile      Boys Systolic BP Percentile      Boys Diastolic BP Percentile      Pulse Rate 94     Resp  18     Temp 98.2 F (36.8 C)     Temp Source Oral     SpO2 96 %     Weight 240 lb (108.9 kg)     Height 5' 3 (1.6 m)     Head Circumference      Peak Flow      Pain Score 10     Pain Loc      Pain Education      Exclude from Growth Chart     Most recent vital signs: Vitals:   05/10/24 1701  BP: (!) 159/87  Pulse: 94  Resp: 18  Temp: 98.2 F (36.8 C)  SpO2: 96%     Physical Exam Vitals and nursing note  reviewed.  During triage patient was hypertensive, patient is not tachycardic or tachypneic.  General:          Awake, mild distress due to right leg pain, no no signs of difficulty breathing. CV:                  Good peripheral perfusion.  Resp:               Normal effort. no tachypnea, no wheezing, no wheeze, no rales, no rub Abd:                 No distention.  Soft nontender Other:           Right lower extremity: Presence of edema grade 3/3.  Skin presence of excoriation due to scratching.  Mild warmness of the skin.  Tenderness to palpation in the calf and in the anterior area of the leg.  Patient is unable to move toes.  Pulses are positive.  Sensation is intact. Left lower extremity: Skin is intact, presence of edema but edema is less severe than the right leg.  No tenderness to palpation to the calf or anterior area of the leg.  Patient is able to move toes.  Pulses are positive and sensation is intact. ED Results / Procedures / Treatments   Labs (all labs ordered are listed, but only abnormal results are displayed) Labs Reviewed  URINALYSIS, ROUTINE W REFLEX MICROSCOPIC - Abnormal; Notable for the following components:      Result Value   Color, Urine STRAW (*)    APPearance CLEAR (*)    All other components within normal limits  COMPREHENSIVE METABOLIC PANEL WITH GFR - Abnormal; Notable for the following components:   Glucose, Bld 157 (*)    Calcium 8.8 (*)    Total Protein 6.1 (*)    AST 14 (*)    All other components within normal limits  CBC WITH DIFFERENTIAL/PLATELET - Abnormal; Notable for the following components:   WBC 13.7 (*)    Hemoglobin 11.8 (*)    Neutro Abs 8.8 (*)    Monocytes Absolute 1.1 (*)    Abs Immature Granulocytes 0.15 (*)    All other components within normal limits  PRO BRAIN NATRIURETIC PEPTIDE  TROPONIN T, HIGH SENSITIVITY  TROPONIN T, HIGH SENSITIVITY     EKG See physician read    RADIOLOGY I independently reviewed and interpreted  imaging and agree with radiologists findings.      PROCEDURES:  Critical Care performed:   Procedures   MEDICATIONS ORDERED IN ED: Medications  ketorolac  (TORADOL ) 15 MG/ML injection 15 mg (15 mg Intravenous Given 05/10/24 1905)  apixaban (ELIQUIS) tablet 10 mg (10 mg Oral Given 05/10/24 2017)  morphine  (PF) 2 MG/ML  injection 2 mg (2 mg Intravenous Given 05/10/24 2048)  ondansetron  (ZOFRAN ) injection 4 mg (4 mg Intravenous Given 05/10/24 2048)   Clinical Course as of 05/10/24 2053  Thu May 10, 2024  1751 Urinalysis, Routine w reflex microscopic -Urine, Clean Catch(!) Negative for UTI [AE]  1842 DG Chest 2 View No active cardiopulmonary disease. [AE]  1938 CBC with Differential(!) White blood cells elevated, anemia with hemoglobin 11.8.  Neutrophilia, neutrophils 8.8 [AE]  1939 Comprehensive metabolic panel(!) Electrolytes within normal limits, renal function and liver function within normal limits, anion gap within normal limits [AE]  1940 Troponin T, High Sensitivity Within normal limits [AE]  1940 Pro Brain natriuretic peptide Normal limit [AE]  1940 US  Venous Img Lower Right (DVT Study)  Right peroneal calf vein deep venous thrombosis. [AE]  2016 Updated patient with results of US  diagnosed lower right leg.  Consulted Dr. Claudene who recommended to initiate Eliquis 10 mg every 12 hours for a week and then change to 5 mg every 12 hours.  Tonight patient will receive first doses.  Dr. Claudene provided a coupon to help financially to the patient.  Patient is going to be discharged, no respiratory symptoms, renal and liver function are within normal limits.  Patient is agreeable with the plan.  Patient is complaining of right lower pain I will order morphine . [AE]    Clinical Course User Index [AE] Janit Kast, PA-C    IMPRESSION / MDM / ASSESSMENT AND PLAN / ED COURSE  I reviewed the triage vital signs and the nursing notes.  Differential diagnosis includes, but is not  limited to, DVT, CHF, electrolyte derangement, thrombophlebitis, neuropathic pain  Patient's presentation is most consistent with acute complicated illness / injury requiring diagnostic workup.   [name] is a 68 y.o., female who presents today with history of bilateral lower extremities edema, more severe in the right leg and associated to pain in the calf and in the anterior area of the extremity.  Patient denies shortness of breath, orthopnea, wheezing, cough, chest pain.  Patient has history of recent surgery.  Patient is not take any blood thinners.  On a physical exam patient was hypertensive during triage.  No tachycardia or tachypnea.  Patient is in a mild distress due to right lower leg extremity pain.  Cardiopulmonary is clear, no wheezing, no rales, no murmurs, no rubs.  Abdomen is soft nontender nondistended.  Right lower extremity presence of excoriation due to scratching.  Mild warmness to palpation. Pitting edema grade 3/3, tenderness to palpation in the calf and in the anterior tibial area.  Unable to move toes.  Left lower extremity, skin is intact, no tenderness to palpation in the calf or in the anterior tibial area.  Patient is able to move toes.  Sensation is intact, pulses positive. Plan CBC, CMP, BNP. Chest x-ray EKG Right lower extremity ultrasound Toradol  IV Reassess Reassessed the patient, symptoms of right lower leg pain, warmness, tenderness to palpation in the calf  conduced to rule out DVT that was confirmed with US .  I did update patient with diagnosis of right DVT.  Patient continues with pain.  I did order morphine  and Zofran .  Patient started first doses of Eliquis 10 mg due to pharmacy hours.  Dr. Claudene was consulted who advised to give Eliquis 10 mg every 12 hours the first week and 5 mg every 12 hours for the next weeks.  Dr. Claudene gave a coupon to help the patient financially to buy the  prescription.  I did explain the patient her diagnosis, this patient is  concerned about PE or the risk of the clot going into her lungs.  I did explain to her the treatment.  I did explain her side effects of Eliquis. Patient's diagnosis is consistent with right lower extremity DVT. I independently reviewed and interpreted imaging and agree with radiologists findings. Labs are  reassuring ruling out heart failure, BNP was within normal limits, electrolytes, liver function and renal function within normal limits.. I did review the patient's allergies and medications.The patient is in stable and satisfactory condition for discharge home  Patient will be discharged home with prescriptions for Eliquis 10 mg every 12 hours the first week, Eliquis 5 mg every 12 hours after first week.. Patient is to follow up with PCP as needed or otherwise directed. Patient is given ED precautions to return to the ED for any worsening or new symptoms. Discussed plan of care with patient, answered all of patient's questions, Patient agreeable to plan of care. Advised patient to take medications according to the instructions on the label. Discussed possible side effects of new medications. Patient verbalized understanding.  FINAL CLINICAL IMPRESSION(S) / ED DIAGNOSES   Final diagnoses:  Lower leg DVT (deep venous thromboembolism), acute, right (HCC)     Rx / DC Orders   ED Discharge Orders          Ordered    APIXABAN (ELIQUIS) VTE STARTER PACK (10MG  AND 5MG )       Note to Pharmacy: If starter pack unavailable, substitute with seventy-four 5 mg apixaban tabs following the above SIG directions.   05/10/24 2002    apixaban (ELIQUIS) 5 MG TABS tablet  2 times daily        05/10/24 2002             Note:  This document was prepared using Dragon voice recognition software and may include unintentional dictation errors.   Janit Kast, PA-C 05/10/24 2053    Claudene Rover, MD 05/11/24 [phone]

## 2024-05-10 NOTE — ED Triage Notes (Addendum)
 Pt brought in via ems from home.  Pt reports lower back pain and pain both legs.  Pt had back surgery 04/25/24.   Pt taking pain meds without relief.  Pt denies urinary sx.  Pt has nausea.  Pt alert  speech clear.

## 2024-05-10 NOTE — ED Notes (Signed)
 Pt discharged to home, instructions and medications reviewed.  Pt verbalized understanding, no questions at this time.

## 2024-05-10 NOTE — Telephone Encounter (Signed)
 Please see below message and advise.     Media Information  Document Information  AMB Correspondence  NEUROSURGERY ANSWERING SERVICE CALL  05/10/2024 16:52  Attached To:  Jenkins JONELLE Brooks  Source Information  Default, Provider, MD

## 2024-05-11 ENCOUNTER — Telehealth: Payer: Self-pay

## 2024-05-11 NOTE — Telephone Encounter (Signed)
 Patient advised.

## 2024-05-11 NOTE — Telephone Encounter (Signed)
 [name]  P [facility]-Neurosurgery [facility] Pt states that they went to the ER yesterday and they have a blood clot in their leg. They are wanting to know if they can still do PT with this? Their appt is supposed to be for tomorrow but they will cancel if its not recommended?

## 2024-05-11 NOTE — Telephone Encounter (Signed)
 Okay to start PT, but make sure the therapist is aware of her DVT. They should not do any massage to her legs.

## 2024-05-11 NOTE — Telephone Encounter (Signed)
 Left message to call back.

## 2024-05-17 ENCOUNTER — Telehealth: Payer: Self-pay | Admitting: Orthopedic Surgery

## 2024-05-17 DIAGNOSIS — Z9889 Other specified postprocedural states: Secondary | ICD-10-CM

## 2024-05-17 MED ORDER — HYDROCODONE-ACETAMINOPHEN 10-325 MG PO TABS: 1.0000 | ORAL_TABLET | ORAL | 0 refills | Status: DC | PRN

## 2024-05-17 NOTE — Telephone Encounter (Signed)
 Date11/20/25 Provider LUNA Med Requesting Hydrocodone   Ph[facility]  Patient contact [phone]

## 2024-05-17 NOTE — Telephone Encounter (Signed)
 [facility], Cesar, called to say she went for an initial eval for physical therapy today. The patient informed her that she had blood clots in her right leg so she wanted to discuss what exercises were appropriate for the patients care?   Please call [phone]

## 2024-05-17 NOTE — Telephone Encounter (Signed)
 Refill of hydrocodone  okay and sent to pharmacy. Will keep directions the same, but tell her to take least amount needed.   PMP reviewed and is appropriate.   DOS: 04/25/24 Right L4-L5 microdiscectomy

## 2024-05-17 NOTE — Telephone Encounter (Signed)
 We do not have a protocol for DVTs. She can mobilize as tolerated. No massaging her calf/legs.   She should maintain our other restrictions of no bending, twisting, or lifting  >10 lbs.   If he needs further restrictions for DVT, then he should ask her PCP.

## 2024-05-17 NOTE — Telephone Encounter (Signed)
 Spoke with Rogue, he saw patient Saturday 05/12/2024 to evaluate and patient told him about her DVT in right leg he wanted to confirm that he is ok to proceed working with the patient on PT. Looked at the phone note from last week- per Glade Boys, [place] then it was ok to proceed with PT but not massage her leg. He wanted to make sure there were no other restrictions in patient trying to walk or anything else. Rogue states they do not have a protocol in place for DVTs and it just depends on the provider. Advised I would check and we would call him back.

## 2024-05-18 NOTE — Telephone Encounter (Signed)
 Patient advised.

## 2024-05-18 NOTE — Telephone Encounter (Signed)
 Cesar advised

## 2024-05-23 ENCOUNTER — Emergency Department

## 2024-05-23 ENCOUNTER — Other Ambulatory Visit: Payer: Self-pay

## 2024-05-23 ENCOUNTER — Emergency Department
Admission: EM | Admit: 2024-05-23 | Discharge: 2024-05-23 | Disposition: A | Discharge status: Home / Self Care | Attending: Emergency Medicine | Admitting: Emergency Medicine

## 2024-05-23 DIAGNOSIS — M79604 Pain in right leg: Secondary | ICD-10-CM

## 2024-05-23 DIAGNOSIS — I10 Essential (primary) hypertension: Secondary | ICD-10-CM | POA: Insufficient documentation

## 2024-05-23 DIAGNOSIS — I82409 Acute embolism and thrombosis of unspecified deep veins of unspecified lower extremity: Secondary | ICD-10-CM | POA: Insufficient documentation

## 2024-05-23 DIAGNOSIS — R600 Localized edema: Secondary | ICD-10-CM | POA: Insufficient documentation

## 2024-05-23 DIAGNOSIS — Z9889 Other specified postprocedural states: Secondary | ICD-10-CM

## 2024-05-23 DIAGNOSIS — R29898 Other symptoms and signs involving the musculoskeletal system: Secondary | ICD-10-CM

## 2024-05-23 DIAGNOSIS — Z7901 Long term (current) use of anticoagulants: Secondary | ICD-10-CM | POA: Insufficient documentation

## 2024-05-23 DIAGNOSIS — R209 Unspecified disturbances of skin sensation: Secondary | ICD-10-CM | POA: Diagnosis not present

## 2024-05-23 LAB — COMPREHENSIVE METABOLIC PANEL WITH GFR
ALT: 17 U/L (ref 0–44)
AST: 24 U/L (ref 15–41)
Albumin: 3.9 g/dL (ref 3.5–5.0)
Alkaline Phosphatase: 87 U/L (ref 38–126)
Anion gap: 13 (ref 5–15)
BUN: 8 mg/dL (ref 8–23)
CO2: 27 mmol/L (ref 22–32)
Calcium: 9.2 mg/dL (ref 8.9–10.3)
Chloride: 102 mmol/L (ref 98–111)
Creatinine, Ser: 0.59 mg/dL (ref 0.44–1.00)
GFR, Estimated: >60 mL/min (ref >60)
Glucose, Bld: 115 mg/dL — ABNORMAL HIGH (ref 70–99)
Potassium: 3.2 mmol/L — ABNORMAL LOW (ref 3.5–5.1)
Sodium: 143 mmol/L (ref 135–145)
Total Bilirubin: 0.4 mg/dL (ref 0.0–1.2)
Total Protein: 6.6 g/dL (ref 6.5–8.1)

## 2024-05-23 LAB — CBC WITH DIFFERENTIAL/PLATELET
Abs Immature Granulocytes: 0.03 K/uL (ref 0.00–0.07)
Basophils Absolute: 0.1 K/uL (ref 0.0–0.1)
Basophils Relative: 1 %
Eosinophils Absolute: 0.2 K/uL (ref 0.0–0.5)
Eosinophils Relative: 2 %
HCT: 37.7 % (ref 36.0–46.0)
Hemoglobin: 12 g/dL (ref 12.0–15.0)
Immature Granulocytes: 0 %
Lymphocytes Relative: 29 %
Lymphs Abs: 2.6 K/uL (ref 0.7–4.0)
MCH: 28.2 pg (ref 26.0–34.0)
MCHC: 31.8 g/dL (ref 30.0–36.0)
MCV: 88.5 fL (ref 80.0–100.0)
Monocytes Absolute: 0.6 K/uL (ref 0.1–1.0)
Monocytes Relative: 6 %
Neutro Abs: 5.6 K/uL (ref 1.7–7.7)
Neutrophils Relative %: 62 %
Platelets: 232 K/uL (ref 150–400)
RBC: 4.26 MIL/uL (ref 3.87–5.11)
RDW: 14.8 % (ref 11.5–15.5)
WBC: 9 K/uL (ref 4.0–10.5)
nRBC: 0 % (ref 0.0–0.2)

## 2024-05-23 LAB — URINALYSIS, ROUTINE W REFLEX MICROSCOPIC
Bilirubin Urine: NEGATIVE
Glucose, UA: NEGATIVE mg/dL
Hgb urine dipstick: NEGATIVE
Ketones, ur: NEGATIVE mg/dL
Leukocytes,Ua: NEGATIVE
Nitrite: NEGATIVE
Protein, ur: NEGATIVE mg/dL
Specific Gravity, Urine: 1.012 (ref 1.005–1.030)
pH: 8 (ref 5.0–8.0)

## 2024-05-23 LAB — PRO BRAIN NATRIURETIC PEPTIDE: Pro Brain Natriuretic Peptide: 190 pg/mL (ref <300.0)

## 2024-05-23 MED ORDER — HYDROMORPHONE HCL 1 MG/ML IJ SOLN
1.0000 mg | Freq: Once | INTRAMUSCULAR | Status: AC
  Administered 2024-05-23: 1 mg via INTRAVENOUS
  Filled 2024-05-23: qty 1

## 2024-05-23 MED ORDER — HYDROMORPHONE HCL 1 MG/ML IJ SOLN
0.5000 mg | Freq: Once | INTRAMUSCULAR | Status: AC
  Administered 2024-05-23: 0.5 mg via INTRAVENOUS
  Filled 2024-05-23: qty 0.5

## 2024-05-23 MED ORDER — HYDROMORPHONE HCL 2 MG PO TABS: 1.0000 mg | ORAL_TABLET | Freq: Four times a day (QID) | ORAL | 0 refills | Status: DC | PRN

## 2024-05-23 NOTE — Discharge Instructions (Addendum)
 Dr. Claudene will give you a call on Monday morning to schedule an appointment for further evaluation.  If you do not receive a call, try giving them a call Tuesday morning.

## 2024-05-23 NOTE — ED Triage Notes (Addendum)
 Coming from home, live with husband. Patient used to walk before surgery but now can't walk states she has been chair or bed bound. Right lower back pain that travels to lower right foot. Bilateral pitting edema. Pulse felt in bilateral feet.Diagnosed blood clot in right leg last ED visit. She is taking Eliquis . Surgery on 04/25/2024, took out disc in back. Patient states she feels worse since the surgery. Started hydrocodone  for pain but patient states this has not helped to control the pain. Hx of parkinson.

## 2024-05-23 NOTE — ED Provider Notes (Signed)
-----------------------------------------   7:45 PM on 05/23/2024 -----------------------------------------  Blood pressure (!) 161/70, pulse 74, temperature 98.2 F (36.8 C), temperature source Oral, resp. rate 19, height 5' 3 (1.6 m), weight 108.9 kg, SpO2 93%.  Assuming care from Dr. Jacolyn.  In short, [name] is a 68 y.o. female with a chief complaint of back pain.  Refer to the original H&P for additional details.  The current plan of care is to await lumbar MRI and update Dr. Claudene of results. Discuss appropriate disposition.   ----------------------------------------- 9:28 PM on 05/23/2024 -----------------------------------------  Discussed case with Dr. Claudene of neurosurgery who reviewed the patient's MRI studies.  Images shows no acute neurosurgical pathology and no indication for emergent intervention at this.  No clear structural etiology identified to explain the patient's bilateral leg symptoms.  Dr. Claudene recommends patient can follow up outpatient, though admission for pain control was offered to the patient.  Patient is adamant about wanting to be discharged home. pain medication prescription printed off for patient to get filled at a pharmacy that is open on Thanksgiving. ED return precautions reviewed and strict follow-up instructions provided.  Dr. Theressa office will contact patient on Monday morning to move scheduled appointment up to sooner date.  Patient is in agreement with this plan.  Stable at discharge.    Medications  HYDROmorphone  (DILAUDID ) injection 0.5 mg (0.5 mg Intravenous Given 05/23/24 1158)  HYDROmorphone  (DILAUDID ) injection 1 mg (1 mg Intravenous Given 05/23/24 1258)  HYDROmorphone  (DILAUDID ) injection 1 mg (1 mg Intravenous Given 05/23/24 1708)  HYDROmorphone  (DILAUDID ) injection 0.5 mg (0.5 mg Intravenous Given 05/23/24 1916)     ED Discharge Orders          Ordered    HYDROmorphone  (DILAUDID ) 2 MG tablet  Every 6 hours PRN        05/23/24  2132           Final diagnoses:  Right leg pain  Bilateral leg edema      Margrette Rebbeca LABOR, PA-C 05/23/24 2146    Jacolyn Pae, MD 05/25/24 [phone]

## 2024-05-23 NOTE — ED Provider Notes (Signed)
 [facility] Provider Note    Event Date/Time   First MD Initiated Contact with Patient 05/23/24 1109     (approximate)   History   Back Pain and Leg Pain   HPI  [name] is a 68 y.o. female with a history of lumbar radiculopathy, hypertension, hyperlipidemia, and DVT on Eliquis  who presents with bilateral leg swelling and right leg pain.  The patient recently had a lumbar laminectomy and discectomy on 10/29.  She has had persistent right leg pain since that time along with weakness.  The pain starts in her buttock and goes down the right leg.  She subsequently started to have swelling in the right leg.  She came to the ED about 2 weeks ago and was diagnosed with a DVT and started on Eliquis , which she has been taking.  She has been taking hydrocodone  for pain with minimal relief.  She states the pain in the right leg is persistent and not getting any better.  She is barely able to walk.  In addition, both legs have now become significantly swollen, including new swelling of the left leg and worsening swelling of the right leg.  She denies any shortness of breath, cough, or fever.  She has no chest pain.  I reviewed the past medical records.  I confirmed the lumbar laminectomy and decompression microdiscectomy on 10/29 by neurosurgery.  She was subsequently seen in the ED on 11/13 with leg pain and swelling and diagnosed with an acute DVT and started on Eliquis .   Physical Exam   Triage Vital Signs: ED Triage Vitals  Encounter Vitals Group     BP 05/23/24 1115 (!) 156/67     Girls Systolic BP Percentile --      Girls Diastolic BP Percentile --      Boys Systolic BP Percentile --      Boys Diastolic BP Percentile --      Pulse Rate 05/23/24 1109 68     Resp 05/23/24 1109 17     Temp 05/23/24 1109 98.2 F (36.8 C)     Temp Source 05/23/24 1109 Oral     SpO2 05/23/24 1109 99 %     Weight 05/23/24 1109 240 lb (108.9 kg)     Height 05/23/24 1109 5' 3 (1.6  m)     Head Circumference --      Peak Flow --      Pain Score 05/23/24 1109 10     Pain Loc --      Pain Education --      Exclude from Growth Chart --     Most recent vital signs: Vitals:   05/23/24 1630 05/23/24 1631  BP: (!) 161/70   Pulse: 74   Resp:    Temp:    SpO2: 92% 93%     General: Awake, no distress.  CV:  Good peripheral perfusion.  Resp:  Normal effort.  Abd:  No distention.  Other:  4/5 motor strength to the left leg, right 3/5 strength in the right leg.  2+ bilateral lower extremity edema.  2+ DP pulses.  Normal cap refill.   ED Results / Procedures / Treatments   Labs (all labs ordered are listed, but only abnormal results are displayed) Labs Reviewed  URINALYSIS, ROUTINE W REFLEX MICROSCOPIC - Abnormal; Notable for the following components:      Result Value   Color, Urine YELLOW (*)    APPearance CLEAR (*)    All  other components within normal limits  COMPREHENSIVE METABOLIC PANEL WITH GFR - Abnormal; Notable for the following components:   Potassium 3.2 (*)    Glucose, Bld 115 (*)    All other components within normal limits  PRO BRAIN NATRIURETIC PEPTIDE  CBC WITH DIFFERENTIAL/PLATELET     EKG     RADIOLOGY  Chest x-ray: I independently viewed and interpreted the images;  US  venous LE bilateral: No evidence of acute DVT.  Resolution of right lower extremity DVT.  PROCEDURES:  Critical Care performed: No  Procedures   MEDICATIONS ORDERED IN ED: Medications  HYDROmorphone  (DILAUDID ) injection 0.5 mg (0.5 mg Intravenous Given 05/23/24 1158)  HYDROmorphone  (DILAUDID ) injection 1 mg (1 mg Intravenous Given 05/23/24 1258)  HYDROmorphone  (DILAUDID ) injection 1 mg (1 mg Intravenous Given 05/23/24 1708)  HYDROmorphone  (DILAUDID ) injection 0.5 mg (0.5 mg Intravenous Given 05/23/24 1916)     IMPRESSION / MDM / ASSESSMENT AND PLAN / ED COURSE  I reviewed the triage vital signs and the nursing notes.  67 year old female with PMH as  noted above presents with worsening right leg pain after recent spinal surgery and worsening bilateral lower extremity edema.  Differential diagnosis includes, but is not limited to:  Pain: Postoperative pain, persistent radiculopathy, sciatica, pain related to her DVT, or combination of the above.  The patient also has persistent weakness.  We will give analgesia, obtain lab workup, and discuss with neurosurgery.  Edema: Dependent edema, postoperative changes, DVT, new onset CHF or renal insufficiency.  Exam reveals 2+ bilateral lower extremity edema, which the patient states initially was just in the right leg but now is bilateral.  Given these worsening symptoms, we will obtain a repeat ultrasound to rule out a DVT in the left leg or significant worsening of the DVT in the right leg.  Will obtain lab workup and a chest x-ray to evaluate for possible CHF or more systemic fluid overload.  Patient's presentation is most consistent with acute presentation with potential threat to life or bodily function.  ----------------------------------------- 7:49 PM on 05/23/2024 -----------------------------------------  Lab workup is reassuring.  CMP shows no acute findings.  CBC is also unremarkable.  BNP is negative, and the chest x-ray is clear.  There is no evidence of CHF.  Ultrasound shows no evidence of acute DVT on the left, and a previous right-sided DVT appears to have resolved.  I consulted and discussed the case with Dr. Claudene from neurosurgery who evaluated the patient.  He recommended MRI of the thoracic and lumbar spine for further evaluation and to help determine appropriate disposition.  These are pending.  I have signed the patient out to the oncoming ED APP Margrette and Dr. Levander.   FINAL CLINICAL IMPRESSION(S) / ED DIAGNOSES   Final diagnoses:  Right leg pain  Bilateral leg edema     Rx / DC Orders   ED Discharge Orders     None        Note:  This document was prepared  using Dragon voice recognition software and may include unintentional dictation errors.    Jacolyn Pae, MD 05/23/24 1950

## 2024-05-23 NOTE — Consult Note (Signed)
 Consulting Department:  Emergency department  Primary Physician:  Valora Lynwood FALCON, MD  Chief Complaint: Worsening right lower extremity numbness and weakness  History of Present Illness: 05/23/2024 [name] is a 68 y.o. female who presents with the chief complaint of worsening right lower extremity numbness weakness and pain.  Patient is a pleasant 68 year old woman with a history of cervical spondylosis having underwent a previous cervical spinal fusion.  She established care with us  at [facility] neurosurgery with her initial consultation being on 04/03/2024.  She has previously seen in the emergency department with a severe back pain and right lower extremity pain/numbness exacerbation.  She developed severe back pain radiating into her right lower extremity that involves her entire leg she had very limited to no left leg symptoms.  She felt like her pain was worse with standing and walking she had progressive numbness weakness and tingling in the right leg at the time of her initial evaluation.  Notably she did have prior injections in the lumbar spine starting back in 2023 and most recently in July 2025  On initial evaluation she has significant weakness in her plantarflexion dorsiflexion EHL inversion weakness.  She came into the clinic nonambulatory at that visit and was utilizing a wheelchair.  She had a CT scan which demonstrated the disc herniation so an MRI was ordered.  She was then scheduled for a right sided lumbar laminectomy and discectomy at L4-5 on the given concern for a right sided L5 radiculopathy.  Prior to surgery her pain was so severe that she requested hospital bed for home use.  She underwent surgical decompression and unfortunately did not have any significant improvement in her pain sensation or motor examination.  She instead continued to have worsening and some progression of her symptomatology including worsening numbness that was circumferential involving her  entire right lower extremity proximal to her knee and started to involve some burning dysesthesias in her left lower extremity.  There was concern for DVT so she had workup and was eventually found to have a DVT and treated with Eliquis .  She has been started on neuropathic pain medication.   She came into the emergency department again today for worsening symptoms.  She continues to have severe right lower extremity pain weakness numbness and tingling which feels like is continually progressive.  She did not notice a significant change after previous surgery and was concerned for continued progression.  The symptoms are causing a significant impact on the patient's life.   Review of Systems:  A 10 point review of systems is negative, except for the pertinent positives and negatives detailed in the HPI.  Past Medical History: Past Medical History:  Diagnosis Date   Anemia    Anxiety    a.) on BZO (diazepam) PRN   Aortic atherosclerosis    Arthritis    Asthma    Benign essential tremor    Bilateral thoracic back pain    CAD (coronary artery disease)    a.) MV 01/01/2019: EF 47%; reversible ant ischemia; b.) cCTA 01/18/2019: Ca2+ = 200; < 25% p-m LAD, diag, LCx, oRCA, p-dRCA - FFR CT analysis: LAD 0.87, LCx 0.95, RCA 0.91.   Cerebral microvascular disease    Claustrophobia    Complication of anesthesia    a.) delayed emergence; b.) PONV   Depression    Diastolic dysfunction 02/16/2017   a.) TTE 02/16/2017: EF >55%, triv MR/PR, mild TR, G1DD; b.) TTE 02/13/2019: EF 45%, mild LVH, septal and apical HK,  triv MR, mild TR.   Dyspnea    Family history of adverse reaction to anesthesia    a.) delayed emergence in 1st degree female relative (son)   Fibromyalgia    Fusion of spine of cervical region    Gastric ulcer    GERD (gastroesophageal reflux disease)    Hemorrhoids    Hepatic steatosis    History of motion sickness    History of recent fall 04/20/2024   History of seasonal  allergies    History of sepsis    Hyperlipidemia    Hypertension    Hypothyroidism    Leukocytosis    Migraines    Obesity    Palpitations    Parkinson disease (HCC)    Plantar fasciitis (left) s/p release    PONV (postoperative nausea and vomiting)    Proliferative diabetic retinopathy (HCC)    Snores    a.) has had negative PSG testing   Spinal stenosis of cervical region with radiculopathy    a.) s/p ACDF C4-C7)   Spinal stenosis of lumbar region with radiculopathy    Spondylosis of lumbar spine without myelopathy    Statin myopathy    Status post carpal tunnel release of both wrists    Type 2 diabetes mellitus treated with insulin  (HCC)     Past Surgical History: Past Surgical History:  Procedure Laterality Date   ACDF C4-C7 WITH ALLOGRAFT INSERTION N/A 11/29/2012   ACHILLES TENDON LENGTHENING Bilateral 2010   ARTHRODESIS ANTERIOR CERVICAL SPINE N/A 12/04/2012   BREAST BIOPSY Bilateral 1970's   BREAST BIOPSY Left    stereotactic   CARPAL TUNNEL RELEASE Bilateral    CESAREAN SECTION     COLONOSCOPY WITH PROPOFOL  N/A 04/14/2016   Procedure: COLONOSCOPY WITH PROPOFOL ;  Surgeon: Lamar ONEIDA Holmes, MD;  Location: [facility] ENDOSCOPY;  Service: Endoscopy;  Laterality: N/A;   COLONOSCOPY WITH PROPOFOL  N/A 02/15/2020   Procedure: COLONOSCOPY WITH PROPOFOL ;  Surgeon: Maryruth Ole ONEIDA, MD;  Location: [facility] ENDOSCOPY;  Service: Endoscopy;  Laterality: N/A;   ESOPHAGOGASTRODUODENOSCOPY (EGD) WITH PROPOFOL  N/A 04/14/2016   Procedure: ESOPHAGOGASTRODUODENOSCOPY (EGD) WITH PROPOFOL ;  Surgeon: Lamar ONEIDA Holmes, MD;  Location: [facility] ENDOSCOPY;  Service: Endoscopy;  Laterality: N/A;   ESOPHAGOGASTRODUODENOSCOPY (EGD) WITH PROPOFOL  N/A 02/15/2020   Procedure: ESOPHAGOGASTRODUODENOSCOPY (EGD) WITH PROPOFOL ;  Surgeon: Maryruth Ole ONEIDA, MD;  Location: [facility] ENDOSCOPY;  Service: Endoscopy;  Laterality: N/A;   FLEXIBLE SIGMOIDOSCOPY N/A 11/03/2016   Procedure: FLEXIBLE SIGMOIDOSCOPY;  Surgeon:  Holmes Lamar ONEIDA, MD;  Location[facility] END[facility]COPY;  Service: Endoscopy;  Laterality: N/A;   FLEXIBLE SIGMOIDOSCOPY N/A 01/31/2017   Procedure: FLEXIBLE SIGMOIDOSCOPY;  Surgeon: Holmes Lamar ONEIDA, MD;  Location: [facility] ENDOSCOPY;  Service: Endoscopy;  Laterality: N/A;   LUMBAR LAMINECTOMY/DECOMPRESSION MICRODISCECTOMY Right 04/25/2024   Procedure: LUMBAR LAMINECTOMY/DECOMPRESSION MICRODISCECTOMY 1 LEVEL;  Surgeon: Clois Fret, MD;  Location: [facility] ORS;  Service: Neurosurgery;  Laterality: Right;  RIGHT L4/5 MICRODISCECTOMY   NERVE SURGERY     on back   PLANTAR FASCIA SURGERY Left    SHOULDER ARTHROSCOPY Right    TONSILLECTOMY     TOTAL KNEE ARTHROPLASTY Right 11/27/2013   TOTAL KNEE ARTHROPLASTY Left 01/04/2022   Procedure: TOTAL KNEE ARTHROPLASTY;  Surgeon: Leora Lynwood SAUNDERS, MD;  Location: [facility] ORS;  Service: Orthopedics;  Laterality: Left;    Allergies: Allergies as of 05/23/2024 - Review Complete 05/23/2024  Allergen Reaction Noted   Cat dander Other (See Comments) and Itching 02/28/2013   Oxycodone Other (See Comments) 09/30/2015   Penicillin g Itching 04/03/2024  Statins Other (See Comments) 02/13/2020   Zosyn  [piperacillin  sod-tazobactam so] Swelling 11/03/2016    Medications: No current facility-administered medications for this encounter.  Current Outpatient Medications:    amLODipine  (NORVASC ) 10 MG tablet, Take 10 mg by mouth at bedtime., Disp: , Rfl:    apixaban  (ELIQUIS ) 5 MG TABS tablet, Take 1 tablet (5 mg total) by mouth 2 (two) times daily., Disp: 60 tablet, Rfl: 1   APIXABAN  (ELIQUIS ) VTE STARTER PACK (10MG  AND 5MG ), Take as directed on package: start with two-5mg  tablets twice daily for 7 days. On day 8, switch to one-5mg  tablet twice daily., Disp: 74 each, Rfl: 0   buPROPion (WELLBUTRIN XL) 150 MG 24 hr tablet, Take 150 mg by mouth in the morning., Disp: , Rfl:    carbidopa-levodopa (SINEMET IR) 25-100 MG tablet, Take 2 tablets by mouth in the morning, at noon,  and at bedtime., Disp: , Rfl:    citalopram  (CELEXA ) 20 MG tablet, Take 20 mg by mouth at bedtime., Disp: , Rfl:    diazepam (VALIUM) 5 MG tablet, Take 5 mg by mouth daily as needed for anxiety., Disp: , Rfl:    gabapentin  (NEURONTIN ) 300 MG capsule, 300mg  po q morning, 600mg  po at lunch, 600mg  q hs., Disp: 150 capsule, Rfl: 0   HYDROcodone -acetaminophen  (NORCO) 10-325 MG tablet, Take 1 tablet by mouth every 4 (four) hours as needed for severe pain (pain score 7-10)., Disp: 30 tablet, Rfl: 0   insulin  degludec (TRESIBA) 100 UNIT/ML FlexTouch Pen, Inject 10 Units into the skin at bedtime., Disp: , Rfl:    methocarbamol  (ROBAXIN ) 500 MG tablet, Take 1 tablet (500 mg total) by mouth every 6 (six) hours as needed for muscle spasms., Disp: 120 tablet, Rfl: 0   olmesartan (BENICAR) 40 MG tablet, Take 40 mg by mouth 2 (two) times daily., Disp: , Rfl:    pantoprazole  (PROTONIX ) 40 MG tablet, Take 40 mg by mouth at bedtime., Disp: , Rfl:    pioglitazone (ACTOS) 30 MG tablet, Take 30 mg by mouth in the morning., Disp: , Rfl:    polyethylene glycol powder (SMOOTH LAX) 17 GM/SCOOP powder, Take 17 g by mouth daily as needed. Dissolve 1 capful (17g) in 4-8 ounces of liquid and take by mouth daily., Disp: 238 g, Rfl: 0   senna (SENOKOT) 8.6 MG TABS tablet, Take 1 tablet (8.6 mg total) by mouth 2 (two) times daily as needed., Disp: 30 tablet, Rfl: 0   spironolactone (ALDACTONE) 25 MG tablet, Take 25 mg by mouth in the morning., Disp: , Rfl:    Social History: Social History   Tobacco Use   Smoking status: Former    Current packs/day: 0.00    Average packs/day: 2.0 packs/day for 35.0 years (70.0 ttl pk-yrs)    Types: Cigarettes    Start date: 07/01/1965    Quit date: 07/01/2000    Years since quitting: 23.9   Smokeless tobacco: Never  Vaping Use   Vaping status: Never Used  Substance Use Topics   Alcohol use: No   Drug use: No    Family Medical History: Family History  Problem Relation Age of Onset    Breast cancer Mother 14   Hypertension Father    Cancer Sister        Hodkin's lymphoma    Physical Examination: Vitals:   05/23/24 1630 05/23/24 1631  BP: (!) 161/70   Pulse: 74   Resp:    Temp:    SpO2: 92% 93%  General: Patient is well developed, well nourished, calm, collected, and in no apparent distress.  NEUROLOGICAL:  General: In some distress with baseline parkinsonian tremors noted. Awake, alert, oriented to person, place, and time.  Pupils equal round and reactive to light.   Strength:  Side Iliopsoas Quads Hamstring PF DF EHL  R 1-2 3 3 1  0-1 0-1  L 5 5 5 5 5 5     Bilateral upper and lower extremity sensation is intact to light touch.  Deep tendon reflexes are difficult to elicit secondary to body habitus, however patient does appear to have sustained clonus in the right lower extremity and approximately 4 beats of clonus in the left lower extremity.  She has allodynia in the left lower extremity to approximately distal thigh and decreased sensation with paresthesias in the right lower extremity to distal thigh.  Imaging: US  Venous Img Lower Bilateral Result Date: 05/23/2024 CLINICAL DATA:  Worsening lower extremity swelling and pain. EXAM: BILATERAL lower Extremity Venous Doppler Ultrasound TECHNIQUE: Gray-scale sonography with compression, as well as color and duplex ultrasound, were performed to evaluate the deep venous system(s) from the level of the common femoral vein through the popliteal and proximal calf veins. COMPARISON:  05/10/2024.  05/02/2024. FINDINGS: VENOUS Normal compressibility of the common femoral, superficial femoral, and popliteal veins, as well as the visualized calf veins. Visualized portions of profunda femoral vein and great saphenous vein unremarkable. No filling defects to suggest DVT on grayscale or color Doppler imaging. Doppler waveforms show normal direction of venous flow, normal respiratory plasticity and response to  augmentation. OTHER None. Limitations: Limited visualization of the LEFT peroneal vein. IMPRESSION: No lower extremity DVT. Previously seen RIGHT peroneal vein thrombus is not identified on today's study. Electronically Signed   By: Aliene Lloyd M.D.   On: 05/23/2024 14:24   DG Chest Port 1 View Result Date: 05/23/2024 EXAM: 1 View Xray Of The Chest 05/23/2024 11:55:55 Am COMPARISON: 05/10/2024 CLINICAL HISTORY: Edema FINDINGS: LUNGS AND PLEURA: No focal pulmonary opacity. No pleural effusion. No pneumothorax. HEART AND MEDIASTINUM: Stable cardiomegaly. BONES AND SOFT TISSUES: Cervical fixation hardware in place. No acute osseous abnormality. IMPRESSION: 1. Stable cardiomegaly. No active lung disease . Electronically signed by: Norleen Kil MD 05/23/2024 12:42 PM EST RP Workstation: [id number]     I have personally reviewed the images and agree with the above interpretation.  Labs:    Latest Ref Rng & Units 05/23/2024   11:53 AM 05/10/2024    6:53 PM 06/26/2022   11:43 AM  CBC  WBC 4.0 - 10.5 K/uL 9.0  13.7  16.3   Hemoglobin 12.0 - 15.0 g/dL 87.9  88.1  86.1   Hematocrit 36.0 - 46.0 % 37.7  37.1  43.9   Platelets 150 - 400 K/uL 232  213  338       Latest Ref Rng & Units 05/23/2024   11:53 AM 05/10/2024    6:53 PM 04/20/2024    1:30 PM  BMP  Glucose 70 - 99 mg/dL 884  842  891   BUN 8 - 23 mg/dL 8  13  22    Creatinine 0.44 - 1.00 mg/dL 9.40  9.42  9.11   Sodium 135 - 145 mmol/L 143  138  138   Potassium 3.5 - 5.1 mmol/L 3.2  4.0  3.6   Chloride 98 - 111 mmol/L 102  101  100   CO2 22 - 32 mmol/L 27  25  26    Calcium 8.9 - 10.3 mg/dL  9.2  8.8  8.8         Assessment and Plan: Ms. Gagliano is a pleasant 68 y.o. female with history of progressive right lower extremity weakness numbness and tingling.  She was previously rapidly declining since a an exacerbation in October and was seen in our clinic and found to have symptoms consistent with a right lower extremity radiculopathy  primarily in the L5 distribution secondary to a L4-5 disc herniation.  Because of her severe weakness numbness and tingling in the right lower extremity to the point where she was requesting hospital bed and utilizing a wheelchair where she was previously ambulatory she underwent surgical decompression via laminectomy discectomy at L4-5 on the right.  Unfortunately has not had any significant improvement and did have a postoperative DVT.  She however has need to have progression of her lower extremity weakness numbness and tingling.  At this point now her sensory changes are circumferential proximal to the knee on the right and she has allodynia noted in the left lower extremity proximal to the knee.  She has 4 beats of clonus on the left and sustained clonus on the right.  Deep tendon reflexes are difficult to elicit secondary to body habitus.  Her motor examination continues to worsen as well.  She has a near complete loss of ankle function and toe function at this time, however she also has significant hip flexor weakness that is to the point where she is no longer antigravity.  She has near antigravity strength but significant weakness noted in her right quadriceps.  Her left lower extremity appears to be at least 4+ throughout.  Given her progressive worsening of her neurologic examination at this point her symptoms do not clearly localize to the L5 dermatome/myotome alone.  She has severe weakness in her entire right lower extremity at this point with profound weakness in hip flexion which is often L2-3 mediated, knee extension as well which is L4 mediated.   At this point is unclear what the etiology of her progressive weakness is, it would be uncommon for this to be a continued progression of a L5 radiculopathy alone as she is having severe deficits in proximal myotomes as well, given the sustained clonus I am concerned for a thoracic localization.  Her bilateral upper extremities do not show any  pathologic reflexes nor any weakness despite her previous cervical surgery.  I would like her to undergo an MRI of her lumbar and thoracic spine to rule out any compressive etiologies.  Some patients with longstanding diabetes can develop severe lower extremity pain syndrome secondary to neuralgic amyotrophy of the lower extremity which is similar to a [place]-Turner type etiology but just in the lumbar plexus.  However this would not explain her clonus.  I will follow-up after her imaging is completed.   Penne MICAEL Sharps, MD/MSCR Dept. of Neurosurgery

## 2024-05-28 ENCOUNTER — Telehealth: Payer: Self-pay | Admitting: Neurosurgery

## 2024-05-28 NOTE — Telephone Encounter (Signed)
 Patient is calling to let our office know that she is having a lot of swelling in her legs and feet. She states that Dr. Claudene saw her when she was in the hospital and was going to consult with Dr. Clois since he performed her surgery. She would like to know if she needs to be seen before 06/14/2024. Please advise.

## 2024-05-31 NOTE — Telephone Encounter (Signed)
 In Dr Elliot inbox to review already.

## 2024-05-31 NOTE — Telephone Encounter (Signed)
 Patient is calling to follow up on what her next step is. She states that she is in a lot of pain and has difficulty walking.

## 2024-06-01 ENCOUNTER — Telehealth: Payer: Self-pay | Admitting: Neurosurgery

## 2024-06-01 NOTE — Telephone Encounter (Signed)
 I spoke to [name].  She is still having severe right leg pain.  We reviewed her imaging.  I will see her in clinic early next week.

## 2024-06-04 NOTE — Telephone Encounter (Signed)
 Patient scheduled.

## 2024-06-05 ENCOUNTER — Ambulatory Visit: Admitting: Neurosurgery

## 2024-06-05 ENCOUNTER — Encounter: Payer: Self-pay | Admitting: Neurosurgery

## 2024-06-05 VITALS — BP 126/76 | Ht 63.0 in | Wt 240.0 lb

## 2024-06-05 DIAGNOSIS — M5126 Other intervertebral disc displacement, lumbar region: Secondary | ICD-10-CM

## 2024-06-05 DIAGNOSIS — Z9889 Other specified postprocedural states: Secondary | ICD-10-CM

## 2024-06-05 MED ORDER — HYDROCODONE-ACETAMINOPHEN 10-325 MG PO TABS: 1.0000 | ORAL_TABLET | ORAL | 0 refills | Status: AC | PRN

## 2024-06-05 NOTE — Progress Notes (Unsigned)
   REFERRING PHYSICIAN:  Valora Lynwood FALCON, Md [address] [place],  [place] [address]  DOS: 04/25/24 Right L4-L5 microdiscectomy  HISTORY OF PRESENT ILLNESS: [name] is status post above surgery.    PHYSICAL EXAMINATION:  General: Patient is well developed, well nourished, calm, collected, and in no apparent distress.   NEUROLOGICAL:  General: In no acute distress.   Awake, alert, oriented to person, place, and time.  Pupils equal round and reactive to light.  Facial tone is symmetric.     Strength:          Side Iliopsoas Quads Hamstring PF DF EHL  R 5 5 4 4 1 2   L 5 5 5 5 5 5    She has pain with above strength testing in right LE.   Incision c/d/I  She has pitting edema in bilateral lower extremities.    ROS (Neurologic):  Negative except as noted above  IMAGING: MRI L spine 05/23/2024  IMPRESSION: 1. Postoperative changes from recent right hemi laminectomy with micro discectomy at L4-5. Postoperative collection at the laminectomy site measures 3.5 x 1.3 x 1.4 cm, likely seroma. Residual moderate to severe canal with right lateral recess stenosis at this level, with the descending right L5 nerve root potentially affected. 2. Mild left greater than right lateral recess stenosis at L3-4 related to disc bulge and facet hypertrophy. 3. Mild degenerative disc bulging and facet hypertrophy at L2-3 and L5-S1 without significant stenosis or impingement.     Electronically Signed   By: Morene Hoard M.D.   On: 05/23/2024 20:13    ASSESSMENT/PLAN:  [name] is doing fair s/p above surgery. She continues with weakness in right foot- some of this is likely pain  mediated. Treatment options reviewed with patient and following plan made:   - I have advised the patient to lift up to 10 pounds until 6 weeks after surgery (follow up with Dr. Clois).  - Reviewed wound care.  - No bending, twisting, or lifting.  - Orders placed for HHPT.   - Continue on current medications including prn norco 10. Refill given. PMP reviewed and is appropriate. Continue prn robaxin .  - She is taking neurontin  300mg  bid and 600mg  at night with no side effects. Will go up to neurontin  300mg  in am, 600mg  at lunch, and 600mg  at night. Reviewed side effects.  - Follow up with cardiology regarding bilateral lower extremity swelling.  - Follow up as scheduled in 4 weeks and prn.   Above plan reviewed with Dr. Clois.   Advised to contact the office if any questions or concerns arise.  Glade Boys PA-C Department of neurosurgery

## 2024-06-07 ENCOUNTER — Telehealth: Payer: Self-pay

## 2024-06-07 NOTE — Telephone Encounter (Signed)
 I spoke with [name] and informed her that we will cancel her appointments with us  and that she will need to be cleared by cardiology to have another surgery. She had an echo today and has a follow up appointment with cardiology on 06/18/24. I have asked her to call me after she sees cardiology if they clear her for surgery.

## 2024-06-07 NOTE — Telephone Encounter (Signed)
 Left message to return call

## 2024-06-07 NOTE — Telephone Encounter (Signed)
-----   Message from Reeves Daisy, MD sent at 06/05/2024  3:20 PM EST ----- Please touch base w her to see if she wants to go for reexploration versus injection

## 2024-06-07 NOTE — Telephone Encounter (Signed)
 I spoke with [name]. She has decided she doesn't want the injection, she wants surgery. However, her cardiologist wants to wait until she swelling is under control. She is having an echo today. She said the cardiologist was maybe going to reach out to Dr Clois. She would be interested in surgery sometime in mid January.  I discussed the above with Dr Clois. He would like her to be cleared by cardiology prior to scheduling surgery. He also said we can cancel her appointment on 06/14/24 and 07/17/24 and schedule new appointments once she has a new surgery date.

## 2024-06-14 ENCOUNTER — Encounter: Admitting: Neurosurgery

## 2024-06-19 ENCOUNTER — Telehealth: Payer: Self-pay | Admitting: Neurosurgery

## 2024-06-19 NOTE — Telephone Encounter (Signed)
 noted

## 2024-06-19 NOTE — Telephone Encounter (Signed)
 [name] with [facility] home [facility] is calling to request verbal orders to continue with Pt of 1x3. Verbal order given by me, okay per Almarie Pouch.

## 2024-06-25 ENCOUNTER — Observation Stay
Admission: EM | Admit: 2024-06-25 | Discharge: 2024-06-27 | Disposition: A | Discharge status: Home / Self Care | Attending: Student | Admitting: Student

## 2024-06-25 ENCOUNTER — Encounter: Payer: Self-pay | Admitting: *Deleted

## 2024-06-25 ENCOUNTER — Other Ambulatory Visit: Payer: Self-pay

## 2024-06-25 ENCOUNTER — Emergency Department

## 2024-06-25 DIAGNOSIS — R0789 Other chest pain: Principal | ICD-10-CM

## 2024-06-25 DIAGNOSIS — I110 Hypertensive heart disease with heart failure: Secondary | ICD-10-CM | POA: Diagnosis not present

## 2024-06-25 DIAGNOSIS — I509 Heart failure, unspecified: Secondary | ICD-10-CM | POA: Diagnosis not present

## 2024-06-25 DIAGNOSIS — Z96653 Presence of artificial knee joint, bilateral: Secondary | ICD-10-CM | POA: Diagnosis not present

## 2024-06-25 DIAGNOSIS — Z86718 Personal history of other venous thrombosis and embolism: Secondary | ICD-10-CM | POA: Diagnosis not present

## 2024-06-25 DIAGNOSIS — R000 Tachycardia, unspecified: Secondary | ICD-10-CM | POA: Diagnosis not present

## 2024-06-25 DIAGNOSIS — R197 Diarrhea, unspecified: Secondary | ICD-10-CM

## 2024-06-25 DIAGNOSIS — E039 Hypothyroidism, unspecified: Secondary | ICD-10-CM | POA: Diagnosis not present

## 2024-06-25 DIAGNOSIS — Z6841 Body Mass Index (BMI) 40.0 and over, adult: Secondary | ICD-10-CM | POA: Insufficient documentation

## 2024-06-25 DIAGNOSIS — Z7901 Long term (current) use of anticoagulants: Secondary | ICD-10-CM | POA: Insufficient documentation

## 2024-06-25 DIAGNOSIS — J45909 Unspecified asthma, uncomplicated: Secondary | ICD-10-CM | POA: Diagnosis not present

## 2024-06-25 DIAGNOSIS — Z79899 Other long term (current) drug therapy: Secondary | ICD-10-CM | POA: Insufficient documentation

## 2024-06-25 DIAGNOSIS — K529 Noninfective gastroenteritis and colitis, unspecified: Secondary | ICD-10-CM | POA: Diagnosis not present

## 2024-06-25 DIAGNOSIS — G20C Parkinsonism, unspecified: Secondary | ICD-10-CM | POA: Insufficient documentation

## 2024-06-25 DIAGNOSIS — F419 Anxiety disorder, unspecified: Secondary | ICD-10-CM | POA: Diagnosis not present

## 2024-06-25 DIAGNOSIS — E860 Dehydration: Secondary | ICD-10-CM | POA: Diagnosis not present

## 2024-06-25 DIAGNOSIS — E876 Hypokalemia: Secondary | ICD-10-CM | POA: Insufficient documentation

## 2024-06-25 DIAGNOSIS — E119 Type 2 diabetes mellitus without complications: Secondary | ICD-10-CM | POA: Insufficient documentation

## 2024-06-25 DIAGNOSIS — Z794 Long term (current) use of insulin: Secondary | ICD-10-CM | POA: Insufficient documentation

## 2024-06-25 DIAGNOSIS — E66813 Obesity, class 3: Secondary | ICD-10-CM | POA: Diagnosis not present

## 2024-06-25 DIAGNOSIS — Z7984 Long term (current) use of oral hypoglycemic drugs: Secondary | ICD-10-CM | POA: Insufficient documentation

## 2024-06-25 DIAGNOSIS — I2510 Atherosclerotic heart disease of native coronary artery without angina pectoris: Secondary | ICD-10-CM | POA: Diagnosis not present

## 2024-06-25 DIAGNOSIS — Z87891 Personal history of nicotine dependence: Secondary | ICD-10-CM | POA: Diagnosis not present

## 2024-06-25 DIAGNOSIS — R112 Nausea with vomiting, unspecified: Principal | ICD-10-CM | POA: Insufficient documentation

## 2024-06-25 DIAGNOSIS — R1013 Epigastric pain: Secondary | ICD-10-CM

## 2024-06-25 DIAGNOSIS — R531 Weakness: Secondary | ICD-10-CM

## 2024-06-25 DIAGNOSIS — G20A1 Parkinson's disease without dyskinesia, without mention of fluctuations: Secondary | ICD-10-CM | POA: Diagnosis not present

## 2024-06-25 LAB — PROTIME-INR
INR: 1.1 (ref 0.8–1.2)
Prothrombin Time: 14.5 s (ref 11.4–15.2)

## 2024-06-25 LAB — CBC
HCT: 41.1 % (ref 36.0–46.0)
Hemoglobin: 12.7 g/dL (ref 12.0–15.0)
MCH: 27.5 pg (ref 26.0–34.0)
MCHC: 30.9 g/dL (ref 30.0–36.0)
MCV: 89 fL (ref 80.0–100.0)
Platelets: 307 K/uL (ref 150–400)
RBC: 4.62 MIL/uL (ref 3.87–5.11)
RDW: 14.5 % (ref 11.5–15.5)
WBC: 15.6 K/uL — ABNORMAL HIGH (ref 4.0–10.5)
nRBC: 0 % (ref 0.0–0.2)

## 2024-06-25 LAB — BASIC METABOLIC PANEL WITH GFR
Anion gap: 15 (ref 5–15)
BUN: 8 mg/dL (ref 8–23)
CO2: 24 mmol/L (ref 22–32)
Calcium: 9.4 mg/dL (ref 8.9–10.3)
Chloride: 101 mmol/L (ref 98–111)
Creatinine, Ser: 0.79 mg/dL (ref 0.44–1.00)
GFR, Estimated: >60 mL/min
Glucose, Bld: 113 mg/dL — ABNORMAL HIGH (ref 70–99)
Potassium: 3.8 mmol/L (ref 3.5–5.1)
Sodium: 140 mmol/L (ref 135–145)

## 2024-06-25 LAB — TROPONIN T, HIGH SENSITIVITY
Troponin T High Sensitivity: 18 ng/L (ref 0–19)
Troponin T High Sensitivity: 18 ng/L (ref 0–19)

## 2024-06-25 MED ORDER — ONDANSETRON 8 MG PO TBDP: 8.0000 mg | ORAL_TABLET | Freq: Three times a day (TID) | ORAL | 0 refills | Status: AC | PRN

## 2024-06-25 MED ORDER — ONDANSETRON HCL 4 MG/2ML IJ SOLN
4.0000 mg | Freq: Once | INTRAMUSCULAR | Status: AC
  Administered 2024-06-25: 4 mg via INTRAVENOUS
  Filled 2024-06-25: qty 2

## 2024-06-25 NOTE — ED Triage Notes (Addendum)
 Pt brought in  via ems from home with left side chest pain and abd pain.  Sx for 2 days.  Pt has nausea and vomiting with diarrhea.  No sob.  Pt alert  speech clear.   Iv in place in left forearm.

## 2024-06-25 NOTE — ED Provider Notes (Signed)
 "  [facility] Provider Note   Event Date/Time   First MD Initiated Contact with Patient 06/25/24 2132     (approximate) History  Chest Pain  HPI [name] is a 68 y.o. female with a past medical history of hypertension, depression/anxiety close hyperlipidemia, CAD, Parkinson's disease, fibromyalgia, type 2 diabetes, morbid obesity, hypothyroidism, CHF who presents complaining of left upper chest wall pain and epigastric abdominal pain that been present for the last 2 days.  Patient states she has been having nausea/vomiting/diarrhea.  Patient denies any shortness of breath or cough.  Patient denies any sore throat, recent travel, or sick contacts.  Patient brought in via EMS ROS: Patient currently denies any vision changes, tinnitus, difficulty speaking, facial droop, sore throat, shortness of breath, dysuria, or weakness/numbness/paresthesias in any extremity   Physical Exam  Triage Vital Signs: ED Triage Vitals [06/25/24 1547]  Encounter Vitals Group     BP (!) 163/107     Girls Systolic BP Percentile      Girls Diastolic BP Percentile      Boys Systolic BP Percentile      Boys Diastolic BP Percentile      Pulse Rate 88     Resp 20     Temp 98 F (36.7 C)     Temp Source Oral     SpO2 96 %     Weight 240 lb (108.9 kg)     Height 5' 4 (1.626 m)     Head Circumference      Peak Flow      Pain Score 8     Pain Loc      Pain Education      Exclude from Growth Chart    Most recent vital signs: Vitals:   06/25/24 1547 06/25/24 1935  BP: (!) 163/107 (!) 140/76  Pulse: 88 95  Resp: 20 19  Temp: 98 F (36.7 C) 98.7 F (37.1 C)  SpO2: 96% 97%   General: Awake, oriented x4. CV:  Good peripheral perfusion. Resp:  Normal effort. Abd:  No distention. Other:  Elderly obese Caucasian female resting comfortably in no acute distress ED Results / Procedures / Treatments  Labs (all labs ordered are listed, but only abnormal results are displayed) Labs  Reviewed  BASIC METABOLIC PANEL WITH GFR - Abnormal; Notable for the following components:      Result Value   Glucose, Bld 113 (*)    All other components within normal limits  CBC - Abnormal; Notable for the following components:   WBC 15.6 (*)    All other components within normal limits  PROTIME-INR  TROPONIN T, HIGH SENSITIVITY  TROPONIN T, HIGH SENSITIVITY   EKG ED ECG REPORT I, Artist MARLA Kerns, the attending physician, personally viewed and interpreted this ECG. Date: 06/25/2024 EKG Time: 1601 Rate: 87 Rhythm: Irregular baseline.  Question computer read of atrial flutter.  Seems to be normal sinus rhythm QRS Axis: normal Intervals: normal ST/T Wave abnormalities: normal Narrative Interpretation: no evidence of acute ischemia RADIOLOGY ED MD interpretation: 2 view chest x-ray interpreted by me shows no evidence of acute abnormalities including no pneumonia, pneumothorax, or widened mediastinum - All radiology independently interpreted and agree with radiology assessment Official radiology report(s): DG Chest 2 View Result Date: 06/25/2024 CLINICAL DATA:  Chest pain. EXAM: CHEST - 2 VIEW COMPARISON:  05/23/2024.  CT 09/06/2016 FINDINGS: Stable heart size and mediastinal contours. Prominent left epicardial fat pad. No acute airspace disease, pulmonary edema, pleural  effusion or pneumothorax. Thoracic spondylosis. IMPRESSION: No acute chest findings. Electronically Signed   By: Andrea Gasman M.D.   On: 06/25/2024 17:51   PROCEDURES: Critical Care performed: No Procedures MEDICATIONS ORDERED IN ED: Medications - No data to display IMPRESSION / MDM / ASSESSMENT AND PLAN / ED COURSE  I reviewed the triage vital signs and the nursing notes.                             The patient is on the cardiac monitor to evaluate for evidence of arrhythmia and/or significant heart rate changes. Patient's presentation is most consistent with acute presentation with potential threat to  life or bodily function. Patient is a 68 year old female with the above-stated past medical history who presents complaining of left upper chest wall pain, epigastric pain, and nausea/vomiting/diarrhea. DDx: Small bowel obstruction, appendicitis, biliary disease, pancreatitis, ACS, pneumonia Plan: CBC, CMP, lipase, INR, troponin, chest x-ray, EKG  Care of this patient will be signed out to the oncoming physician at the end of my shift.  All pertinent patient information conveyed and all questions answered.  All further care and disposition decisions will be made by the oncoming physician. Clinical Course as of 06/25/24 2327  Mon Jun 25, 2024  2326 S/o from Dr. Jossie - 6F hx ab pain, n/v - ab benign - LFTs pending  TO DO: - f/u LFTs, re-eval [MM]    Clinical Course User Index [MM] Clarine Ozell LABOR, MD   FINAL CLINICAL IMPRESSION(S) / ED DIAGNOSES   Final diagnoses:  None   Rx / DC Orders   ED Discharge Orders     None      Note:  This document was prepared using Dragon voice recognition software and may include unintentional dictation errors.   Jossie Artist POUR, MD 06/25/24 [phone]  "

## 2024-06-25 NOTE — ED Provider Notes (Signed)
" °  Physical Exam  BP (!) 167/74   Pulse 90   Temp 98.3 F (36.8 C)   Resp 16   Ht 5' 4 (1.626 m)   Wt 108.9 kg   LMP  (LMP Unknown)   SpO2 96%   BMI 41.20 kg/m   Physical Exam  Procedures  Procedures  ED Course / MDM   Clinical Course as of 06/26/24 0739  Mon Jun 25, 2024  2326 S/o from Dr. Jossie - 51F hx ab pain, n/v - ab benign - LFTs pending  TO DO: - f/u LFTs, re-eval [MM]  Tue Jun 26, 2024  0119 Patient reevaluated, does have notable tenderness in left upper and lower quadrants, will add CT abdomen pelvis  Has had some vomiting at home, not able to tolerate p.o. meds over the past 3 days.  Has also been having diarrhea over the past 3 days.  Mild tachycardia on my reeval feels warm to touch, temperature 99.8--do suspect low fever.  Will add urinalysis as well as viral swab in addition.  Will give IV fluid and Tylenol  and reassess.  Low threshold for admission. [MM]  0317 LFTs wnl [MM]  0357 CTAP: IMPRESSION: 1. No acute findings in the abdomen or pelvis. 2. Intraluminal colonic lipomas in the transverse and descending colon, unchanged from prior examination, without obstruction. 3. Mild sigmoid diverticulosis without superimposed acute inflammatory change. 4. Moderate hepatic steatosis 5. Raf score includes aortic atherosclerosis ([id number]).   [MM]  0357 Viral swab negative [MM]  [phone] Patient attempted to provide urine sample, was too weak to get up off the toilet.  Has had several rounds of diarrhea here and does not feel safe to return home.  Will discuss with hospitalist regarding admission. [MM]    Clinical Course User Index [MM] Clarine Ozell LABOR, MD   Medical Decision Making Amount and/or Complexity of Data Reviewed Labs: ordered. Radiology: ordered.  Risk OTC drugs. Prescription drug management. Decision regarding hospitalization.          Clarine Ozell LABOR, MD 06/26/24 [phone]  "

## 2024-06-26 ENCOUNTER — Encounter: Payer: Self-pay | Admitting: Internal Medicine

## 2024-06-26 ENCOUNTER — Emergency Department

## 2024-06-26 DIAGNOSIS — K529 Noninfective gastroenteritis and colitis, unspecified: Secondary | ICD-10-CM | POA: Insufficient documentation

## 2024-06-26 DIAGNOSIS — R112 Nausea with vomiting, unspecified: Secondary | ICD-10-CM | POA: Diagnosis present

## 2024-06-26 LAB — URINALYSIS, COMPLETE (UACMP) WITH MICROSCOPIC
Bilirubin Urine: NEGATIVE
Glucose, UA: NEGATIVE mg/dL
Hgb urine dipstick: NEGATIVE
Ketones, ur: 5 mg/dL — AB
Leukocytes,Ua: NEGATIVE
Nitrite: POSITIVE — AB
Protein, ur: NEGATIVE mg/dL
Specific Gravity, Urine: 1.023 (ref 1.005–1.030)
pH: 5 (ref 5.0–8.0)

## 2024-06-26 LAB — HEPATIC FUNCTION PANEL
ALT: 14 U/L (ref 0–44)
AST: 25 U/L (ref 15–41)
Albumin: 4.3 g/dL (ref 3.5–5.0)
Alkaline Phosphatase: 95 U/L (ref 38–126)
Bilirubin, Direct: 0.2 mg/dL (ref 0.0–0.2)
Indirect Bilirubin: 0.3 mg/dL (ref 0.3–0.9)
Total Bilirubin: 0.4 mg/dL (ref 0.0–1.2)
Total Protein: 7.2 g/dL (ref 6.5–8.1)

## 2024-06-26 LAB — HEMOGLOBIN A1C
Hgb A1c MFr Bld: 6.7 % — ABNORMAL HIGH (ref 4.8–5.6)
Mean Plasma Glucose: 145.59 mg/dL

## 2024-06-26 LAB — LIPASE, BLOOD: Lipase: 22 U/L (ref 11–51)

## 2024-06-26 LAB — RESP PANEL BY RT-PCR (RSV, FLU A&B, COVID)  RVPGX2
Influenza A by PCR: NEGATIVE
Influenza B by PCR: NEGATIVE
Resp Syncytial Virus by PCR: NEGATIVE
SARS Coronavirus 2 by RT PCR: NEGATIVE

## 2024-06-26 LAB — LACTIC ACID, PLASMA: Lactic Acid, Venous: 1.1 mmol/L (ref 0.5–1.9)

## 2024-06-26 LAB — GLUCOSE, CAPILLARY
Glucose-Capillary: 144 mg/dL — ABNORMAL HIGH (ref 70–99)
Glucose-Capillary: 105 mg/dL — ABNORMAL HIGH (ref 70–99)

## 2024-06-26 LAB — CBG MONITORING, ED: Glucose-Capillary: 103 mg/dL — ABNORMAL HIGH (ref 70–99)

## 2024-06-26 LAB — HIV ANTIBODY (ROUTINE TESTING W REFLEX): HIV Screen 4th Generation wRfx: NONREACTIVE

## 2024-06-26 MED ORDER — SODIUM CHLORIDE 0.9 % IV BOLUS
1000.0000 mL | Freq: Once | INTRAVENOUS | Status: AC
  Administered 2024-06-26: 1000 mL via INTRAVENOUS

## 2024-06-26 MED ORDER — GABAPENTIN 300 MG PO CAPS
300.0000 mg | ORAL_CAPSULE | Freq: Every day | ORAL | Status: DC
  Administered 2024-06-27: 300 mg via ORAL
  Filled 2024-06-26: qty 1

## 2024-06-26 MED ORDER — GABAPENTIN 300 MG PO CAPS
600.0000 mg | ORAL_CAPSULE | ORAL | Status: DC
  Administered 2024-06-26 – 2024-06-27 (×3): 600 mg via ORAL
  Filled 2024-06-26 (×3): qty 2

## 2024-06-26 MED ORDER — APIXABAN 5 MG PO TABS
5.0000 mg | ORAL_TABLET | Freq: Two times a day (BID) | ORAL | Status: DC
  Administered 2024-06-26 – 2024-06-27 (×3): 5 mg via ORAL
  Filled 2024-06-26 (×3): qty 1

## 2024-06-26 MED ORDER — SODIUM CHLORIDE 0.9 % IV SOLN: INTRAVENOUS | Status: DC

## 2024-06-26 MED ORDER — ONDANSETRON HCL 4 MG PO TABS: 4.0000 mg | ORAL_TABLET | Freq: Four times a day (QID) | ORAL | Status: DC | PRN

## 2024-06-26 MED ORDER — BUPROPION HCL ER (XL) 150 MG PO TB24
150.0000 mg | ORAL_TABLET | Freq: Every morning | ORAL | Status: DC
  Administered 2024-06-26 – 2024-06-27 (×2): 150 mg via ORAL
  Filled 2024-06-26 (×2): qty 1

## 2024-06-26 MED ORDER — LOPERAMIDE HCL 2 MG PO CAPS: 2.0000 mg | ORAL_CAPSULE | ORAL | Status: DC | PRN

## 2024-06-26 MED ORDER — PANTOPRAZOLE SODIUM 40 MG PO TBEC
40.0000 mg | DELAYED_RELEASE_TABLET | Freq: Every day | ORAL | Status: DC
  Administered 2024-06-26: 40 mg via ORAL
  Filled 2024-06-26: qty 1

## 2024-06-26 MED ORDER — HYDROMORPHONE HCL 1 MG/ML IJ SOLN: 0.5000 mg | INTRAMUSCULAR | Status: DC | PRN

## 2024-06-26 MED ORDER — ACETAMINOPHEN 500 MG PO TABS
1000.0000 mg | ORAL_TABLET | Freq: Once | ORAL | Status: AC
  Administered 2024-06-26: 1000 mg via ORAL
  Filled 2024-06-26: qty 2

## 2024-06-26 MED ORDER — CITALOPRAM HYDROBROMIDE 20 MG PO TABS
20.0000 mg | ORAL_TABLET | Freq: Every day | ORAL | Status: DC
  Administered 2024-06-26 – 2024-06-27 (×2): 20 mg via ORAL
  Filled 2024-06-26 (×2): qty 1

## 2024-06-26 MED ORDER — ACETAMINOPHEN 500 MG PO TABS: 1000.0000 mg | ORAL_TABLET | Freq: Once | ORAL | Status: DC

## 2024-06-26 MED ORDER — POLYETHYLENE GLYCOL 3350 17 G PO PACK: 17.0000 g | PACK | Freq: Every day | ORAL | Status: DC | PRN

## 2024-06-26 MED ORDER — ACETAMINOPHEN 650 MG RE SUPP: 650.0000 mg | Freq: Four times a day (QID) | RECTAL | Status: DC | PRN

## 2024-06-26 MED ORDER — CARBIDOPA-LEVODOPA 25-100 MG PO TABS
2.0000 | ORAL_TABLET | ORAL | Status: DC
  Administered 2024-06-26 – 2024-06-27 (×4): 2 via ORAL
  Filled 2024-06-26 (×5): qty 2

## 2024-06-26 MED ORDER — IRBESARTAN 150 MG PO TABS
300.0000 mg | ORAL_TABLET | Freq: Every day | ORAL | Status: DC
  Administered 2024-06-26 – 2024-06-27 (×2): 300 mg via ORAL
  Filled 2024-06-26 (×3): qty 2

## 2024-06-26 MED ORDER — HYDRALAZINE HCL 20 MG/ML IJ SOLN
5.0000 mg | Freq: Four times a day (QID) | INTRAMUSCULAR | Status: DC | PRN
  Administered 2024-06-26: 5 mg via INTRAVENOUS
  Filled 2024-06-26: qty 1

## 2024-06-26 MED ORDER — IOHEXOL 350 MG/ML SOLN
100.0000 mL | Freq: Once | INTRAVENOUS | Status: AC | PRN
  Administered 2024-06-26: 100 mL via INTRAVENOUS

## 2024-06-26 MED ORDER — SPIRONOLACTONE 25 MG PO TABS
25.0000 mg | ORAL_TABLET | Freq: Every morning | ORAL | Status: DC
  Administered 2024-06-26 – 2024-06-27 (×2): 25 mg via ORAL
  Filled 2024-06-26 (×2): qty 1

## 2024-06-26 MED ORDER — ONDANSETRON HCL 4 MG/2ML IJ SOLN: 4.0000 mg | Freq: Four times a day (QID) | INTRAMUSCULAR | Status: DC | PRN

## 2024-06-26 MED ORDER — AMLODIPINE BESYLATE 10 MG PO TABS: 10.0000 mg | ORAL_TABLET | Freq: Every day | ORAL | Status: DC

## 2024-06-26 MED ORDER — ACETAMINOPHEN 325 MG PO TABS
650.0000 mg | ORAL_TABLET | Freq: Four times a day (QID) | ORAL | Status: DC | PRN
  Administered 2024-06-26: 650 mg via ORAL
  Filled 2024-06-26: qty 2

## 2024-06-26 MED ORDER — ZOLPIDEM TARTRATE 5 MG PO TABS
5.0000 mg | ORAL_TABLET | Freq: Every evening | ORAL | Status: DC | PRN
  Administered 2024-06-26: 5 mg via ORAL
  Filled 2024-06-26: qty 1

## 2024-06-26 MED ORDER — INSULIN ASPART 100 UNIT/ML IJ SOLN
0.0000 [IU] | Freq: Three times a day (TID) | INTRAMUSCULAR | Status: DC
  Administered 2024-06-26: 2 [IU] via SUBCUTANEOUS
  Administered 2024-06-27: 3 [IU] via SUBCUTANEOUS
  Filled 2024-06-26: qty 1
  Filled 2024-06-26: qty 2

## 2024-06-26 MED ORDER — IRBESARTAN 150 MG PO TABS
300.0000 mg | ORAL_TABLET | Freq: Every day | ORAL | Status: DC
  Filled 2024-06-26: qty 2

## 2024-06-26 NOTE — ED Notes (Signed)
 Assisted pt back to bed from in room toilet. Pt was able to ambulate with minimal assistance from staff. Pt tolerated activity well. Bed is in the lowest, locked position with call bell in reach.

## 2024-06-26 NOTE — H&P (Signed)
 " History and Physical    [name] FMW:[id number] DOB: 06-20-1956 DOA: 06/25/2024  PCP: Valora Lynwood FALCON, MD (Confirm with patient/family/NH records and if not entered, this has to be entered at [facility] point of entry) Patient coming from: Home  I have personally briefly reviewed patient's old medical records in [facility]  Chief Complaint: Nauseous vomiting abdominal pain diarrhea  HPI: [name] is a 68 y.o. female with medical history significant of recently diagnosed DVT on Eliquis , nonobstructive CAD, Parkinson's disease, HTN, HLD, IDDM, diabetic neuropathy, morbid obesity, presented with worsening of nauseous vomiting abdominal pain and diarrhea.  Symptoms started 2 days ago when patient started to have cramping-like epigastric abdominal pain, progressively getting worse associated with vomiting of stomach content and watery diarrhea 5-10 times a day.  Denies any blood or mucus in the diarrhea negative for tenesmus.  No fever or chills.  She has not been eating much since yesterday for her GI symptoms. ED Course: Afebrile, no tachycardia blood pressure 160/70 O2 saturation 97% on room air.  CT abdomen pelvis negative for acute findings, blood work showed WBC 15.6 hemoglobin 12.7 BUN 8 creatinine 0.7 bicarb 24, AST 25 ALT 14.  ED tried several rounds of IV morphine  and Zofran  patient remains symptomatic and unable to tolerate p.o.  Review of Systems: As per HPI otherwise 14 point review of systems negative.    Past Medical History:  Diagnosis Date   Anemia    Anxiety    a.) on BZO (diazepam) PRN   Aortic atherosclerosis    Arthritis    Asthma    Benign essential tremor    Bilateral thoracic back pain    CAD (coronary artery disease)    a.) MV 01/01/2019: EF 47%; reversible ant ischemia; b.) cCTA 01/18/2019: Ca2+ = 200; < 25% p-m LAD, diag, LCx, oRCA, p-dRCA - FFR CT analysis: LAD 0.87, LCx 0.95, RCA 0.91.   Cerebral microvascular disease    Claustrophobia    Complication  of anesthesia    a.) delayed emergence; b.) PONV   Depression    Diastolic dysfunction 02/16/2017   a.) TTE 02/16/2017: EF >55%, triv MR/PR, mild TR, G1DD; b.) TTE 02/13/2019: EF 45%, mild LVH, septal and apical HK, triv MR, mild TR.   Dyspnea    Family history of adverse reaction to anesthesia    a.) delayed emergence in 1st degree female relative (son)   Fibromyalgia    Fusion of spine of cervical region    Gastric ulcer    GERD (gastroesophageal reflux disease)    Hemorrhoids    Hepatic steatosis    History of motion sickness    History of recent fall 04/20/2024   History of seasonal allergies    History of sepsis    Hyperlipidemia    Hypertension    Hypothyroidism    Leukocytosis    Migraines    Obesity    Palpitations    Parkinson disease (HCC)    Plantar fasciitis (left) s/p release    PONV (postoperative nausea and vomiting)    Proliferative diabetic retinopathy (HCC)    Snores    a.) has had negative PSG testing   Spinal stenosis of cervical region with radiculopathy    a.) s/p ACDF C4-C7)   Spinal stenosis of lumbar region with radiculopathy    Spondylosis of lumbar spine without myelopathy    Statin myopathy    Status post carpal tunnel release of both wrists    Type 2 diabetes  mellitus treated with insulin  [facility])     Past Surgical History:  Procedure Laterality Date   ACDF C4-C7 WITH ALLOGRAFT INSERTION N/A 11/29/2012   ACHILLES TENDON LENGTHENING Bilateral 2010   ARTHRODESIS ANTERIOR CERVICAL SPINE N/A 12/04/2012   BREAST BIOPSY Bilateral 1970's   BREAST BIOPSY Left    stereotactic   CARPAL TUNNEL RELEASE Bilateral    CESAREAN SECTION     COLONOSCOPY WITH PROPOFOL  N/A 04/14/2016   Procedure: COLONOSCOPY WITH PROPOFOL ;  Surgeon: Lamar ONEIDA Holmes, MD;  Location: [facility] ENDOSCOPY;  Service: Endoscopy;  Laterality: N/A;   COLONOSCOPY WITH PROPOFOL  N/A 02/15/2020   Procedure: COLONOSCOPY WITH PROPOFOL ;  Surgeon: Maryruth Ole ONEIDA, MD;  Location: [facility]  ENDOSCOPY;  Service: Endoscopy;  Laterality: N/A;   ESOPHAGOGASTRODUODENOSCOPY (EGD) WITH PROPOFOL  N/A 04/14/2016   Procedure: ESOPHAGOGASTRODUODENOSCOPY (EGD) WITH PROPOFOL ;  Surgeon: Lamar ONEIDA Holmes, MD;  Location: [facility] ENDOSCOPY;  Service: Endoscopy;  Laterality: N/A;   ESOPHAGOGASTRODUODENOSCOPY (EGD) WITH PROPOFOL  N/A 02/15/2020   Procedure: ESOPHAGOGASTRODUODENOSCOPY (EGD) WITH PROPOFOL ;  Surgeon: Maryruth Ole ONEIDA, MD;  Location: [facility] ENDOSCOPY;  Service: Endoscopy;  Laterality: N/A;   FLEXIBLE SIGMOIDOSCOPY N/A 11/03/2016   Procedure: FLEXIBLE SIGMOIDOSCOPY;  Surgeon: Holmes Lamar ONEIDA, MD;  Location: [facility] ENDOSCOPY;  Service: Endoscopy;  Laterality: N/A;   FLEXIBLE SIGMOIDOSCOPY N/A 01/31/2017   Procedure: FLEXIBLE SIGMOIDOSCOPY;  Surgeon: Holmes Lamar ONEIDA, MD;  Location: [facility] ENDOSCOPY;  Service: Endoscopy;  Laterality: N/A;   LUMBAR LAMINECTOMY/DECOMPRESSION MICRODISCECTOMY Right 04/25/2024   Procedure: LUMBAR LAMINECTOMY/DECOMPRESSION MICRODISCECTOMY 1 LEVEL;  Surgeon: Clois Fret, MD;  Location: [facility] ORS;  Service: Neurosurgery;  Laterality: Right;  RIGHT L4/5 MICRODISCECTOMY   NERVE SURGERY     on back   PLANTAR FASCIA SURGERY Left    SHOULDER ARTHROSCOPY Right    TONSILLECTOMY     TOTAL KNEE ARTHROPLASTY Right 11/27/2013   TOTAL KNEE ARTHROPLASTY Left 01/04/2022   Procedure: TOTAL KNEE ARTHROPLASTY;  Surgeon: Leora Lynwood SAUNDERS, MD;  Location: [facility] ORS;  Service: Orthopedics;  Laterality: Left;     reports that she quit smoking about 24 years ago. Her smoking use included cigarettes. She started smoking about 59 years ago. She has a 70 pack-year smoking history. She has never used smokeless tobacco. She reports that she does not drink alcohol and does not use drugs.  Allergies[1]  Family History  Problem Relation Age of Onset   Breast cancer Mother 59   Hypertension Father    Cancer Sister        Hodkin's lymphoma    Prior to Admission medications  Medication  Sig Start Date End Date Taking? Authorizing Provider  apixaban  (ELIQUIS ) 5 MG TABS tablet Take 1 tablet (5 mg total) by mouth 2 (two) times daily. 05/10/24  Yes Claudene Rover, MD  buPROPion  (WELLBUTRIN  XL) 150 MG 24 hr tablet Take 150 mg by mouth in the morning.   Yes [provider]  carbidopa -levodopa  (SINEMET  IR) 25-100 MG tablet Take 2 tablets by mouth in the morning, at noon, and at bedtime.   Yes [provider]  citalopram  (CELEXA ) 20 MG tablet Take 20 mg by mouth at bedtime. 11/07/20  Yes [provider]  dapagliflozin propanediol (FARXIGA) 10 MG TABS tablet Take 10 mg by mouth daily. 06/08/24 06/08/25 Yes [provider]  gabapentin  (NEURONTIN ) 300 MG capsule 300mg  po q morning, 600mg  po at lunch, 600mg  q hs. 05/08/24  Yes Hilma Hastings, PA-C  insulin  degludec (TRESIBA) 100 UNIT/ML FlexTouch Pen Inject 10 Units into the skin at bedtime. 10/26/23  Yes  [provider]  ondansetron  (ZOFRAN -ODT) 8 MG disintegrating tablet Take 1 tablet (8 mg total) by mouth every 8 (eight) hours as needed for nausea or vomiting. 06/25/24  Yes Bradler, Evan K, MD  polyethylene glycol powder (SMOOTH LAX) 17 GM/SCOOP powder Take 17 g by mouth daily as needed. Dissolve 1 capful (17g) in 4-8 ounces of liquid and take by mouth daily. 04/25/24  Yes Gregory Edsel Ruth, PA  potassium chloride SA (KLOR-CON M) 20 MEQ tablet Take 20 mEq by mouth 2 (two) times daily. 05/28/24 05/28/25 Yes [provider]  senna (SENOKOT) 8.6 MG TABS tablet Take 1 tablet (8.6 mg total) by mouth 2 (two) times daily as needed. 04/25/24  Yes Gregory Edsel Ruth, PA  spironolactone  (ALDACTONE ) 25 MG tablet Take 25 mg by mouth in the morning. 12/10/21  Yes [provider]  torsemide (DEMADEX) 20 MG tablet Take 20 mg by mouth daily. 05/28/24 05/28/25 Yes [provider]  Vitamin D , Ergocalciferol , (DRISDOL ) 1.25 MG (50000 UNIT) CAPS capsule Take 50,000 Units by mouth once a week. 06/23/24   Yes [provider]  amLODipine  (NORVASC ) 10 MG tablet Take 10 mg by mouth at bedtime. Patient not taking: Reported on 06/26/2024 12/11/20   [provider]  APIXABAN  (ELIQUIS ) VTE STARTER PACK (10MG  AND 5MG ) Take as directed on package: start with two-5mg  tablets twice daily for 7 days. On day 8, switch to one-5mg  tablet twice daily. Patient not taking: Reported on 06/26/2024 05/10/24   Claudene Rover, MD  diazepam (VALIUM) 5 MG tablet Take 5 mg by mouth daily as needed for anxiety. Patient not taking: Reported on 06/26/2024    [provider]  methocarbamol  (ROBAXIN ) 500 MG tablet Take 1 tablet (500 mg total) by mouth every 6 (six) hours as needed for muscle spasms. Patient not taking: Reported on 06/26/2024 04/25/24   Gregory Edsel Ruth, PA  olmesartan (BENICAR) 40 MG tablet Take 40 mg by mouth 2 (two) times daily. 09/25/20 04/20/27  [provider]  pantoprazole  (PROTONIX ) 40 MG tablet Take 40 mg by mouth at bedtime. 04/01/15   [provider]  pioglitazone (ACTOS) 30 MG tablet Take 30 mg by mouth in the morning. Patient not taking: Reported on 06/26/2024    [provider]    Physical Exam: Vitals:   06/26/24 0445 06/26/24 0500 06/26/24 0515 06/26/24 0751  BP:    (!) 174/61  Pulse: 66 73 90 72  Resp:    16  Temp:    98.5 F (36.9 C)  TempSrc:    Oral  SpO2: 94% 97% 96% 97%  Weight:      Height:        Constitutional: NAD, calm, comfortable Vitals:   06/26/24 0445 06/26/24 0500 06/26/24 0515 06/26/24 0751  BP:    (!) 174/61  Pulse: 66 73 90 72  Resp:    16  Temp:    98.5 F (36.9 C)  TempSrc:    Oral  SpO2: 94% 97% 96% 97%  Weight:      Height:       Eyes: PERRL, lids and conjunctivae normal ENMT: Mucous membranes are dry. Posterior pharynx clear of any exudate or lesions.Normal dentition.  Neck: normal, supple, no masses, no thyromegaly Respiratory: clear to auscultation bilaterally, no wheezing, no crackles. Normal  respiratory effort. No accessory muscle use.  Cardiovascular: Regular rate and rhythm, no murmurs / rubs / gallops. No extremity edema. 2+ pedal pulses. No carotid bruits.  Abdomen: Mild tenderness on periumbilical  area, no rebound or guarding, no masses palpated. No hepatosplenomegaly. Bowel sounds positive.  Musculoskeletal: no clubbing / cyanosis. No joint deformity upper and lower extremities. Good ROM, no contractures. Normal muscle tone.  Skin: no rashes, lesions, ulcers. No induration Neurologic: CN 2-12 grossly intact. Sensation intact, DTR normal. Strength 5/5 in all 4.  Psychiatric: Normal judgment and insight. Alert and oriented x 3. Normal mood.     Labs on Admission: I have personally reviewed following labs and imaging studies  CBC: Recent Labs  Lab 06/25/24 1551  WBC 15.6*  HGB 12.7  HCT 41.1  MCV 89.0  PLT 307   Basic Metabolic Panel: Recent Labs  Lab 06/25/24 1551  NA 140  K 3.8  CL 101  CO2 24  GLUCOSE 113*  BUN 8  CREATININE 0.79  CALCIUM 9.4   GFR: Estimated Creatinine Clearance: 78.9 mL/min (by C-G formula based on SCr of 0.79 mg/dL). Liver Function Tests: Recent Labs  Lab 06/25/24 1802  AST 25  ALT 14  ALKPHOS 95  BILITOT 0.4  PROT 7.2  ALBUMIN 4.3   Recent Labs  Lab 06/25/24 1802  LIPASE 22   No results for input(s): AMMONIA in the last 168 hours. Coagulation Profile: Recent Labs  Lab 06/25/24 1551  INR 1.1   Cardiac Enzymes: No results for input(s): CKTOTAL, CKMB, CKMBINDEX, TROPONINI in the last 168 hours. BNP (last 3 results) Recent Labs    05/10/24 1853 05/23/24 1153  PROBNP 111.0 190.0   HbA1C: No results for input(s): HGBA1C in the last 72 hours. CBG: No results for input(s): GLUCAP in the last 168 hours. Lipid Profile: No results for input(s): CHOL, HDL, LDLCALC, TRIG, CHOLHDL, LDLDIRECT in the last 72 hours. Thyroid  Function Tests: No results for input(s): TSH, T4TOTAL, FREET4,  T3FREE, THYROIDAB in the last 72 hours. Anemia Panel: No results for input(s): VITAMINB12, FOLATE, FERRITIN, TIBC, IRON, RETICCTPCT in the last 72 hours. Urine analysis:    Component Value Date/Time   COLORURINE YELLOW (A) 05/23/2024 1153   APPEARANCEUR CLEAR (A) 05/23/2024 1153   APPEARANCEUR Clear 11/14/2013 0918   LABSPEC 1.012 05/23/2024 1153   LABSPEC 1.023 11/14/2013 0918   PHURINE 8.0 05/23/2024 1153   GLUCOSEU NEGATIVE 05/23/2024 1153   GLUCOSEU Negative 11/14/2013 0918   HGBUR NEGATIVE 05/23/2024 1153   BILIRUBINUR NEGATIVE 05/23/2024 1153   BILIRUBINUR neg 11/01/2019 1051   BILIRUBINUR Negative 11/14/2013 0918   KETONESUR NEGATIVE 05/23/2024 1153   PROTEINUR NEGATIVE 05/23/2024 1153   NITRITE NEGATIVE 05/23/2024 1153   LEUKOCYTESUR NEGATIVE 05/23/2024 1153   LEUKOCYTESUR Negative 11/14/2013 0918    Radiological Exams on Admission: CT ABDOMEN PELVIS W CONTRAST Result Date: 06/26/2024 EXAM: CT ABDOMEN AND PELVIS WITH CONTRAST 06/26/2024 02:21:36 AM TECHNIQUE: CT of the abdomen and pelvis was performed with the administration of 100 mL of iohexol  (OMNIPAQUE ) 350 MG/ML injection. Multiplanar reformatted images are provided for review. Automated exposure control, iterative reconstruction, and/or weight-based adjustment of the mA/kV was utilized to reduce the radiation dose to as low as reasonably achievable. COMPARISON: 06/26/2022. CLINICAL HISTORY: Abdominal pain, vomiting, diarrhea. FINDINGS: LOWER CHEST: Mild right basilar parenchymal scarring. LIVER: Moderate hepatic steatosis. No intrahepatic mass. No intrahepatic biliary ductal dilation. A cyst is present within the left hepatic lobe. Small focus of portal venous shunting within the inferior right hepatic lobe (series 39, image 2). GALLBLADDER AND BILE DUCTS: Gallbladder is unremarkable. No biliary ductal dilatation. SPLEEN: No acute abnormality. PANCREAS: No acute abnormality. ADRENAL GLANDS: No acute  abnormality. KIDNEYS, URETERS AND  BLADDER: A simple cortical cyst is seen within the upper pole of the right kidney, better assessed on renal sonogram of 12/12/2020. Follow-up imaging is recommended. Per consensus, no follow-up is needed for simple Bosniak type 1 and 2 renal cysts, unless the patient has a malignancy history or risk factors. The kidneys are otherwise unremarkable. No stones in the kidneys or ureters. No hydronephrosis. No perinephric or periureteral stranding. Urinary bladder is unremarkable. GI AND BOWEL: Stomach demonstrates no acute abnormality. 2 polypoid intraluminal masses are seen within the transverse colon, measuring 1.9 x 6.0 cm (and within the descending colon, measuring roughly 9.0 x 2.6 cm, compatible with pedunculated intraluminal lipomas. . This appears unchanged from prior examination. No evidence of obstruction. Mild sigmoid diverticulosis without superimposed acute inflammatory change. The small bowel is unremarkable. Appendix normal. PERITONEUM AND RETROPERITONEUM: No ascites. No free air. VASCULATURE: Aorta is normal in caliber. Moderate aortoiliac atherosclerotic calcification. LYMPH NODES: No lymphadenopathy. REPRODUCTIVE ORGANS: No acute abnormality. BONES AND SOFT TISSUES: Osseous structures are age appropriate. No acute bone abnormality. No lytic or blastic bone lesion. No focal soft tissue abnormality. IMPRESSION: 1. No acute findings in the abdomen or pelvis. 2. Intraluminal colonic lipomas in the transverse and descending colon, unchanged from prior examination, without obstruction. 3. Mild sigmoid diverticulosis without superimposed acute inflammatory change. 4. Moderate hepatic steatosis 5. Raf score includes aortic atherosclerosis ([id number]-i70.0). Electronically signed by: Dorethia Molt MD 06/26/2024 03:53 AM EST RP Workstation: [id number]   DG Chest 2 View Result Date: 06/25/2024 CLINICAL DATA:  Chest pain. EXAM: CHEST - 2 VIEW COMPARISON:  05/23/2024.  CT  09/06/2016 FINDINGS: Stable heart size and mediastinal contours. Prominent left epicardial fat pad. No acute airspace disease, pulmonary edema, pleural effusion or pneumothorax. Thoracic spondylosis. IMPRESSION: No acute chest findings. Electronically Signed   By: Andrea Gasman M.D.   On: 06/25/2024 17:51    EKG: Independently reviewed.  Sinus rhythm, no acute ST changes.  Assessment/Plan Principal Problem:   Intractable nausea and vomiting Active Problems:   Gastroenteritis  (please populate well all problems here in Problem List. (For example, if patient is on BP meds at home and you resume or decide to hold them, it is a problem that needs to be her. Same for CAD, COPD, HLD and so on)  Intractable nauseous vomiting Possible acute gastroenteritis - GI pathogen panel ordered - Continue supportive care - IV fluid, as needed Zofran  and morphine  for GI symptoms. - As needed Imodium  Dehydration - IV fluid x 1 day, then reevaluate.  HTN - Resume home BP medication - As needed hydralazine   IDDM - SSI  Parkinson's disease - Controlled, continue Sinemet   Recently diagnosed DVT - Continue Eliquis   Anxiety/depression - Continue SSRI and SNRI  Morbid obesity - BMI= 41 - Calorie control recommended  DVT prophylaxis: Lovenox  Code Status: Full code Family Communication: None at bedside Disposition Plan: Expect less than 2 midnight hospital Consults called: None Admission status: Destiny observation   Cort ONEIDA Mana MD [facility] Pager [phone]  06/26/2024, 11:27 AM       [1]  Allergies Allergen Reactions   Cat Dander Other (See Comments) and Itching    Congestion, watery eyes    Oxycodone Other (See Comments)    Withdrawal symptoms after stopping it from last knee surgery   Penicillin G Itching   Statins Other (See Comments)   Zosyn  [Piperacillin  Sod-Tazobactam So] Swelling    Face got red and hot    "

## 2024-06-26 NOTE — ED Notes (Signed)
 Pt assisted to toilet. Unable to obtain a specimen at this time.

## 2024-06-26 NOTE — Telephone Encounter (Signed)
 Cardiology note from 06/18/24: Continue Eliquis  for DVT  Will likely recommend lovenox  bridge for upcoming surgery due to risk of recurrent DVT Follow up with neurosurgery next month  BMP today Patient will message or call with surgery date and to schedule follow up appointment  06/26/24: Patient admitted at [facility] since 06/25/24 for dehydration, nausea/vomiting/diarrhea.

## 2024-06-26 NOTE — ED Notes (Signed)
 Up to toileet to void with assist.  Able to turn and pivot using the grab bar.

## 2024-06-26 NOTE — Plan of Care (Signed)

## 2024-06-26 NOTE — Plan of Care (Signed)
" °  Problem: Education: Goal: Ability to describe self-care measures that may prevent or decrease complications (Diabetes Survival Skills Education) will improve Outcome: Progressing   Problem: Coping: Goal: Ability to adjust to condition or change in health will improve Outcome: Progressing   Problem: Fluid Volume: Goal: Ability to maintain a balanced intake and output will improve Outcome: Progressing   Problem: Nutritional: Goal: Maintenance of adequate nutrition will improve Outcome: Progressing   Problem: Skin Integrity: Goal: Risk for impaired skin integrity will decrease Outcome: Progressing   Problem: Tissue Perfusion: Goal: Adequacy of tissue perfusion will improve Outcome: Progressing   Problem: Nutrition: Goal: Adequate nutrition will be maintained Outcome: Progressing   Problem: Pain Managment: Goal: General experience of comfort will improve and/or be controlled Outcome: Progressing   Problem: Safety: Goal: Ability to remain free from injury will improve Outcome: Progressing   "

## 2024-06-26 NOTE — ED Notes (Signed)
 Pt pulled out her IV catheter

## 2024-06-27 ENCOUNTER — Other Ambulatory Visit: Payer: Self-pay

## 2024-06-27 DIAGNOSIS — R112 Nausea with vomiting, unspecified: Secondary | ICD-10-CM

## 2024-06-27 DIAGNOSIS — K529 Noninfective gastroenteritis and colitis, unspecified: Secondary | ICD-10-CM | POA: Diagnosis not present

## 2024-06-27 LAB — GASTROINTESTINAL PANEL BY PCR, STOOL (REPLACES STOOL CULTURE)

## 2024-06-27 LAB — CBC
HCT: 36.3 % (ref 36.0–46.0)
Hemoglobin: 11.3 g/dL — ABNORMAL LOW (ref 12.0–15.0)
MCH: 27.8 pg (ref 26.0–34.0)
MCHC: 31.1 g/dL (ref 30.0–36.0)
MCV: 89.4 fL (ref 80.0–100.0)
Platelets: 274 K/uL (ref 150–400)
RBC: 4.06 MIL/uL (ref 3.87–5.11)
RDW: 14.6 % (ref 11.5–15.5)
WBC: 12.2 K/uL — ABNORMAL HIGH (ref 4.0–10.5)
nRBC: 0 % (ref 0.0–0.2)

## 2024-06-27 LAB — C DIFFICILE QUICK SCREEN W PCR REFLEX
C Diff antigen: NEGATIVE
C Diff interpretation: NOT DETECTED
C Diff toxin: NEGATIVE

## 2024-06-27 LAB — BASIC METABOLIC PANEL WITH GFR
Anion gap: 11 (ref 5–15)
BUN: 9 mg/dL (ref 8–23)
CO2: 23 mmol/L (ref 22–32)
Calcium: 8.3 mg/dL — ABNORMAL LOW (ref 8.9–10.3)
Chloride: 108 mmol/L (ref 98–111)
Creatinine, Ser: 0.74 mg/dL (ref 0.44–1.00)
GFR, Estimated: >60 mL/min
Glucose, Bld: 133 mg/dL — ABNORMAL HIGH (ref 70–99)
Potassium: 3 mmol/L — ABNORMAL LOW (ref 3.5–5.1)
Sodium: 142 mmol/L (ref 135–145)

## 2024-06-27 LAB — GLUCOSE, CAPILLARY
Glucose-Capillary: 111 mg/dL — ABNORMAL HIGH (ref 70–99)
Glucose-Capillary: 163 mg/dL — ABNORMAL HIGH (ref 70–99)
Glucose-Capillary: 134 mg/dL — ABNORMAL HIGH (ref 70–99)

## 2024-06-27 LAB — MAGNESIUM: Magnesium: 1.7 mg/dL (ref 1.7–2.4)

## 2024-06-27 LAB — PHOSPHORUS: Phosphorus: 3.3 mg/dL (ref 2.5–4.6)

## 2024-06-27 MED ORDER — NITROFURANTOIN MONOHYD MACRO 100 MG PO CAPS
100.0000 mg | ORAL_CAPSULE | Freq: Two times a day (BID) | ORAL | Status: DC
  Administered 2024-06-27: 100 mg via ORAL
  Filled 2024-06-27 (×2): qty 1

## 2024-06-27 MED ORDER — NITROFURANTOIN MONOHYD MACRO 100 MG PO CAPS
100.0000 mg | ORAL_CAPSULE | Freq: Two times a day (BID) | ORAL | 0 refills | Status: AC
  Filled 2024-06-27: qty 6, 3d supply, fill #0

## 2024-06-27 MED ORDER — SODIUM CHLORIDE 0.9 % IV SOLN: INTRAVENOUS | Status: DC

## 2024-06-27 MED ORDER — ZOLPIDEM TARTRATE 5 MG PO TABS
5.0000 mg | ORAL_TABLET | Freq: Every evening | ORAL | 0 refills | Status: AC | PRN
  Filled 2024-06-27: qty 10, 10d supply, fill #0

## 2024-06-27 MED ORDER — MAGNESIUM SULFATE 2 GM/50ML IV SOLN
2.0000 g | Freq: Once | INTRAVENOUS | Status: AC
  Administered 2024-06-27: 2 g via INTRAVENOUS
  Filled 2024-06-27: qty 50

## 2024-06-27 MED ORDER — POTASSIUM CHLORIDE CRYS ER 20 MEQ PO TBCR
40.0000 meq | EXTENDED_RELEASE_TABLET | ORAL | Status: AC
  Administered 2024-06-27 (×2): 40 meq via ORAL
  Filled 2024-06-27 (×2): qty 2

## 2024-06-27 NOTE — Care Management Obs Status (Signed)
 MEDICARE OBSERVATION STATUS NOTIFICATION   Patient Details  Name: [name] MRN: [id number] Date of Birth: 11-07-1955   Medicare Observation Status Notification Given:  Yes    Santa Abdelrahman W, CMA 06/27/2024, 2:11 PM

## 2024-06-27 NOTE — Discharge Summary (Signed)
 [facility] Discharge Summary   Patient: [name] FMW:[id number]  PCP: Valora Lynwood FALCON, MD  Date of admission: 06/25/2024   Date of discharge:  06/27/2024     Discharge Diagnoses:  Principal Problem:   Intractable nausea and vomiting Active Problems:   Gastroenteritis   Admitted From: Home Disposition:  Home   Recommendations for Outpatient Follow-up:  PCP: In 1 week Follow up LABS/TEST: BMP in 1 week   Follow-up Information     Valora Lynwood FALCON, MD Follow up.   Specialty: Family Medicine Why: hospital follow up Contact information: [address] [facility] [place] [address] [phone]         Valora Lynwood FALCON, MD Follow up in 1 week(s).   Specialty: Family Medicine Contact information: [address] [facility] [place] [address] [phone]                Diet recommendation: Cardiac and Carb modified diet  Activity: The patient is advised to gradually reintroduce usual activities, as tolerated  Discharge Condition: stable  Code Status: Full code   History of present illness: As per the H and P dictated on admission.  Hospital Course:   [name] is a 68 y.o. female with medical history significant of recently diagnosed DVT on Eliquis , nonobstructive CAD, Parkinson's disease, HTN, HLD, IDDM, diabetic neuropathy, morbid obesity, presented with worsening of nauseous vomiting abdominal pain and diarrhea.   Symptoms started 2 days ago when patient started to have cramping-like epigastric abdominal pain, progressively getting worse associated with vomiting of stomach content and watery diarrhea 5-10 times a day.  Denies any blood or mucus in the diarrhea negative for tenesmus.  No fever or chills.  She has not been eating much since yesterday for her GI symptoms. ED Course: Afebrile, no tachycardia blood pressure 160/70 O2 saturation 97% on room air.  CT abdomen pelvis negative for acute findings, blood work showed  WBC 15.6 hemoglobin 12.7 BUN 8 creatinine 0.7 bicarb 24, AST 25 ALT 14.   ED tried several rounds of IV morphine  and Zofran  patient remains symptomatic and unable to tolerate p.o.   Assessment/Plan   # Acute gastroenteritis, presented with intractable nausea, vomiting and diarrhea. Negative C. difficile and GI pathogen Diarrhea resolved by itself.  Patient is able to tolerate diet.  Patient is requesting to be discharged and feels comfortable going home today.  As per patient she has potassium at home which she can take and does not want to stay another night in the hospital.  Potassium was repleted, patient seems stable to discharge and recommended to follow-up with PCP in 1 week.  Repeat BMP in 1 week.   # Hypokalemia: potassium repleted.  Patient has potassium at home, recommended to continue and repeat BMP in 1 week, follow-up with PCP.  # Dehydration due to gastroenteritis. S/p IV fluid given for hydration.  Recommended to continue oral hydration.  # HTN: Continued olmesartan home dose and torsemide. # IDDM: Resumed home meds.  Monitor CBG and continue diabetic diet. # Parkinson's disease: continue Sinemet  # Recently diagnosed DVT: - Continue Eliquis  # Anxiety/depression- Continue SSRI and SNRI    Morbid obesity class III  Body mass index is 41.2 kg/m.  Nutrition Interventions: Calorie restricted diet and daily exercise advised to lose body weight.  Lifestyle modification discussed.  Patient was ambulatory without any assistance.  On the day of the discharge the patient's vitals were stable, and no other acute medical  condition were reported by patient. the patient was felt safe to be discharge at Home.  Consultants: None Procedures: None  Discharge Exam: General: Appear in no distress, Oral Mucosa Clear, moist. Cardiovascular: S1 and S2 Present, no Murmur, Respiratory: normal respiratory effort, Bilateral Air entry present and no Crackles, no wheezes Abdomen: Bowel Sound  present, Soft and no tenderness. Extremities: no Pedal edema, no calf tenderness Neurology: alert and oriented to time, place, and person affect appropriate.  Filed Weights   06/25/24 1547  Weight: 108.9 kg   Vitals:   06/27/24 0600 06/27/24 0750  BP: 122/66 (!) 128/43  Pulse: 74 75  Resp: 16 16  Temp: 98 F (36.7 C) 98.6 F (37 C)  SpO2: 95% 94%    DISCHARGE MEDICATION: Allergies as of 06/27/2024       Reactions   Cat Dander Other (See Comments), Itching   Congestion, watery eyes    Oxycodone Other (See Comments)   Withdrawal symptoms after stopping it from last knee surgery   Penicillin G Itching   Statins Other (See Comments)   Zosyn  [piperacillin  Sod-tazobactam So] Swelling   Face got red and hot         Medication List     STOP taking these medications    amLODipine  10 MG tablet Commonly known as: NORVASC    diazepam 5 MG tablet Commonly known as: VALIUM   methocarbamol  500 MG tablet Commonly known as: ROBAXIN    pioglitazone 30 MG tablet Commonly known as: ACTOS       TAKE these medications    carbidopa -levodopa  25-100 MG tablet Commonly known as: SINEMET  IR Take 2 tablets by mouth in the morning, at noon, and at bedtime. The timing of this medication is very important.   apixaban  5 MG Tabs tablet Commonly known as: ELIQUIS  Take 1 tablet (5 mg total) by mouth 2 (two) times daily. What changed: Another medication with the same name was removed. Continue taking this medication, and follow the directions you see here.   buPROPion  150 MG 24 hr tablet Commonly known as: WELLBUTRIN  XL Take 150 mg by mouth in the morning.   citalopram  20 MG tablet Commonly known as: CELEXA  Take 20 mg by mouth at bedtime.   dapagliflozin propanediol 10 MG Tabs tablet Commonly known as: FARXIGA Take 10 mg by mouth daily.   gabapentin  300 MG capsule Commonly known as: NEURONTIN  300mg  po q morning, 600mg  po at lunch, 600mg  q hs.   insulin  degludec 100  UNIT/ML FlexTouch Pen Commonly known as: TRESIBA Inject 10 Units into the skin at bedtime.   nitrofurantoin (macrocrystal-monohydrate) 100 MG capsule Commonly known as: MACROBID Take 1 capsule (100 mg total) by mouth every 12 (twelve) hours for 3 days.   olmesartan 40 MG tablet Commonly known as: BENICAR Take 40 mg by mouth 2 (two) times daily.   ondansetron  8 MG disintegrating tablet Commonly known as: ZOFRAN -ODT Take 1 tablet (8 mg total) by mouth every 8 (eight) hours as needed for nausea or vomiting.   pantoprazole  40 MG tablet Commonly known as: PROTONIX  Take 40 mg by mouth at bedtime.   polyethylene glycol powder 17 GM/SCOOP powder Commonly known as: Smooth LAX Take 17 g by mouth daily as needed. Dissolve 1 capful (17g) in 4-8 ounces of liquid and take by mouth daily.   potassium chloride SA 20 MEQ tablet Commonly known as: KLOR-CON M Take 20 mEq by mouth 2 (two) times daily.   senna 8.6 MG Tabs tablet Commonly known as: SENOKOT  Take 1 tablet (8.6 mg total) by mouth 2 (two) times daily as needed.   spironolactone  25 MG tablet Commonly known as: ALDACTONE  Take 25 mg by mouth in the morning.   torsemide 20 MG tablet Commonly known as: DEMADEX Take 20 mg by mouth daily.   Vitamin D  (Ergocalciferol ) 1.25 MG (50000 UNIT) Caps capsule Commonly known as: DRISDOL  Take 50,000 Units by mouth once a week.   zolpidem  5 MG tablet Commonly known as: AMBIEN  Take 1 tablet (5 mg total) by mouth at bedtime as needed for sleep.       Allergies[1] Discharge Instructions     Call MD for:  difficulty breathing, headache or visual disturbances   Complete by: As directed    Call MD for:  extreme fatigue   Complete by: As directed    Call MD for:  persistant dizziness or light-headedness   Complete by: As directed    Call MD for:  persistant nausea and vomiting   Complete by: As directed    Call MD for:  severe uncontrolled pain   Complete by: As directed    Call MD for:   temperature >100.4   Complete by: As directed    Discharge instructions   Complete by: As directed    Follow-up with PCP in 1 week, repeat BMP in 1 week   Increase activity slowly   Complete by: As directed        The results of significant diagnostics from this hospitalization (including imaging, microbiology, ancillary and laboratory) are listed below for reference.    Significant Diagnostic Studies: CT ABDOMEN PELVIS W CONTRAST Result Date: 06/26/2024 EXAM: CT ABDOMEN AND PELVIS WITH CONTRAST 06/26/2024 02:21:36 AM TECHNIQUE: CT of the abdomen and pelvis was performed with the administration of 100 mL of iohexol  (OMNIPAQUE ) 350 MG/ML injection. Multiplanar reformatted images are provided for review. Automated exposure control, iterative reconstruction, and/or weight-based adjustment of the mA/kV was utilized to reduce the radiation dose to as low as reasonably achievable. COMPARISON: 06/26/2022. CLINICAL HISTORY: Abdominal pain, vomiting, diarrhea. FINDINGS: LOWER CHEST: Mild right basilar parenchymal scarring. LIVER: Moderate hepatic steatosis. No intrahepatic mass. No intrahepatic biliary ductal dilation. A cyst is present within the left hepatic lobe. Small focus of portal venous shunting within the inferior right hepatic lobe (series 39, image 2). GALLBLADDER AND BILE DUCTS: Gallbladder is unremarkable. No biliary ductal dilatation. SPLEEN: No acute abnormality. PANCREAS: No acute abnormality. ADRENAL GLANDS: No acute abnormality. KIDNEYS, URETERS AND BLADDER: A simple cortical cyst is seen within the upper pole of the right kidney, better assessed on renal sonogram of 12/12/2020. Follow-up imaging is recommended. Per consensus, no follow-up is needed for simple Bosniak type 1 and 2 renal cysts, unless the patient has a malignancy history or risk factors. The kidneys are otherwise unremarkable. No stones in the kidneys or ureters. No hydronephrosis. No perinephric or periureteral stranding.  Urinary bladder is unremarkable. GI AND BOWEL: Stomach demonstrates no acute abnormality. 2 polypoid intraluminal masses are seen within the transverse colon, measuring 1.9 x 6.0 cm (and within the descending colon, measuring roughly 9.0 x 2.6 cm, compatible with pedunculated intraluminal lipomas. . This appears unchanged from prior examination. No evidence of obstruction. Mild sigmoid diverticulosis without superimposed acute inflammatory change. The small bowel is unremarkable. Appendix normal. PERITONEUM AND RETROPERITONEUM: No ascites. No free air. VASCULATURE: Aorta is normal in caliber. Moderate aortoiliac atherosclerotic calcification. LYMPH NODES: No lymphadenopathy. REPRODUCTIVE ORGANS: No acute abnormality. BONES AND SOFT TISSUES: Osseous structures are age appropriate. No  acute bone abnormality. No lytic or blastic bone lesion. No focal soft tissue abnormality. IMPRESSION: 1. No acute findings in the abdomen or pelvis. 2. Intraluminal colonic lipomas in the transverse and descending colon, unchanged from prior examination, without obstruction. 3. Mild sigmoid diverticulosis without superimposed acute inflammatory change. 4. Moderate hepatic steatosis 5. Raf score includes aortic atherosclerosis ([id number]-i70.0). Electronically signed by: Dorethia Molt MD 06/26/2024 03:53 AM EST RP Workstation: [id number]   DG Chest 2 View Result Date: 06/25/2024 CLINICAL DATA:  Chest pain. EXAM: CHEST - 2 VIEW COMPARISON:  05/23/2024.  CT 09/06/2016 FINDINGS: Stable heart size and mediastinal contours. Prominent left epicardial fat pad. No acute airspace disease, pulmonary edema, pleural effusion or pneumothorax. Thoracic spondylosis. IMPRESSION: No acute chest findings. Electronically Signed   By: Andrea Gasman M.D.   On: 06/25/2024 17:51    Microbiology: Recent Results (from the past 240 hours)  Resp panel by RT-PCR (RSV, Flu A&B, Covid) Anterior Nasal Swab     Status: None   Collection Time: 06/26/24  2:00 AM    Specimen: Anterior Nasal Swab  Result Value Ref Range Status   SARS Coronavirus 2 by RT PCR NEGATIVE NEGATIVE Final    Comment: (NOTE) SARS-CoV-2 target nucleic acids are NOT DETECTED.  The SARS-CoV-2 RNA is generally detectable in upper respiratory specimens during the acute phase of infection. The lowest concentration of SARS-CoV-2 viral copies this assay can detect is 138 copies/mL. A negative result does not preclude SARS-Cov-2 infection and should not be used as the sole basis for treatment or other patient management decisions. A negative result may occur with  improper specimen collection/handling, submission of specimen other than nasopharyngeal swab, presence of viral mutation(s) within the areas targeted by this assay, and inadequate number of viral copies(<138 copies/mL). A negative result must be combined with clinical observations, patient history, and epidemiological information. The expected result is Negative.  Fact Sheet for Patients:  bloggercourse.com  Fact Sheet for Healthcare Providers:  seriousbroker.it  This test is no t yet approved or cleared by the [place]  FDA and  has been authorized for detection and/or diagnosis of SARS-CoV-2 by FDA under an Emergency Use Authorization (EUA). This EUA will remain  in effect (meaning this test can be used) for the duration of the COVID-19 declaration under Section 564(b)(1) of the Act, 21 U.S.C.section 360bbb-3(b)(1), unless the authorization is terminated  or revoked sooner.       Influenza A by PCR NEGATIVE NEGATIVE Final   Influenza B by PCR NEGATIVE NEGATIVE Final    Comment: (NOTE) The Xpert Xpress SARS-CoV-2/FLU/RSV plus assay is intended as an aid in the diagnosis of influenza from Nasopharyngeal swab specimens and should not be used as a sole basis for treatment. Nasal washings and aspirates are unacceptable for Xpert Xpress  SARS-CoV-2/FLU/RSV testing.  Fact Sheet for Patients: bloggercourse.com  Fact Sheet for Healthcare Providers: seriousbroker.it  This test is not yet approved or cleared by the [place]  FDA and has been authorized for detection and/or diagnosis of SARS-CoV-2 by FDA under an Emergency Use Authorization (EUA). This EUA will remain in effect (meaning this test can be used) for the duration of the COVID-19 declaration under Section 564(b)(1) of the Act, 21 U.S.C. section 360bbb-3(b)(1), unless the authorization is terminated or revoked.     Resp Syncytial Virus by PCR NEGATIVE NEGATIVE Final    Comment: (NOTE) Fact Sheet for Patients: bloggercourse.com  Fact Sheet for Healthcare Providers: seriousbroker.it  This test is not yet approved or cleared by  the [place]  FDA and has been authorized for detection and/or diagnosis of SARS-CoV-2 by FDA under an Emergency Use Authorization (EUA). This EUA will remain in effect (meaning this test can be used) for the duration of the COVID-19 declaration under Section 564(b)(1) of the Act, 21 U.S.C. section 360bbb-3(b)(1), unless the authorization is terminated or revoked.  Performed at [facility], [address] [place], [place] [address]   Blood culture (routine x 2)     Status: None (Preliminary result)   Collection Time: 06/26/24  2:00 AM   Specimen: BLOOD  Result Value Ref Range Status   Specimen Description BLOOD BLOOD LEFT ARM  Final   Special Requests   Final    BOTTLES DRAWN AEROBIC AND ANAEROBIC Blood Culture adequate volume   Culture   Final    NO GROWTH 1 DAY Performed at [facility], [address] [place], [place] [address]    Report Status PENDING  Incomplete  Blood culture (routine x 2)     Status: None (Preliminary result)   Collection Time: 06/26/24  2:00 AM   Specimen: BLOOD  Result  Value Ref Range Status   Specimen Description BLOOD BLOOD RIGHT ARM  Final   Special Requests   Final    BOTTLES DRAWN AEROBIC AND ANAEROBIC Blood Culture adequate volume   Culture   Final    NO GROWTH 1 DAY Performed at [facility], [address], [place], [place] [address]    Report Status PENDING  Incomplete  Gastrointestinal Panel by PCR , Stool     Status: None   Collection Time: 06/27/24  9:36 AM   Specimen: Stool  Result Value Ref Range Status   Campylobacter species NOT DETECTED NOT DETECTED Final   Plesimonas shigelloides NOT DETECTED NOT DETECTED Final   Salmonella species NOT DETECTED NOT DETECTED Final   Yersinia enterocolitica NOT DETECTED NOT DETECTED Final   Vibrio species NOT DETECTED NOT DETECTED Final   Vibrio cholerae NOT DETECTED NOT DETECTED Final   Enteroaggregative E coli (EAEC) NOT DETECTED NOT DETECTED Final   Enteropathogenic E coli (EPEC) NOT DETECTED NOT DETECTED Final   Enterotoxigenic E coli (ETEC) NOT DETECTED NOT DETECTED Final   Shiga like toxin producing E coli (STEC) NOT DETECTED NOT DETECTED Final   Shigella/Enteroinvasive E coli (EIEC) NOT DETECTED NOT DETECTED Final   Cryptosporidium NOT DETECTED NOT DETECTED Final   Cyclospora cayetanensis NOT DETECTED NOT DETECTED Final   Entamoeba histolytica NOT DETECTED NOT DETECTED Final   Giardia lamblia NOT DETECTED NOT DETECTED Final   Adenovirus F40/41 NOT DETECTED NOT DETECTED Final   Astrovirus NOT DETECTED NOT DETECTED Final   Norovirus GI/GII NOT DETECTED NOT DETECTED Final   Rotavirus A NOT DETECTED NOT DETECTED Final   Sapovirus (I, II, IV, and V) NOT DETECTED NOT DETECTED Final    Comment: Performed at [facility], [address] [place], [place] [address]  C Difficile Quick Screen w PCR reflex     Status: None   Collection Time: 06/27/24  9:36 AM   Specimen: STOOL  Result Value Ref Range Status   C Diff antigen NEGATIVE NEGATIVE Final   C Diff toxin NEGATIVE  NEGATIVE Final   C Diff interpretation No C. difficile detected.  Final    Comment: Performed at [facility], [address] [place], [place] [address]     Labs: CBC: Recent Labs  Lab 06/25/24 1551 06/27/24 0435  WBC 15.6* 12.2*  HGB 12.7  11.3*  HCT 41.1 36.3  MCV 89.0 89.4  PLT 307 274   Basic Metabolic Panel: Recent Labs  Lab 06/25/24 1551 06/27/24 0948  NA 140 142  K 3.8 3.0*  CL 101 108  CO2 24 23  GLUCOSE 113* 133*  BUN 8 9  CREATININE 0.79 0.74  CALCIUM 9.4 8.3*  MG  --  1.7  PHOS  --  3.3   Liver Function Tests: Recent Labs  Lab 06/25/24 1802  AST 25  ALT 14  ALKPHOS 95  BILITOT 0.4  PROT 7.2  ALBUMIN 4.3   Recent Labs  Lab 06/25/24 1802  LIPASE 22   No results for input(s): AMMONIA in the last 168 hours. Cardiac Enzymes: No results for input(s): CKTOTAL, CKMB, CKMBINDEX, TROPONINI in the last 168 hours. BNP (last 3 results) No results for input(s): BNP in the last 8760 hours. CBG: Recent Labs  Lab 06/26/24 1148 06/26/24 1735 06/26/24 2115 06/27/24 0849 06/27/24 1209  GLUCAP 103* 144* 105* 111* 163*    Time spent: 35 minutes  Signed:  Elvan Sor  [facility] 06/27/2024 3:12 PM      [1]  Allergies Allergen Reactions   Cat Dander Other (See Comments) and Itching    Congestion, watery eyes    Oxycodone Other (See Comments)    Withdrawal symptoms after stopping it from last knee surgery   Penicillin G Itching   Statins Other (See Comments)   Zosyn  [Piperacillin  Sod-Tazobactam So] Swelling    Face got red and hot

## 2024-06-29 NOTE — Telephone Encounter (Signed)
 Pt was discharged on 06/27/24 with plans to repeat BMP and follow up with PCP in 1 week. Will wait for her to see PCP for follow up and contact us  to schedule surgery when she is recovered.  *Will need lovenox  bridge for surgery per cardiology note 06/18/24.*

## 2024-07-01 LAB — CULTURE, BLOOD (ROUTINE X 2)
Culture: NO GROWTH
Culture: NO GROWTH
Special Requests: ADEQUATE
Special Requests: ADEQUATE

## 2024-07-03 ENCOUNTER — Telehealth: Payer: Self-pay | Admitting: Neurosurgery

## 2024-07-03 NOTE — Telephone Encounter (Signed)
 Called and left a message for the patient and then called [facility] who has been seeing patient for PT and verified it was them that called to ask for verbal orders to continue [facility] PT once a week for 2 weeks and then will most likely re certify for more visits but they would let us  know. Verbal ok was given. FYI

## 2024-07-03 NOTE — Telephone Encounter (Signed)
 Okay to continue with HHPT as below.

## 2024-07-03 NOTE — Telephone Encounter (Signed)
 Please see below message from the answering service.     Media Information  Document Information  Misc Clinical: AMB Correspondence  NEUROSURGERY ANSWERING SERVICE CALL  07/02/2024 14:02  Attached To:  [name]  Source Information  Default, Provider, MD

## 2024-07-05 ENCOUNTER — Other Ambulatory Visit: Payer: Self-pay

## 2024-07-05 ENCOUNTER — Emergency Department
Admission: EM | Admit: 2024-07-05 | Discharge: 2024-07-06 | Disposition: A | Discharge status: Home / Self Care | Attending: Emergency Medicine | Admitting: Emergency Medicine

## 2024-07-05 DIAGNOSIS — R109 Unspecified abdominal pain: Secondary | ICD-10-CM | POA: Insufficient documentation

## 2024-07-05 DIAGNOSIS — R197 Diarrhea, unspecified: Secondary | ICD-10-CM | POA: Insufficient documentation

## 2024-07-05 DIAGNOSIS — R112 Nausea with vomiting, unspecified: Secondary | ICD-10-CM | POA: Diagnosis present

## 2024-07-05 LAB — COMPREHENSIVE METABOLIC PANEL WITH GFR
ALT: 24 U/L (ref 0–44)
AST: 31 U/L (ref 15–41)
Albumin: 4 g/dL (ref 3.5–5.0)
Alkaline Phosphatase: 102 U/L (ref 38–126)
Anion gap: 14 (ref 5–15)
BUN: 9 mg/dL (ref 8–23)
CO2: 25 mmol/L (ref 22–32)
Calcium: 9.5 mg/dL (ref 8.9–10.3)
Chloride: 99 mmol/L (ref 98–111)
Creatinine, Ser: 0.82 mg/dL (ref 0.44–1.00)
GFR, Estimated: >60 mL/min
Glucose, Bld: 111 mg/dL — ABNORMAL HIGH (ref 70–99)
Potassium: 3.7 mmol/L (ref 3.5–5.1)
Sodium: 138 mmol/L (ref 135–145)
Total Bilirubin: 0.4 mg/dL (ref 0.0–1.2)
Total Protein: 7.1 g/dL (ref 6.5–8.1)

## 2024-07-05 LAB — CBC
HCT: 40.8 % (ref 36.0–46.0)
Hemoglobin: 12.6 g/dL (ref 12.0–15.0)
MCH: 27.2 pg (ref 26.0–34.0)
MCHC: 30.9 g/dL (ref 30.0–36.0)
MCV: 88.1 fL (ref 80.0–100.0)
Platelets: 307 K/uL (ref 150–400)
RBC: 4.63 MIL/uL (ref 3.87–5.11)
RDW: 14.4 % (ref 11.5–15.5)
WBC: 12.9 K/uL — ABNORMAL HIGH (ref 4.0–10.5)
nRBC: 0 % (ref 0.0–0.2)

## 2024-07-05 LAB — URINALYSIS, ROUTINE W REFLEX MICROSCOPIC
Bilirubin Urine: NEGATIVE
Glucose, UA: NEGATIVE mg/dL
Hgb urine dipstick: NEGATIVE
Ketones, ur: 5 mg/dL — AB
Leukocytes,Ua: NEGATIVE
Nitrite: NEGATIVE
Protein, ur: 30 mg/dL — AB
Specific Gravity, Urine: 1.027 (ref 1.005–1.030)
pH: 5 (ref 5.0–8.0)

## 2024-07-05 LAB — LIPASE, BLOOD: Lipase: 58 U/L — ABNORMAL HIGH (ref 11–51)

## 2024-07-05 MED ORDER — LORAZEPAM 2 MG/ML IJ SOLN
0.5000 mg | Freq: Once | INTRAMUSCULAR | Status: AC
  Administered 2024-07-05: 0.5 mg via INTRAVENOUS
  Filled 2024-07-05: qty 1

## 2024-07-05 MED ORDER — METOCLOPRAMIDE HCL 5 MG/ML IJ SOLN
10.0000 mg | Freq: Once | INTRAMUSCULAR | Status: AC
  Administered 2024-07-05: 10 mg via INTRAVENOUS
  Filled 2024-07-05: qty 2

## 2024-07-05 MED ORDER — LORAZEPAM 0.5 MG PO TABS: 0.5000 mg | ORAL_TABLET | Freq: Three times a day (TID) | ORAL | 0 refills | Status: AC | PRN

## 2024-07-05 MED ORDER — SODIUM CHLORIDE 0.9 % IV BOLUS
1000.0000 mL | Freq: Once | INTRAVENOUS | Status: AC
  Administered 2024-07-05: 1000 mL via INTRAVENOUS

## 2024-07-05 MED ORDER — METOCLOPRAMIDE HCL 10 MG PO TABS: 10.0000 mg | ORAL_TABLET | Freq: Three times a day (TID) | ORAL | 1 refills | Status: AC | PRN

## 2024-07-05 NOTE — ED Notes (Signed)
 Fall risk bundle is currently in place.

## 2024-07-05 NOTE — ED Notes (Signed)
 Provided water  and crackers for PO Challenge

## 2024-07-05 NOTE — ED Provider Notes (Signed)
 "  [facility] Provider Note    Event Date/Time   First MD Initiated Contact with Patient 07/05/24 2008     (approximate)   History   Abdominal Pain   HPI  [name] is a 69 y.o. female who presents to the emergency department today because of concerns for continued nausea vomiting diarrhea and abdominal pain.  Patient was admitted to the hospital roughly 1 week ago for the same symptoms.  Had reassuring CT done at that time and was treated with IV fluids and medication.  She states since getting home she has been feeling worse again.  She has tried the medications prescribed for her without any significant relief.  Did see her doctor today who prescribed Phenergan  which she tried once today without any significant relief.  Patient is concerned because she feels like she has lost weight.  She has noticed intermittent blood in her diarrhea which she says she was told was due to hemorrhoids.   Physical Exam   Triage Vital Signs: ED Triage Vitals  Encounter Vitals Group     BP 07/05/24 1632 139/87     Girls Systolic BP Percentile --      Girls Diastolic BP Percentile --      Boys Systolic BP Percentile --      Boys Diastolic BP Percentile --      Pulse Rate 07/05/24 1632 91     Resp 07/05/24 1632 18     Temp 07/05/24 1632 98.5 F (36.9 C)     Temp Source 07/05/24 1948 Oral     SpO2 07/05/24 1632 96 %     Weight 07/05/24 1630 240 lb (108.9 kg)     Height 07/05/24 1630 5' 4 (1.626 m)     Head Circumference --      Peak Flow --      Pain Score 07/05/24 1630 9     Pain Loc --      Pain Education --      Exclude from Growth Chart --     Most recent vital signs: Vitals:   07/05/24 1632 07/05/24 1948  BP: 139/87 (!) 143/63  Pulse: 91 78  Resp: 18 17  Temp: 98.5 F (36.9 C) 98.6 F (37 C)  SpO2: 96% 98%   General: Awake, alert, oriented. CV:  Good peripheral perfusion. Regular rate and rhythm. Resp:  Normal effort. Lungs clear. Abd:  No  distention. Non tender.  ED Results / Procedures / Treatments   Labs (all labs ordered are listed, but only abnormal results are displayed) Labs Reviewed  LIPASE, BLOOD - Abnormal; Notable for the following components:      Result Value   Lipase 58 (*)    All other components within normal limits  COMPREHENSIVE METABOLIC PANEL WITH GFR - Abnormal; Notable for the following components:   Glucose, Bld 111 (*)    All other components within normal limits  CBC - Abnormal; Notable for the following components:   WBC 12.9 (*)    All other components within normal limits  URINALYSIS, ROUTINE W REFLEX MICROSCOPIC - Abnormal; Notable for the following components:   Color, Urine AMBER (*)    APPearance HAZY (*)    Ketones, ur 5 (*)    Protein, ur 30 (*)    Bacteria, UA RARE (*)    All other components within normal limits     EKG  None   RADIOLOGY None  PROCEDURES:  Critical Care performed: No  MEDICATIONS ORDERED IN ED: Medications  sodium chloride  0.9 % bolus 1,000 mL (has no administration in time range)  metoCLOPramide  (REGLAN ) injection 10 mg (has no administration in time range)     IMPRESSION / MDM / ASSESSMENT AND PLAN / ED COURSE  I reviewed the triage vital signs and the nursing notes.                              Differential diagnosis includes, but is not limited to, pancreatitis, hepatitis, gastroenteritis  Patient's presentation is most consistent with acute presentation with potential threat to life or bodily function.   Patient presented to the emergency department today because of concerns for continued nausea and vomiting accompanied by abdominal pain.  Patient was admitted to the hospital about a week ago for the same symptoms with reassuring CT scan.  On exam today patient is afebrile.  No abdominal tenderness.  Blood work shows very mild leukocytosis without significant electrolyte abnormality.  Patient was given IV fluids and medication.  She  did seem to respond best to a little bit of Ativan .  She was able to tolerate p.o. afterwards.  I do wonder if possibly anxiety is playing a role in the patient's symptoms.  Given that she was able to tolerate p.o. and reassuring blood work I do think it is reasonable for patient be discharged.  She states she does have prescriptions for nausea medicine already at home.  Will give patient small course of Ativan  to see if that helps her symptoms at home.     FINAL CLINICAL IMPRESSION(S) / ED DIAGNOSES   Final diagnoses:  Nausea and vomiting, unspecified vomiting type     Note:  This document was prepared using Dragon voice recognition software and may include unintentional dictation errors.    Floy Roberts, MD 07/05/24 2342  "

## 2024-07-05 NOTE — ED Triage Notes (Signed)
 Pt comes via EMS with belly pain, vomiting and diarrhea for 10 days, pt does have GI issues from past.

## 2024-07-05 NOTE — Progress Notes (Signed)
 Chief Complaint  Patient presents with   Hospital Follow Up    06/25/24 - 06/27/24 - Left sided chest wall pain, epigastric abdominal pain - repeat BMP     Raul Winterhalter is a 69 y.o. female in clinic today for an acute visit.  HPI: History of Present Illness Lotoya Casella is a 69 year old female with diabetes who presents with persistent nausea and inability to eat.  She has been experiencing severe nausea for over ten days, significantly impacting her ability to eat and drink. She has not eaten since Sunday and is only able to take a few sips of liquid each day. Despite taking Zofran , her nausea persists, and she has not vomited but feels constantly nauseous. She has lost ten pounds since December 22nd.  She is concerned about her blood sugar levels dropping due to her inability to eat. She uses a Dexcom to monitor her blood sugar, which has been low at times, requiring her to eat small amounts of food like toast to stabilize it. She has been trying to manage her blood sugar by consuming small amounts of food and fluids.  She was previously hospitalized on December 29 and discharged December 31. She received fluids, which temporarily alleviated her nausea. However, the symptoms returned shortly after discharge. During her hospital stay, her potassium levels were noted to be low, and her white blood cell count was slightly elevated. She was given Macrobid , which she took for two days but discontinued due to feeling unwell.  No fever but some abdominal pain, particularly in the center of her abdomen. No recent diarrhea, which she attributes to taking Pepto-Bismol. No recent respiratory symptoms such as cough or congestion. She takes Protonix  for acid reflux and was taking hydrocodone  for leg pain.   ROS: Review of Systems  Constitutional:  Negative for chills and fever.  Gastrointestinal:  Positive for abdominal pain and nausea. Negative for constipation, diarrhea and vomiting.     Allergies   Allergen Reactions   Cat Hair Standardized Allergenic Extract Itching        Oxycontin [Oxycodone] Unknown        Piperacillin -Tazobactam-Dextrs Swelling    Face got red and hot   Statins-Hmg-Coa Reductase Inhibitors Muscle Pain   Zosyn  [Piperacillin -Tazobactam] Rash    Pt states that she had sneezing, eyes watering    Past Medical History:  Diagnosis Date   Anemia    Anxiety and depression    Asthma without status asthmaticus (HHS-HCC)    Benign essential tremor    Claustrophobia    ok with oxygen mask.   Diabetes mellitus without complication (CMS/HHS-HCC)    Fibromyalgia    GERD (gastroesophageal reflux disease)    Heart palpitations    Hemorrhoids    History of chickenpox    History of gastric ulcer    History of motion sickness    History of pleurisy    Hyperlipidemia    Hypertension    Hypothyroid    Leukocytosis    Migraines    Osteoarthritis    PONV (postoperative nausea and vomiting)    Seasonal allergies    year round allergy shots q mondays x 2 years   Snores    neg sleep study     Results for orders placed or performed in visit on 06/18/24  Basic Metabolic Panel (BMP)  Result Value Ref Range   Glucose 111 (H) 70 - 110 mg/dL   Sodium 860 863 - 854 mmol/L   Potassium 4.3 3.6 -  5.1 mmol/L   Chloride 104 97 - 109 mmol/L   Carbon Dioxide (CO2) 28.2 22.0 - 32.0 mmol/L   Calcium 9.1 8.7 - 10.3 mg/dL   Urea Nitrogen (BUN) 11 7 - 25 mg/dL   Creatinine 0.8 0.6 - 1.1 mg/dL   Glomerular Filtration Rate (eGFR) 79 >60 mL/min/1.73sq m   BUN/Crea Ratio 13.8 6.0 - 20.0   Anion Gap w/K 11.1 6.0 - 16.0   *Note: Due to a large number of results and/or encounters for the requested time period, some results have not been displayed. A complete set of results can be found in Results Review.    BP 126/72 (BP Location: Left upper arm, Patient Position: Sitting, BP Cuff Size: Large Adult)   Pulse 106   Ht 162.6 cm (5' 4.02)   Wt (!)  104.3 kg (230 lb)   SpO2 96%   BMI 39.46 kg/m    Physical Exam Constitutional:      Appearance: Normal appearance.  Cardiovascular:     Rate and Rhythm: Normal rate and regular rhythm.     Heart sounds: Normal heart sounds.  Pulmonary:     Effort: Pulmonary effort is normal.     Breath sounds: Normal breath sounds.  Abdominal:     General: Abdomen is flat. Bowel sounds are normal.     Tenderness: There is abdominal tenderness in the epigastric area and periumbilical area.  Neurological:     Mental Status: She is alert.     Plan: Assessment & Plan Persistent nausea and vomiting Nausea for over ten days without vomiting, leading to inability to eat for several days. Previous hospitalization provided temporary relief with fluids. Zofran  ineffective. CT scan in hospital did not indicate any cause for her nausea. Abdominal pain present, but no visible signs of IBS or diverticulitis. Stress and medication use may contribute to symptoms. White count today is 12.0 decreased from 12.2. I don't think there is any need for antibiotics.  - Prescribed Phenergan  12.5 mg every six hours, with option to take 25 mg if needed, not exceeding 50 mg per day. - Advised to sip fluids to maintain hydration and blood sugar levels. - Instructed to return to ER if unable to keep fluids down or if symptoms worsen.  Hypokalemia Likely secondary to inadequate oral intake and possible vomiting. Previous hospitalization noted low potassium levels.  - Encouraged oral intake of electrolyte solutions to correct potassium levels. - Will recheck BMP to assess potassium levels.  Type 2 diabetes mellitus Concern for hypoglycemia due to inadequate oral intake. Blood sugar levels have dropped, requiring intervention to prevent further decline. - Encouraged trying to eat small meals to keep blood sugar from dropping.  - Instructed to monitor blood sugar levels regularly using Dexcom.       Powell Snell, PA-C

## 2024-07-09 ENCOUNTER — Telehealth: Payer: Self-pay | Admitting: Neurosurgery

## 2024-07-09 DIAGNOSIS — M5416 Radiculopathy, lumbar region: Secondary | ICD-10-CM

## 2024-07-09 DIAGNOSIS — M5126 Other intervertebral disc displacement, lumbar region: Secondary | ICD-10-CM

## 2024-07-09 NOTE — Telephone Encounter (Signed)
 Pt called in to speak about sx scheduling.

## 2024-07-09 NOTE — Telephone Encounter (Signed)
 Do you need to see her again to discuss surgery prior to scheduling?   **Cardiology note from 06/18/24: Continue Eliquis  for DVT  Will likely recommend lovenox  bridge for upcoming surgery due to risk of recurrent DVT  ____________  **She was in the ER on 12/29 for vomiting. Then saw her PCP on 1/8 and went back to the ER on 1/8 for vomiting.

## 2024-07-09 NOTE — Telephone Encounter (Signed)
 I spoke with [name]. She states she is very nervous about having surgery and would like to see Dr Clois prior to scheduling surgery to discuss her drop foot and ask some questions. She needs an afternoon appointment. I scheduled her with Dr Clois tomorrow afternoon.

## 2024-07-10 ENCOUNTER — Telehealth: Payer: Self-pay

## 2024-07-10 ENCOUNTER — Emergency Department

## 2024-07-10 ENCOUNTER — Observation Stay
Admission: EM | Admit: 2024-07-10 | Discharge: 2024-07-13 | Disposition: A | Discharge status: Home Health | Attending: Student | Admitting: Student

## 2024-07-10 ENCOUNTER — Ambulatory Visit: Admitting: Neurosurgery

## 2024-07-10 DIAGNOSIS — E876 Hypokalemia: Secondary | ICD-10-CM | POA: Diagnosis not present

## 2024-07-10 DIAGNOSIS — G20C Parkinsonism, unspecified: Secondary | ICD-10-CM | POA: Diagnosis not present

## 2024-07-10 DIAGNOSIS — R202 Paresthesia of skin: Secondary | ICD-10-CM | POA: Diagnosis present

## 2024-07-10 DIAGNOSIS — I5031 Acute diastolic (congestive) heart failure: Secondary | ICD-10-CM | POA: Insufficient documentation

## 2024-07-10 DIAGNOSIS — M79605 Pain in left leg: Secondary | ICD-10-CM

## 2024-07-10 DIAGNOSIS — I110 Hypertensive heart disease with heart failure: Secondary | ICD-10-CM | POA: Insufficient documentation

## 2024-07-10 DIAGNOSIS — I2510 Atherosclerotic heart disease of native coronary artery without angina pectoris: Secondary | ICD-10-CM | POA: Insufficient documentation

## 2024-07-10 DIAGNOSIS — I10 Essential (primary) hypertension: Secondary | ICD-10-CM | POA: Insufficient documentation

## 2024-07-10 DIAGNOSIS — R200 Anesthesia of skin: Secondary | ICD-10-CM | POA: Diagnosis not present

## 2024-07-10 DIAGNOSIS — F419 Anxiety disorder, unspecified: Secondary | ICD-10-CM | POA: Diagnosis not present

## 2024-07-10 DIAGNOSIS — Z87891 Personal history of nicotine dependence: Secondary | ICD-10-CM | POA: Diagnosis not present

## 2024-07-10 DIAGNOSIS — K219 Gastro-esophageal reflux disease without esophagitis: Secondary | ICD-10-CM | POA: Insufficient documentation

## 2024-07-10 DIAGNOSIS — R29898 Other symptoms and signs involving the musculoskeletal system: Secondary | ICD-10-CM

## 2024-07-10 DIAGNOSIS — F32A Depression, unspecified: Secondary | ICD-10-CM | POA: Diagnosis not present

## 2024-07-10 DIAGNOSIS — Z96653 Presence of artificial knee joint, bilateral: Secondary | ICD-10-CM | POA: Insufficient documentation

## 2024-07-10 DIAGNOSIS — E1142 Type 2 diabetes mellitus with diabetic polyneuropathy: Secondary | ICD-10-CM | POA: Insufficient documentation

## 2024-07-10 DIAGNOSIS — M21371 Foot drop, right foot: Secondary | ICD-10-CM | POA: Insufficient documentation

## 2024-07-10 DIAGNOSIS — Z6841 Body Mass Index (BMI) 40.0 and over, adult: Secondary | ICD-10-CM | POA: Diagnosis not present

## 2024-07-10 DIAGNOSIS — Z7901 Long term (current) use of anticoagulants: Secondary | ICD-10-CM | POA: Insufficient documentation

## 2024-07-10 DIAGNOSIS — I5032 Chronic diastolic (congestive) heart failure: Secondary | ICD-10-CM | POA: Diagnosis not present

## 2024-07-10 DIAGNOSIS — E119 Type 2 diabetes mellitus without complications: Secondary | ICD-10-CM | POA: Diagnosis not present

## 2024-07-10 DIAGNOSIS — M5126 Other intervertebral disc displacement, lumbar region: Secondary | ICD-10-CM

## 2024-07-10 DIAGNOSIS — Z794 Long term (current) use of insulin: Secondary | ICD-10-CM | POA: Insufficient documentation

## 2024-07-10 DIAGNOSIS — M1712 Unilateral primary osteoarthritis, left knee: Secondary | ICD-10-CM | POA: Insufficient documentation

## 2024-07-10 DIAGNOSIS — E6609 Other obesity due to excess calories: Secondary | ICD-10-CM | POA: Insufficient documentation

## 2024-07-10 DIAGNOSIS — R209 Unspecified disturbances of skin sensation: Secondary | ICD-10-CM

## 2024-07-10 DIAGNOSIS — Z79899 Other long term (current) drug therapy: Secondary | ICD-10-CM | POA: Insufficient documentation

## 2024-07-10 DIAGNOSIS — J45909 Unspecified asthma, uncomplicated: Secondary | ICD-10-CM | POA: Diagnosis not present

## 2024-07-10 DIAGNOSIS — G20A1 Parkinson's disease without dyskinesia, without mention of fluctuations: Secondary | ICD-10-CM | POA: Insufficient documentation

## 2024-07-10 LAB — CBC WITH DIFFERENTIAL/PLATELET
Abs Immature Granulocytes: 0.05 K/uL (ref 0.00–0.07)
Basophils Absolute: 0 K/uL (ref 0.0–0.1)
Basophils Relative: 0 %
Eosinophils Absolute: 0.2 K/uL (ref 0.0–0.5)
Eosinophils Relative: 1 %
HCT: 40.1 % (ref 36.0–46.0)
Hemoglobin: 12.4 g/dL (ref 12.0–15.0)
Immature Granulocytes: 0 %
Lymphocytes Relative: 27 %
Lymphs Abs: 3.4 K/uL (ref 0.7–4.0)
MCH: 27 pg (ref 26.0–34.0)
MCHC: 30.9 g/dL (ref 30.0–36.0)
MCV: 87.4 fL (ref 80.0–100.0)
Monocytes Absolute: 1 K/uL (ref 0.1–1.0)
Monocytes Relative: 8 %
Neutro Abs: 8 K/uL — ABNORMAL HIGH (ref 1.7–7.7)
Neutrophils Relative %: 64 %
Platelets: 267 K/uL (ref 150–400)
RBC: 4.59 MIL/uL (ref 3.87–5.11)
RDW: 14 % (ref 11.5–15.5)
WBC: 12.7 K/uL — ABNORMAL HIGH (ref 4.0–10.5)
nRBC: 0 % (ref 0.0–0.2)

## 2024-07-10 LAB — BASIC METABOLIC PANEL WITH GFR
Anion gap: 11 (ref 5–15)
BUN: 9 mg/dL (ref 8–23)
CO2: 27 mmol/L (ref 22–32)
Calcium: 8.7 mg/dL — ABNORMAL LOW (ref 8.9–10.3)
Chloride: 102 mmol/L (ref 98–111)
Creatinine, Ser: 0.61 mg/dL (ref 0.44–1.00)
GFR, Estimated: >60 mL/min
Glucose, Bld: 155 mg/dL — ABNORMAL HIGH (ref 70–99)
Potassium: 2.9 mmol/L — ABNORMAL LOW (ref 3.5–5.1)
Sodium: 140 mmol/L (ref 135–145)

## 2024-07-10 LAB — GLUCOSE, CAPILLARY: Glucose-Capillary: 129 mg/dL — ABNORMAL HIGH (ref 70–99)

## 2024-07-10 MED ORDER — HYDROCODONE-ACETAMINOPHEN 5-325 MG PO TABS
1.0000 | ORAL_TABLET | Freq: Once | ORAL | Status: AC
  Administered 2024-07-10: 1 via ORAL
  Filled 2024-07-10: qty 1

## 2024-07-10 MED ORDER — POTASSIUM CHLORIDE CRYS ER 20 MEQ PO TBCR
40.0000 meq | EXTENDED_RELEASE_TABLET | Freq: Once | ORAL | Status: AC
  Administered 2024-07-10: 40 meq via ORAL
  Filled 2024-07-10: qty 2

## 2024-07-10 NOTE — Progress Notes (Signed)
 I spoke with [name].  She has had severe difficulty with walking and with pain.  She is very frustrated.  I have previously offered her reexploration, but she was not ready to commit to this.     She requested that I directly admit her to the hospital.  I explained to her that this was not possible under current protocols at the hospital.  However, I did recommend that she present to the emergency department for admission.  After discussing with the ER charge nurse and with the patient again, she agreed to this.  We will do our best to accommodate her in the emergency department.  I think she will require admission to the hospital with thoracic and lumbar spine MRI scans.  I instructed her to stop taking her Eliquis .  She did take it this morning.  The earliest we could consider surgery would be Friday.  I will defer to the other providers involved regarding any additional workup other than the MRI scans mentioned above.

## 2024-07-10 NOTE — ED Provider Notes (Signed)
 [facility] Provider Note    Event Date/Time   First MD Initiated Contact with Patient 07/10/24 1940     (approximate)   History   Numbness   HPI  [name] is a 69 y.o. female with history of disc herniation, fibromyalgia, chronic back pain, hyperlipidemia, hypertension, presenting with numbness tingling to her left lower extremity.  States been ongoing for several days.  States that she has a chronic dropfoot on the right since her surgery in October.  No incontinence, no saddle anesthesia.  States that the pain is mostly left leg, denies back pain at this time.  Denies recent trauma or falls, no fever.  On independent chart review, she was seen by Dr. Dann from neurosurgery who recommended admission, MRI of her lumbar and thoracic spine.     Physical Exam   Triage Vital Signs: ED Triage Vitals  Encounter Vitals Group     BP 07/10/24 1544 (!) 162/88     Girls Systolic BP Percentile --      Girls Diastolic BP Percentile --      Boys Systolic BP Percentile --      Boys Diastolic BP Percentile --      Pulse Rate 07/10/24 1544 95     Resp 07/10/24 1544 18     Temp 07/10/24 1544 98.7 F (37.1 C)     Temp Source 07/10/24 1544 Oral     SpO2 07/10/24 1544 94 %     Weight 07/10/24 1545 239 lb 13.8 oz (108.8 kg)     Height 07/10/24 1545 5' 4 (1.626 m)     Head Circumference --      Peak Flow --      Pain Score 07/10/24 1544 8     Pain Loc --      Pain Education --      Exclude from Growth Chart --     Most recent vital signs: Vitals:   07/10/24 1544 07/10/24 2146  BP: (!) 162/88 (!) 141/63  Pulse: 95 96  Resp: 18 17  Temp: 98.7 F (37.1 C) 98.5 F (36.9 C)  SpO2: 94% 95%     General: Awake, no distress.  CV:  Good peripheral perfusion.  Resp:  Normal effort.  Abd:  No distention.  Other:  No midline spinal tenderness, she has dropfoot on her right that is chronic, able to dorsi and plantarflex her left foot, no saddle anesthesia,  she does have some mild decrease sensation to her left lower extremity.   ED Results / Procedures / Treatments   Labs (all labs ordered are listed, but only abnormal results are displayed) Labs Reviewed  BASIC METABOLIC PANEL WITH GFR - Abnormal; Notable for the following components:      Result Value   Potassium 2.9 (*)    Glucose, Bld 155 (*)    Calcium 8.7 (*)    All other components within normal limits  CBC WITH DIFFERENTIAL/PLATELET - Abnormal; Notable for the following components:   WBC 12.7 (*)    Neutro Abs 8.0 (*)    All other components within normal limits     PROCEDURES:  Critical Care performed: No  Procedures   MEDICATIONS ORDERED IN ED: Medications  potassium chloride  SA (KLOR-CON  M) CR tablet 40 mEq (has no administration in time range)  HYDROcodone -acetaminophen  (NORCO/VICODIN) 5-325 MG per tablet 1 tablet (1 tablet Oral Given 07/10/24 2002)     IMPRESSION / MDM / ASSESSMENT AND PLAN /  ED COURSE  I reviewed the triage vital signs and the nursing notes.                              Differential diagnosis includes, but is not limited to, recurrent disc herniation, radiculopathy, electrolyte derangements, no saddle anesthesia or incontinence suggest cauda equina at this time.  Will get labs, MRI, plan to admit.  Patient's presentation is most consistent with acute presentation with potential threat to life or bodily function.  Independent interpretation of labs below.  Consulted hospitalist for admission.  She is admitted.    Clinical Course as of 07/10/24 2237  Tue Jul 10, 2024  2236 Independent review of labs, mild leukocytosis, potassium is low, will replete, rest electrolytes severely deranged, creatinine is normal. [TT]    Clinical Course User Index [TT] Waymond, Lorelle Cummins, MD     FINAL CLINICAL IMPRESSION(S) / ED DIAGNOSES   Final diagnoses:  Numbness  Pain of left lower extremity  Hypokalemia     Rx / DC Orders   ED Discharge Orders      None        Note:  This document was prepared using Dragon voice recognition software and may include unintentional dictation errors.    Waymond Lorelle Cummins, MD 07/10/24 [phone]

## 2024-07-10 NOTE — Telephone Encounter (Signed)
 Per message from [place]: Patient's spouse is on the phone and states that she is unable to get out of her chair and has no use of her left leg. He does not think she can get to her appointment today.  Per discussion with Dr Clois, today's appointment was switched to a telephone visit.

## 2024-07-10 NOTE — Telephone Encounter (Signed)
 Med list printed

## 2024-07-10 NOTE — ED Triage Notes (Signed)
 Pt presents to the ED via [facility] from home with back pain. Pt had back surgery 2 months ago. Pt had drop foot in her right foot +numbness that started a couple of weeks ago. Was supposed to have an appointment today, but was not able to go. Pt started having numbness in left foot yesterday. Numbness in bilateral lower extremities at this time. Pt took gabapentin  about 1.5 hours ago. Dr Katrina has a note in the chart recommending an MRI , possible admission and possible exploratory surgery on Friday. Pt reports that she is unable to walk.

## 2024-07-10 NOTE — Telephone Encounter (Signed)
 [name] called in stating that she wants to be admitted to the hospital under Dr Clois and doesn't want to sit in the ER because she is in too bad of a shape and cannot walk. She is requesting xrays and a new MRI to find out what is wrong with her. She states the left leg wont move, the right leg has a drop foot (not new). The left leg started 2 days ago and she cannot stand on the left leg because she will fall, it is swelling, and she cannot walk. The swelling is at the left knee, above her knee and below her knee to her calf. She states it doesn't feel like when she had the DVT, but it is sore and tender to touch. However, she denies redness in her legs and states both legs are about the same size.  She denies saddle anesthesia an can tell when she needs to go to the bathroom, but may not make it there in time because she cannot walk.  Her last dose of Eliquis  was last night She hasn't been taking the farxiga because it is making her sick.  She takes gabapentin  300mg  in the morning, 600mg  at lunch, 600mg  at night. She is not taking anything else for pain.

## 2024-07-11 ENCOUNTER — Other Ambulatory Visit: Payer: Self-pay

## 2024-07-11 ENCOUNTER — Observation Stay

## 2024-07-11 DIAGNOSIS — R202 Paresthesia of skin: Secondary | ICD-10-CM | POA: Diagnosis not present

## 2024-07-11 DIAGNOSIS — E876 Hypokalemia: Secondary | ICD-10-CM

## 2024-07-11 DIAGNOSIS — R200 Anesthesia of skin: Secondary | ICD-10-CM | POA: Diagnosis not present

## 2024-07-11 DIAGNOSIS — F32A Depression, unspecified: Secondary | ICD-10-CM | POA: Insufficient documentation

## 2024-07-11 DIAGNOSIS — I10 Essential (primary) hypertension: Secondary | ICD-10-CM | POA: Insufficient documentation

## 2024-07-11 DIAGNOSIS — F419 Anxiety disorder, unspecified: Secondary | ICD-10-CM | POA: Insufficient documentation

## 2024-07-11 DIAGNOSIS — I5032 Chronic diastolic (congestive) heart failure: Secondary | ICD-10-CM | POA: Insufficient documentation

## 2024-07-11 DIAGNOSIS — G20A1 Parkinson's disease without dyskinesia, without mention of fluctuations: Secondary | ICD-10-CM | POA: Insufficient documentation

## 2024-07-11 DIAGNOSIS — M21371 Foot drop, right foot: Secondary | ICD-10-CM | POA: Diagnosis not present

## 2024-07-11 DIAGNOSIS — K219 Gastro-esophageal reflux disease without esophagitis: Secondary | ICD-10-CM | POA: Insufficient documentation

## 2024-07-11 DIAGNOSIS — E1142 Type 2 diabetes mellitus with diabetic polyneuropathy: Secondary | ICD-10-CM | POA: Insufficient documentation

## 2024-07-11 LAB — BASIC METABOLIC PANEL WITH GFR
Anion gap: 10 (ref 5–15)
BUN: 8 mg/dL (ref 8–23)
CO2: 28 mmol/L (ref 22–32)
Calcium: 8.3 mg/dL — ABNORMAL LOW (ref 8.9–10.3)
Chloride: 103 mmol/L (ref 98–111)
Creatinine, Ser: 0.58 mg/dL (ref 0.44–1.00)
GFR, Estimated: >60 mL/min
Glucose, Bld: 113 mg/dL — ABNORMAL HIGH (ref 70–99)
Potassium: 3.1 mmol/L — ABNORMAL LOW (ref 3.5–5.1)
Sodium: 141 mmol/L (ref 135–145)

## 2024-07-11 LAB — CBC
HCT: 39.6 % (ref 36.0–46.0)
Hemoglobin: 12.2 g/dL (ref 12.0–15.0)
MCH: 27.1 pg (ref 26.0–34.0)
MCHC: 30.8 g/dL (ref 30.0–36.0)
MCV: 87.8 fL (ref 80.0–100.0)
Platelets: 265 K/uL (ref 150–400)
RBC: 4.51 MIL/uL (ref 3.87–5.11)
RDW: 14 % (ref 11.5–15.5)
WBC: 11.9 K/uL — ABNORMAL HIGH (ref 4.0–10.5)
nRBC: 0 % (ref 0.0–0.2)

## 2024-07-11 LAB — MAGNESIUM: Magnesium: 1.6 mg/dL — ABNORMAL LOW (ref 1.7–2.4)

## 2024-07-11 LAB — GLUCOSE, CAPILLARY: Glucose-Capillary: 157 mg/dL — ABNORMAL HIGH (ref 70–99)

## 2024-07-11 LAB — PHOSPHORUS: Phosphorus: 3.5 mg/dL (ref 2.5–4.6)

## 2024-07-11 MED ORDER — GABAPENTIN 300 MG PO CAPS
600.0000 mg | ORAL_CAPSULE | Freq: Every day | ORAL | Status: DC
  Administered 2024-07-11 – 2024-07-12 (×2): 600 mg via ORAL
  Filled 2024-07-11 (×2): qty 2

## 2024-07-11 MED ORDER — BUPROPION HCL ER (XL) 150 MG PO TB24
150.0000 mg | ORAL_TABLET | Freq: Every morning | ORAL | Status: DC
  Administered 2024-07-12 – 2024-07-13 (×2): 150 mg via ORAL
  Filled 2024-07-11 (×2): qty 1

## 2024-07-11 MED ORDER — ONDANSETRON HCL 4 MG PO TABS: 4.0000 mg | ORAL_TABLET | Freq: Four times a day (QID) | ORAL | Status: DC | PRN

## 2024-07-11 MED ORDER — METOCLOPRAMIDE HCL 10 MG PO TABS
10.0000 mg | ORAL_TABLET | Freq: Three times a day (TID) | ORAL | Status: DC | PRN
  Administered 2024-07-11 – 2024-07-13 (×3): 10 mg via ORAL
  Filled 2024-07-11 (×4): qty 1

## 2024-07-11 MED ORDER — ZOLPIDEM TARTRATE 5 MG PO TABS
5.0000 mg | ORAL_TABLET | Freq: Every evening | ORAL | Status: DC | PRN
  Administered 2024-07-11 – 2024-07-12 (×2): 5 mg via ORAL
  Filled 2024-07-11 (×2): qty 1

## 2024-07-11 MED ORDER — ENOXAPARIN SODIUM 60 MG/0.6ML IJ SOSY
50.0000 mg | PREFILLED_SYRINGE | Freq: Every evening | INTRAMUSCULAR | Status: DC
  Administered 2024-07-11: 50 mg via SUBCUTANEOUS
  Filled 2024-07-11: qty 0.6

## 2024-07-11 MED ORDER — SODIUM CHLORIDE 0.9 % IV SOLN: INTRAVENOUS | Status: AC

## 2024-07-11 MED ORDER — MAGNESIUM HYDROXIDE 400 MG/5ML PO SUSP: 30.0000 mL | Freq: Every day | ORAL | Status: DC | PRN

## 2024-07-11 MED ORDER — CARBIDOPA-LEVODOPA 25-100 MG PO TABS
2.0000 | ORAL_TABLET | Freq: Three times a day (TID) | ORAL | Status: DC
  Administered 2024-07-11 – 2024-07-13 (×7): 2 via ORAL
  Filled 2024-07-11 (×7): qty 2

## 2024-07-11 MED ORDER — GABAPENTIN 300 MG PO CAPS
600.0000 mg | ORAL_CAPSULE | Freq: Every day | ORAL | Status: DC
  Administered 2024-07-12 – 2024-07-13 (×2): 600 mg via ORAL
  Filled 2024-07-11 (×3): qty 2

## 2024-07-11 MED ORDER — ACETAMINOPHEN 325 MG PO TABS: 650.0000 mg | ORAL_TABLET | Freq: Four times a day (QID) | ORAL | Status: DC | PRN

## 2024-07-11 MED ORDER — GABAPENTIN 300 MG PO CAPS: 300.0000 mg | ORAL_CAPSULE | ORAL | Status: DC

## 2024-07-11 MED ORDER — CITALOPRAM HYDROBROMIDE 20 MG PO TABS
20.0000 mg | ORAL_TABLET | Freq: Every day | ORAL | Status: DC
  Administered 2024-07-11 – 2024-07-13 (×3): 20 mg via ORAL
  Filled 2024-07-11 (×4): qty 1

## 2024-07-11 MED ORDER — VITAMIN D (ERGOCALCIFEROL) 1.25 MG (50000 UNIT) PO CAPS
50000.0000 [IU] | ORAL_CAPSULE | ORAL | Status: DC
  Administered 2024-07-12: 50000 [IU] via ORAL
  Filled 2024-07-11: qty 1

## 2024-07-11 MED ORDER — ACETAMINOPHEN 650 MG RE SUPP: 650.0000 mg | Freq: Four times a day (QID) | RECTAL | Status: DC | PRN

## 2024-07-11 MED ORDER — POTASSIUM CHLORIDE 20 MEQ PO PACK
40.0000 meq | PACK | Freq: Once | ORAL | Status: AC
  Administered 2024-07-11: 40 meq via ORAL
  Filled 2024-07-11: qty 2

## 2024-07-11 MED ORDER — GABAPENTIN 300 MG PO CAPS
300.0000 mg | ORAL_CAPSULE | Freq: Every day | ORAL | Status: DC
  Administered 2024-07-11 – 2024-07-13 (×3): 300 mg via ORAL
  Filled 2024-07-11 (×3): qty 1

## 2024-07-11 MED ORDER — ONDANSETRON HCL 4 MG/2ML IJ SOLN: 4.0000 mg | Freq: Four times a day (QID) | INTRAMUSCULAR | Status: DC | PRN

## 2024-07-11 MED ORDER — POTASSIUM CHLORIDE CRYS ER 20 MEQ PO TBCR
40.0000 meq | EXTENDED_RELEASE_TABLET | ORAL | Status: AC
  Administered 2024-07-11: 40 meq via ORAL
  Filled 2024-07-11 (×2): qty 2

## 2024-07-11 MED ORDER — IRBESARTAN 75 MG PO TABS
37.5000 mg | ORAL_TABLET | Freq: Every day | ORAL | Status: DC
  Administered 2024-07-11 – 2024-07-13 (×3): 37.5 mg via ORAL
  Filled 2024-07-11 (×3): qty 0.5

## 2024-07-11 MED ORDER — SPIRONOLACTONE 25 MG PO TABS
25.0000 mg | ORAL_TABLET | Freq: Every morning | ORAL | Status: DC
  Administered 2024-07-11 – 2024-07-13 (×3): 25 mg via ORAL
  Filled 2024-07-11 (×3): qty 1

## 2024-07-11 MED ORDER — MORPHINE SULFATE (PF) 2 MG/ML IV SOLN
2.0000 mg | INTRAVENOUS | Status: DC | PRN
  Administered 2024-07-11 (×2): 2 mg via INTRAVENOUS
  Filled 2024-07-11 (×2): qty 1

## 2024-07-11 MED ORDER — POTASSIUM CHLORIDE CRYS ER 20 MEQ PO TBCR: 20.0000 meq | EXTENDED_RELEASE_TABLET | Freq: Two times a day (BID) | ORAL | Status: DC

## 2024-07-11 MED ORDER — MAGNESIUM SULFATE 2 GM/50ML IV SOLN
2.0000 g | Freq: Once | INTRAVENOUS | Status: AC
  Administered 2024-07-11: 2 g via INTRAVENOUS
  Filled 2024-07-11: qty 50

## 2024-07-11 NOTE — Assessment & Plan Note (Signed)
Continue Sinemet IR.

## 2024-07-11 NOTE — Assessment & Plan Note (Signed)
-   Will continue Ativan

## 2024-07-11 NOTE — Progress Notes (Signed)
 TOC Brief note  Patient admitted with c/o LLE numbness and tingling, likely radiculopathy. Neurology consulted.  The medical record has been reviewed. The patient is from home. The patient has a payor and PCP on record. No SDOH interventions needed.   No recs at this time, please outreach to [facility] if needs are identified.

## 2024-07-11 NOTE — Consult Note (Addendum)
 "   Consult requested by:  Dr. Lawence  Consult requested for:  R foot drop  Primary Physician:  Valora Lynwood FALCON, MD  History of Present Illness: 07/11/2024 [name] is here with a chief complaint of R foot weakness and L knee pain.  She is well to me, as she underwent right L4-5 microdiscectomy on April 25, 2024.  I have seen her in follow-up where she was noted to have worsening right foot drop.  I offered her intervention several weeks ago, but she declined at that time.  In the meantime, she has had worsening functional status at home.  She has worsening left knee pain.  She has history of bilateral knee arthroplasties.  She has never had severe left knee pain since her arthroplasty that she can recall.  She is very worried about falling.  She denies any bowel or bladder dysfunction that is new.  She has some numbness in her left lower leg but it is hard to quantify.   Review of Systems:  A 10 point review of systems is negative, except for the pertinent positives and negatives detailed in the HPI.  Past Medical History: Past Medical History:  Diagnosis Date   Anemia    Anxiety    a.) on BZO (diazepam) PRN   Aortic atherosclerosis    Arthritis    Asthma    Benign essential tremor    Bilateral thoracic back pain    CAD (coronary artery disease)    a.) MV 01/01/2019: EF 47%; reversible ant ischemia; b.) cCTA 01/18/2019: Ca2+ = 200; < 25% p-m LAD, diag, LCx, oRCA, p-dRCA - FFR CT analysis: LAD 0.87, LCx 0.95, RCA 0.91.   Cerebral microvascular disease    Claustrophobia    Complication of anesthesia    a.) delayed emergence; b.) PONV   Depression    Diastolic dysfunction 02/16/2017   a.) TTE 02/16/2017: EF >55%, triv MR/PR, mild TR, G1DD; b.) TTE 02/13/2019: EF 45%, mild LVH, septal and apical HK, triv MR, mild TR.   Dyspnea    Family history of adverse reaction to anesthesia    a.) delayed emergence in 1st degree female relative (son)   Fibromyalgia    Fusion of  spine of cervical region    Gastric ulcer    GERD (gastroesophageal reflux disease)    Hemorrhoids    Hepatic steatosis    History of motion sickness    History of recent fall 04/20/2024   History of seasonal allergies    History of sepsis    Hyperlipidemia    Hypertension    Hypothyroidism    Leukocytosis    Migraines    Obesity    Palpitations    Parkinson disease (HCC)    Plantar fasciitis (left) s/p release    PONV (postoperative nausea and vomiting)    Proliferative diabetic retinopathy (HCC)    Snores    a.) has had negative PSG testing   Spinal stenosis of cervical region with radiculopathy    a.) s/p ACDF C4-C7)   Spinal stenosis of lumbar region with radiculopathy    Spondylosis of lumbar spine without myelopathy    Statin myopathy    Status post carpal tunnel release of both wrists    Type 2 diabetes mellitus treated with insulin  (HCC)     Past Surgical History: Past Surgical History:  Procedure Laterality Date   ACDF C4-C7 WITH ALLOGRAFT INSERTION N/A 11/29/2012   ACHILLES TENDON LENGTHENING Bilateral 2010   ARTHRODESIS ANTERIOR CERVICAL  SPINE N/A 12/04/2012   BREAST BIOPSY Bilateral 1970's   BREAST BIOPSY Left    stereotactic   CARPAL TUNNEL RELEASE Bilateral    CESAREAN SECTION     COLONOSCOPY WITH PROPOFOL  N/A 04/14/2016   Procedure: COLONOSCOPY WITH PROPOFOL ;  Surgeon: Lamar ONEIDA Holmes, MD;  Location: [facility] ENDOSCOPY;  Service: Endoscopy;  Laterality: N/A;   COLONOSCOPY WITH PROPOFOL  N/A 02/15/2020   Procedure: COLONOSCOPY WITH PROPOFOL ;  Surgeon: Maryruth Ole ONEIDA, MD;  Location: [facility] ENDOSCOPY;  Service: Endoscopy;  Laterality: N/A;   ESOPHAGOGASTRODUODENOSCOPY (EGD) WITH PROPOFOL  N/A 04/14/2016   Procedure: ESOPHAGOGASTRODUODENOSCOPY (EGD) WITH PROPOFOL ;  Surgeon: Lamar ONEIDA Holmes, MD;  Location: [facility] ENDOSCOPY;  Service: Endoscopy;  Laterality: N/A;   ESOPHAGOGASTRODUODENOSCOPY (EGD) WITH PROPOFOL  N/A 02/15/2020   Procedure:  ESOPHAGOGASTRODUODENOSCOPY (EGD) WITH PROPOFOL ;  Surgeon: Maryruth Ole ONEIDA, MD;  Location: [facility] ENDOSCOPY;  Service: Endoscopy;  Laterality: N/A;   FLEXIBLE SIGMOIDOSCOPY N/A 11/03/2016   Procedure: FLEXIBLE SIGMOIDOSCOPY;  Surgeon: Holmes Lamar ONEIDA, MD;  Location: [facility] ENDOSCOPY;  Service: Endoscopy;  Laterality: N/A;   FLEXIBLE SIGMOIDOSCOPY N/A 01/31/2017   Procedure: FLEXIBLE SIGMOIDOSCOPY;  Surgeon: Holmes Lamar ONEIDA, MD;  Location: [facility] ENDOSCOPY;  Service: Endoscopy;  Laterality: N/A;   LUMBAR LAMINECTOMY/DECOMPRESSION MICRODISCECTOMY Right 04/25/2024   Procedure: LUMBAR LAMINECTOMY/DECOMPRESSION MICRODISCECTOMY 1 LEVEL;  Surgeon: Clois Fret, MD;  Location: [facility] ORS;  Service: Neurosurgery;  Laterality: Right;  RIGHT L4/5 MICRODISCECTOMY   NERVE SURGERY     on back   PLANTAR FASCIA SURGERY Left    SHOULDER ARTHROSCOPY Right    TONSILLECTOMY     TOTAL KNEE ARTHROPLASTY Right 11/27/2013   TOTAL KNEE ARTHROPLASTY Left 01/04/2022   Procedure: TOTAL KNEE ARTHROPLASTY;  Surgeon: Leora Lynwood SAUNDERS, MD;  Location: [facility] ORS;  Service: Orthopedics;  Laterality: Left;    Allergies: Allergies as of 07/10/2024 - Review Complete 07/10/2024  Allergen Reaction Noted   Cat dander Other (See Comments) and Itching 02/28/2013   Oxycodone Other (See Comments) 09/30/2015   Penicillin g Itching 04/03/2024   Statins Other (See Comments) 02/13/2020   Zosyn  [piperacillin  sod-tazobactam so] Swelling 11/03/2016    Medications: Active Medications[1]  Social History: Social History[2]  Family Medical History: Family History  Problem Relation Age of Onset   Breast cancer Mother 74   Hypertension Father    Cancer Sister        Hodkin's lymphoma    Physical Examination: Vitals:   07/11/24 0424 07/11/24 0746  BP: (!) 99/45 (!) 145/65  Pulse: 87 84  Resp: 18 16  Temp: 98 F (36.7 C) 97.9 F (36.6 C)  SpO2: 93% 95%    General: Patient is in no apparent distress. Attention to  examination is appropriate.  Neck:   Supple.  Full range of motion.  Respiratory: Patient is breathing without any difficulty.   NEUROLOGICAL:     Awake, alert, oriented to person, place, and time.  Speech is clear and fluent.  Cranial Nerves: Pupils equal round and reactive to light.  Facial tone is symmetric.  Facial sensation is symmetric. Shoulder shrug is symmetric. Tongue protrusion is midline.  There is no pronator drift.  Strength:  5/5 BUE   Side Iliopsoas Quads Hamstring PF DF EHL  R 5 5 5 5 1 2   L 3 4 5 5 5 5    She is unable to evert or invert at her right ankle.    Hoffman's is absent.   Bilateral upper and lower extremity sensation is symmetric to light touch.   Diminished below  the knees. No evidence of dysmetria noted.  Gait is untested.     Medical Decision Making  Imaging: MRI TL spine 07/10/2024 IMPRESSION: 1. Small disc protrusions at T5-6 and T11-12 as above without significant stenosis or overt neural impingement. 2. Mild dextroscoliosis with intermittent mild facet degeneration within the thoracic spine. Finding could contribute to back pain. 3. No other acute abnormality.     Electronically Signed   By: Morene Hoard M.D.   On: 07/10/2024 23:37  IMPRESSION: 1. Postoperative changes from prior right hemi laminectomy with micro discectomy at L4-5. Previously seen postoperative collection at the laminectomy defect has resolved in the interim. Residual mild to moderate narrowing of the lateral recesses at this level, improved as compared to prior MRI. 2. Degenerative reactive endplate change about the L4-5 interspace, extending to involve the bilateral L4-5 facets. Finding could contribute to back pain. 3. Mild disc bulging with facet hypertrophy at L3-4 with resultant mild narrowing of the lateral recesses.     Electronically Signed   By: Morene Hoard M.D.   On: 07/10/2024 23:47  I have personally reviewed the images  and agree with the above interpretation.  Assessment and Plan: [name] is a pleasant 69 y.o. female with new left knee pain and a dense right foot drop.  Her foot drop is most consistent with a right L5 motor radiculopathy.  She has history of a left knee total arthroplasty.  I would like to get x-rays of that given her tenderness to palpation around her left knee joint.  Her MRI findings have improved.  There is some chance that she will recover in the long run and that there is no longer significant compression of her right L5 nerve root.  I would like to engage my colleague from neurology to see if he has any thoughts, and whether nerve conduction study might show some possibility of reinnervation.  I think she would benefit from working with physical and Occupational Therapy.  She may benefit from an ankle-foot orthotic.  We will discuss reexploration tomorrow.  Given that she was on Eliquis  until yesterday, I would not pursue any operative intervention before Friday, January 16.    I have communicated my recommendations to the requesting physician and coordinated care to facilitate these recommendations.     Camarie Mctigue K. Clois MD, MPHS Neurosurgery     [1]  Current Meds  Medication Sig   buPROPion  (WELLBUTRIN  XL) 150 MG 24 hr tablet Take 150 mg by mouth in the morning.   carbidopa -levodopa  (SINEMET  IR) 25-100 MG tablet Take 2 tablets by mouth in the morning, at noon, and at bedtime.   citalopram  (CELEXA ) 20 MG tablet Take 20 mg by mouth at bedtime.   LORazepam  (ATIVAN ) 0.5 MG tablet Take 1 tablet (0.5 mg total) by mouth every 8 (eight) hours as needed for anxiety (nausea).   metoCLOPramide  (REGLAN ) 10 MG tablet Take 1 tablet (10 mg total) by mouth every 8 (eight) hours as needed for nausea or vomiting.   olmesartan (BENICAR) 40 MG tablet Take 40 mg by mouth 2 (two) times daily.   spironolactone  (ALDACTONE ) 25 MG tablet Take 25 mg by mouth in the morning.   Vitamin D ,  Ergocalciferol , (DRISDOL ) 1.25 MG (50000 UNIT) CAPS capsule Take 50,000 Units by mouth once a week.   zolpidem  (AMBIEN ) 5 MG tablet Take 1 tablet (5 mg total) by mouth at bedtime as needed for sleep.  [2]  Social History Tobacco Use   Smoking status: Former  Current packs/day: 0.00    Average packs/day: 2.0 packs/day for 35.0 years (70.0 ttl pk-yrs)    Types: Cigarettes    Start date: 07/01/1965    Quit date: 07/01/2000    Years since quitting: 24.0   Smokeless tobacco: Never  Vaping Use   Vaping status: Never Used  Substance Use Topics   Alcohol use: No   Drug use: No   "

## 2024-07-11 NOTE — Assessment & Plan Note (Addendum)
-   This is likely radiculopathy due to lumbar disc disease. - The patient be admitted to a medical/surgical observation bed. - Neurosurgery consult will be obtained. - Will hold off Eliquis . - Dr. Clois is aware about the patient. - The patient will be kept n.p.o. after midnight.

## 2024-07-11 NOTE — Plan of Care (Signed)

## 2024-07-11 NOTE — Progress Notes (Signed)
 Pharmacy Lovenox  Dosing  69 y.o. female admitted with Numbness . Patient ordered Lovenox  40 mg daily for VTE prophylaxis.   Filed Weights   07/10/24 1545 07/11/24 0203  Weight: 108.8 kg (239 lb 13.8 oz) 108.8 kg (239 lb 13.8 oz)    Body mass index is 41.17 kg/m.  Estimated Creatinine Clearance: 78.8 mL/min (by C-G formula based on SCr of 0.58 mg/dL).  Will adjust Lovenox  dosing to 50 mg daily for BMI > 30.  Bari Hamilton D 07/11/2024 4:40 PM

## 2024-07-11 NOTE — Assessment & Plan Note (Signed)
-   The patient be placed on supplemental coverage with NovoLog . - Will continue basal coverage. - Will continue Neurontin .

## 2024-07-11 NOTE — Evaluation (Signed)
 Occupational Therapy Evaluation Patient Details Name: [name] MRN: [id number] DOB: 03-19-1956 Today's Date: 07/11/2024   History of Present Illness   Pt is a 69 y/o F admitted on 07/10/24 after presenting with c/o acute onset of LLE numbness & inability to bear weight. PT with recent L4-5 discectomy on 04/25/24. Symptoms are consistent with L5 motor radiculopathy. PMH: anxiety, depression, asthma, benign essential tremor, CAD, fibromyalgia, HTN, dyslipidemia, hypothyroidism, migraine, Parkinson's disease, GERD, DM2     Clinical Impressions Pt was seen for OT evaluation this date. Prior to hospital admission, pt was recently using 4ww. Pt lives spouse and adult son with disabilities, pt is caregiver. Pt presents to acute OT demonstrating impaired ADL performance and functional mobility 2/2 decreased activity tolerance and functional strength/ROM/balance deficits. Pt currently requires CGA + rw for chair>bed step pivot t/f, declines further distance citing L knee pain and recent bout of nausea. Anticipate Mod A for LB dressing.  Pt would benefit from skilled OT to address noted impairments and functional limitations (see below for any additional details). Upon hospital discharge, recommend high intensity post acute OT >3hours/day to maximize pt safety and return to PLOF.      If plan is discharge home, recommend the following:   A lot of help with walking and/or transfers;A lot of help with bathing/dressing/bathroom;Assistance with cooking/housework;Assist for transportation;Help with stairs or ramp for entrance     Functional Status Assessment   Patient has had a recent decline in their functional status and demonstrates the ability to make significant improvements in function in a reasonable and predictable amount of time.     Equipment Recommendations   Other (comment) (rw)     Recommendations for Other Services         Precautions/Restrictions    Precautions Precautions: Fall Recall of Precautions/Restrictions: Intact Precaution/Restrictions Comments: R foot drop Restrictions Weight Bearing Restrictions Per Provider Order: No     Mobility Bed Mobility Overal bed mobility: Needs Assistance Bed Mobility: Sit to Supine       Sit to supine: Mod assist        Transfers Overall transfer level: Needs assistance Equipment used: Rolling walker (2 wheels) Transfers: Sit to/from Stand, Bed to chair/wheelchair/BSC Sit to Stand: Contact guard assist     Step pivot transfers: Contact guard assist            Balance Overall balance assessment: Needs assistance Sitting-balance support: Feet supported Sitting balance-Leahy Scale: Fair     Standing balance support: During functional activity, Bilateral upper extremity supported, Reliant on assistive device for balance Standing balance-Leahy Scale: Fair                             ADL either performed or assessed with clinical judgement   ADL Overall ADL's : Needs assistance/impaired                                       General ADL Comments: CGA + rw for simulated BSC transfer. set up simulated UB dressing. Anticipate Mod A for LB dressing.     Vision         Perception         Praxis         Pertinent Vitals/Pain Pain Assessment Pain Assessment: Faces Faces Pain Scale: Hurts even more Pain Location: L knee Pain Descriptors / Indicators: Discomfort,  Aching, Grimacing, Guarding Pain Intervention(s): Premedicated before session, Monitored during session     Extremity/Trunk Assessment Upper Extremity Assessment Upper Extremity Assessment: Overall WFL for tasks assessed   Lower Extremity Assessment Lower Extremity Assessment: Defer to PT evaluation       Communication Communication Communication: No apparent difficulties   Cognition Arousal: Alert Behavior During Therapy: WFL for tasks assessed/performed,  Anxious Cognition: No apparent impairments                               Following commands: Intact       Cueing  General Comments   Cueing Techniques: Verbal cues      Exercises     Shoulder Instructions      Home Living Family/patient expects to be discharged to:: Private residence Living Arrangements: Spouse/significant other;Children Available Help at Discharge: Family;Available PRN/intermittently Type of Home: House Home Access: Stairs to enter;Ramped entrance Entrance Stairs-Number of Steps: 6   Home Layout: One level     Bathroom Shower/Tub: Producer, Television/film/video: Handicapped height     Home Equipment: Rollator (4 wheels);BSC/3in1;Wheelchair - manual          Prior Functioning/Environment Prior Level of Function : History of Falls (last six months);Independent/Modified Independent             Mobility Comments: Prior to surgery in October pt was independent without AD, since sx pt was ambulating with rollator, receiving HHPT services, notes 3 falls in the past 6 months. 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    OT Problem List: Decreased activity tolerance;Pain;Decreased range of motion   OT Treatment/Interventions: Self-care/ADL training;Therapeutic exercise;Therapeutic activities;Patient/family education      OT Goals(Current goals can be found in the care plan section)   Acute Rehab OT Goals Patient Stated Goal: improve pain OT Goal Formulation: With patient Time For Goal Achievement: 07/25/24 Potential to Achieve Goals: Good ADL Goals Pt Will Perform Grooming: with modified independence;standing (tolerate ) Pt Will Perform Lower Body Dressing: with contact guard assist;sit to/from stand;with adaptive equipment Pt Will Transfer to Toilet: with modified independence;ambulating;regular height toilet   OT Frequency:  Min 2X/week    Co-evaluation              AM-PAC OT 6 Clicks  Daily Activity     Outcome Measure Help from another person eating meals?: None Help from another person taking care of personal grooming?: A Little Help from another person toileting, which includes using toliet, bedpan, or urinal?: A Little Help from another person bathing (including washing, rinsing, drying)?: A Lot Help from another person to put on and taking off regular upper body clothing?: A Little Help from another person to put on and taking off regular lower body clothing?: A Lot 6 Click Score: 17   End of Session Equipment Utilized During Treatment: Rolling walker (2 wheels)  Activity Tolerance: Patient tolerated treatment well Patient left: in bed;with call bell/phone within reach  OT Visit Diagnosis: History of falling (Z91.81);Muscle weakness (generalized) (M62.81)                Time: 9768-9754 OT Time Calculation (min): 14 min Charges:  OT General Charges $OT Visit: 1 Visit OT Evaluation $OT Eval Low Complexity: 1 Low  Jesper Stirewalt OTS   Ronita Sauers 07/11/2024, 3:26 PM

## 2024-07-11 NOTE — Assessment & Plan Note (Signed)
-   Will continue Farxiga and Aldactone .

## 2024-07-11 NOTE — Assessment & Plan Note (Signed)
-   Will continue Wellbutrin  XL and Celexa .

## 2024-07-11 NOTE — Assessment & Plan Note (Addendum)
-   Will continue antihypertensive therapy.

## 2024-07-11 NOTE — H&P (Addendum)
 "     [place]   PATIENT NAME: [name]    MR#:  [id number]  DATE OF BIRTH:  1956-05-17  DATE OF ADMISSION:  07/10/2024  PRIMARY CARE PHYSICIAN: Valora Lynwood FALCON, MD   Patient is coming from: Home  REQUESTING/REFERRING PHYSICIAN: Dr. Waymond  CHIEF COMPLAINT:   Chief Complaint  Patient presents with   Numbness    HISTORY OF PRESENT ILLNESS:  [name] is a 69 y.o. female with medical history significant for anxiety, depression, asthma, benign essential tremor, coronary artery disease, fibromyalgia, hypertension, dyslipidemia, hypothyroidism, migraine, Parkinson's disease, GERD and type 2 diabetes mellitus, who presented to the emergency room with acute onset of left leg numbness with inability to bear weight on.  The patient had a recent L4-L5 discectomy and was seen today by Dr. Katrina who sent her to the emergency room for potential need for surgical intervention in a.m. the patient denied any other paresthesias or focal muscle weakness.  No urinary or stool incontinence or pelvic tingling or numbness.  No fever or chills.  No chest pain or palpitations.  No nausea or vomiting or abdominal pain.  No dysuria, oliguria or hematuria or flank pain.  ED Course: When she came to the ER, BP was 174/61 with otherwise normal vital signs.  Labs revealed hypokalemia of 2.9 with blood glucose of 155 and calcium 8.7.  CBC showed leukocytosis 12.7 with neutrophilia. EKG as reviewed by me : None. Imaging: Lumbar spine MRI showed the following: 1. Postoperative changes from prior right hemi laminectomy with micro discectomy at L4-5. Previously seen postoperative collection at the laminectomy defect has resolved in the interim. Residual mild to moderate narrowing of the lateral recesses at this level, improved as compared to prior MRI. 2. Degenerative reactive endplate change about the L4-5 interspace, extending to involve the bilateral L4-5 facets. Finding could contribute to back pain. 3.  Mild disc bulging with facet hypertrophy at L3-4 with resultant mild narrowing of the lateral recesses.  Thoracic spine MRI showed the following: 1. Small disc protrusions at T5-6 and T11-12 as above without significant stenosis or overt neural impingement. 2. Mild dextroscoliosis with intermittent mild facet degeneration within the thoracic spine. Finding could contribute to back pain. 3. No other acute abnormality.  The patient was given 40 mcg p.o. potassium chloride  and I added another dose and 1 p.o. Norco.  She will be admitted to an observation medical-surgical bed for further evaluation and management.   PAST MEDICAL HISTORY:   Past Medical History:  Diagnosis Date   Anemia    Anxiety    a.) on BZO (diazepam) PRN   Aortic atherosclerosis    Arthritis    Asthma    Benign essential tremor    Bilateral thoracic back pain    CAD (coronary artery disease)    a.) MV 01/01/2019: EF 47%; reversible ant ischemia; b.) cCTA 01/18/2019: Ca2+ = 200; < 25% p-m LAD, diag, LCx, oRCA, p-dRCA - FFR CT analysis: LAD 0.87, LCx 0.95, RCA 0.91.   Cerebral microvascular disease    Claustrophobia    Complication of anesthesia    a.) delayed emergence; b.) PONV   Depression    Diastolic dysfunction 02/16/2017   a.) TTE 02/16/2017: EF >55%, triv MR/PR, mild TR, G1DD; b.) TTE 02/13/2019: EF 45%, mild LVH, septal and apical HK, triv MR, mild TR.   Dyspnea    Family history of adverse reaction to anesthesia    a.) delayed emergence in 1st degree female  relative (son)   Fibromyalgia    Fusion of spine of cervical region    Gastric ulcer    GERD (gastroesophageal reflux disease)    Hemorrhoids    Hepatic steatosis    History of motion sickness    History of recent fall 04/20/2024   History of seasonal allergies    History of sepsis    Hyperlipidemia    Hypertension    Hypothyroidism    Leukocytosis    Migraines    Obesity    Palpitations    Parkinson disease (HCC)    Plantar fasciitis  (left) s/p release    PONV (postoperative nausea and vomiting)    Proliferative diabetic retinopathy (HCC)    Snores    a.) has had negative PSG testing   Spinal stenosis of cervical region with radiculopathy    a.) s/p ACDF C4-C7)   Spinal stenosis of lumbar region with radiculopathy    Spondylosis of lumbar spine without myelopathy    Statin myopathy    Status post carpal tunnel release of both wrists    Type 2 diabetes mellitus treated with insulin  (HCC)     PAST SURGICAL HISTORY:   Past Surgical History:  Procedure Laterality Date   ACDF C4-C7 WITH ALLOGRAFT INSERTION N/A 11/29/2012   ACHILLES TENDON LENGTHENING Bilateral 2010   ARTHRODESIS ANTERIOR CERVICAL SPINE N/A 12/04/2012   BREAST BIOPSY Bilateral 1970's   BREAST BIOPSY Left    stereotactic   CARPAL TUNNEL RELEASE Bilateral    CESAREAN SECTION     COLONOSCOPY WITH PROPOFOL  N/A 04/14/2016   Procedure: COLONOSCOPY WITH PROPOFOL ;  Surgeon: Lamar ONEIDA Holmes, MD;  Location: [facility] ENDOSCOPY;  Service: Endoscopy;  Laterality: N/A;   COLONOSCOPY WITH PROPOFOL  N/A 02/15/2020   Procedure: COLONOSCOPY WITH PROPOFOL ;  Surgeon: Maryruth Ole ONEIDA, MD;  Location: [facility] ENDOSCOPY;  Service: Endoscopy;  Laterality: N/A;   ESOPHAGOGASTRODUODENOSCOPY (EGD) WITH PROPOFOL  N/A 04/14/2016   Procedure: ESOPHAGOGASTRODUODENOSCOPY (EGD) WITH PROPOFOL ;  Surgeon: Lamar ONEIDA Holmes, MD;  Location: [facility] ENDOSCOPY;  Service: Endoscopy;  Laterality: N/A;   ESOPHAGOGASTRODUODENOSCOPY (EGD) WITH PROPOFOL  N/A 02/15/2020   Procedure: ESOPHAGOGASTRODUODENOSCOPY (EGD) WITH PROPOFOL ;  Surgeon: Maryruth Ole ONEIDA, MD;  Location: [facility] ENDOSCOPY;  Service: Endoscopy;  Laterality: N/A;   FLEXIBLE SIGMOIDOSCOPY N/A 11/03/2016   Procedure: FLEXIBLE SIGMOIDOSCOPY;  Surgeon: Holmes Lamar ONEIDA, MD;  Location: [facility] ENDOSCOPY;  Service: Endoscopy;  Laterality: N/A;   FLEXIBLE SIGMOIDOSCOPY N/A 01/31/2017   Procedure: FLEXIBLE SIGMOIDOSCOPY;  Surgeon: Holmes Lamar ONEIDA, MD;  Location: [facility] ENDOSCOPY;  Service: Endoscopy;  Laterality: N/A;   LUMBAR LAMINECTOMY/DECOMPRESSION MICRODISCECTOMY Right 04/25/2024   Procedure: LUMBAR LAMINECTOMY/DECOMPRESSION MICRODISCECTOMY 1 LEVEL;  Surgeon: Clois Fret, MD;  Location: [facility] ORS;  Service: Neurosurgery;  Laterality: Right;  RIGHT L4/5 MICRODISCECTOMY   NERVE SURGERY     on back   PLANTAR FASCIA SURGERY Left    SHOULDER ARTHROSCOPY Right    TONSILLECTOMY     TOTAL KNEE ARTHROPLASTY Right 11/27/2013   TOTAL KNEE ARTHROPLASTY Left 01/04/2022   Procedure: TOTAL KNEE ARTHROPLASTY;  Surgeon: Leora Lynwood SAUNDERS, MD;  Location: [facility] ORS;  Service: Orthopedics;  Laterality: Left;    SOCIAL HISTORY:   Social History   Tobacco Use   Smoking status: Former    Current packs/day: 0.00    Average packs/day: 2.0 packs/day for 35.0 years (70.0 ttl pk-yrs)    Types: Cigarettes    Start date: 07/01/1965    Quit date: 07/01/2000    Years since quitting: 24.0   Smokeless tobacco:  Never  Substance Use Topics   Alcohol use: No    FAMILY HISTORY:   Family History  Problem Relation Age of Onset   Breast cancer Mother 40   Hypertension Father    Cancer Sister        Hodkin's lymphoma    DRUG ALLERGIES:  Allergies[1]  REVIEW OF SYSTEMS:   ROS As per history of present illness. All pertinent systems were reviewed above. Constitutional, HEENT, cardiovascular, respiratory, GI, GU, musculoskeletal, neuro, psychiatric, endocrine, integumentary and hematologic systems were reviewed and are otherwise negative/unremarkable except for positive findings mentioned above in the HPI.   MEDICATIONS AT HOME:   Prior to Admission medications  Medication Sig Start Date End Date Taking? Authorizing Provider  buPROPion  (WELLBUTRIN  XL) 150 MG 24 hr tablet Take 150 mg by mouth in the morning.   Yes [provider]  carbidopa -levodopa  (SINEMET  IR) 25-100 MG tablet Take 2 tablets by mouth in the morning, at noon, and at  bedtime.   Yes [provider]  citalopram  (CELEXA ) 20 MG tablet Take 20 mg by mouth at bedtime. 11/07/20  Yes [provider]  LORazepam  (ATIVAN ) 0.5 MG tablet Take 1 tablet (0.5 mg total) by mouth every 8 (eight) hours as needed for anxiety (nausea). 07/05/24 07/05/25 Yes Floy Roberts, MD  metoCLOPramide  (REGLAN ) 10 MG tablet Take 1 tablet (10 mg total) by mouth every 8 (eight) hours as needed for nausea or vomiting. 07/05/24 07/05/25 Yes Goodman, Graydon, MD  olmesartan (BENICAR) 40 MG tablet Take 40 mg by mouth 2 (two) times daily. 09/25/20 04/20/27 Yes [provider]  spironolactone  (ALDACTONE ) 25 MG tablet Take 25 mg by mouth in the morning. 12/10/21  Yes [provider]  Vitamin D , Ergocalciferol , (DRISDOL ) 1.25 MG (50000 UNIT) CAPS capsule Take 50,000 Units by mouth once a week. 06/23/24  Yes [provider]  zolpidem  (AMBIEN ) 5 MG tablet Take 1 tablet (5 mg total) by mouth at bedtime as needed for sleep. 06/27/24  Yes Von Bellis, MD  apixaban  (ELIQUIS ) 5 MG TABS tablet Take 1 tablet (5 mg total) by mouth 2 (two) times daily. Patient not taking: Reported on 07/10/2024 05/10/24   Claudene Rover, MD  dapagliflozin propanediol (FARXIGA) 10 MG TABS tablet Take 10 mg by mouth daily. Patient not taking: Reported on 07/10/2024 06/08/24 06/08/25  [provider]  gabapentin  (NEURONTIN ) 300 MG capsule 300mg  po q morning, 600mg  po at lunch, 600mg  q hs. 05/08/24   Hilma Hastings, PA-C  insulin  degludec (TRESIBA) 100 UNIT/ML FlexTouch Pen Inject 10 Units into the skin at bedtime. Patient not taking: Reported on 07/10/2024 10/26/23   [provider]  ondansetron  (ZOFRAN -ODT) 8 MG disintegrating tablet Take 1 tablet (8 mg total) by mouth every 8 (eight) hours as needed for nausea or vomiting. Patient not taking: Reported on 07/10/2024 06/25/24   Bradler, Evan K, MD  pantoprazole  (PROTONIX ) 40 MG tablet Take 40 mg by mouth at bedtime. Patient not taking:  Reported on 07/10/2024 04/01/15   [provider]  polyethylene glycol powder (SMOOTH LAX) 17 GM/SCOOP powder Take 17 g by mouth daily as needed. Dissolve 1 capful (17g) in 4-8 ounces of liquid and take by mouth daily. Patient not taking: Reported on 07/10/2024 04/25/24   Gregory Edsel Ruth, PA  potassium chloride  SA (KLOR-CON  M) 20 MEQ tablet Take 20 mEq by mouth 2 (two) times daily. Patient not taking: Reported on 07/10/2024 05/28/24 05/28/25  [provider]  senna (SENOKOT) 8.6 MG TABS tablet Take 1  tablet (8.6 mg total) by mouth 2 (two) times daily as needed. Patient not taking: Reported on 07/10/2024 04/25/24   Gregory Edsel Ruth, PA  torsemide (DEMADEX) 20 MG tablet Take 20 mg by mouth daily. Patient not taking: Reported on 07/10/2024 05/28/24 05/28/25  [provider]      VITAL SIGNS:  Blood pressure (!) 99/45, pulse 87, temperature 98 F (36.7 C), resp. rate 18, height 5' 4 (1.626 m), weight 108.8 kg, SpO2 93%.  PHYSICAL EXAMINATION:  Physical Exam  GENERAL:  69 y.o.-year-old patient lying in the bed with no acute distress.  EYES: Pupils equal, round, reactive to light and accommodation. No scleral icterus. Extraocular muscles intact.  HEENT: Head atraumatic, normocephalic. Oropharynx and nasopharynx clear.  NECK:  Supple, no jugular venous distention. No thyroid  enlargement, no tenderness.  LUNGS: Normal breath sounds bilaterally, no wheezing, rales,rhonchi or crepitation. No use of accessory muscles of respiration.  CARDIOVASCULAR: Regular rate and rhythm, S1, S2 normal. No murmurs, rubs, or gallops.  ABDOMEN: Soft, nondistended, nontender. Bowel sounds present. No organomegaly or mass.  EXTREMITIES: No pedal edema, cyanosis, or clubbing.  NEUROLOGIC: Cranial nerves II through XII are intact. Muscle strength 5/5 in all extremities except for the left lower extremity but it was 3-4/5. Sensation intact throughout except for diminished sensory exam to light touch  in the left leg.. Gait not checked.  PSYCHIATRIC: The patient is alert and oriented x 3.  Normal affect and good eye contact. Musculoskeletal: Positive left straight leg raising test at 20 degree. SKIN: No obvious rash, lesion, or ulcer.   LABORATORY PANEL:   CBC Recent Labs  Lab 07/11/24 0427  WBC 11.9*  HGB 12.2  HCT 39.6  PLT 265   ------------------------------------------------------------------------------------------------------------------  Chemistries  Recent Labs  Lab 07/05/24 1632 07/10/24 2048 07/11/24 0427  NA 138   < > 141  K 3.7   < > 3.1*  CL 99   < > 103  CO2 25   < > 28  GLUCOSE 111*   < > 113*  BUN 9   < > 8  CREATININE 0.82   < > 0.58  CALCIUM 9.5   < > 8.3*  AST 31  --   --   ALT 24  --   --   ALKPHOS 102  --   --   BILITOT 0.4  --   --    < > = values in this interval not displayed.   ------------------------------------------------------------------------------------------------------------------  Cardiac Enzymes No results for input(s): TROPONINI in the last 168 hours. ------------------------------------------------------------------------------------------------------------------  RADIOLOGY:  MR LUMBAR SPINE WO CONTRAST Result Date: 07/10/2024 CLINICAL DATA:  Initial evaluation for acute back pain. EXAM: MRI LUMBAR SPINE WITHOUT CONTRAST TECHNIQUE: Multiplanar, multisequence MR imaging of the lumbar spine was performed. No intravenous contrast was administered. COMPARISON:  Prior MRI from 05/23/2024. FINDINGS: Segmentation: Standard. Lowest well-formed disc space labeled the L5-S1 level. Alignment: Straightening of the normal lumbar lordosis. No significant listhesis. Vertebrae: Vertebral body height maintained without acute or chronic fracture. Bone marrow signal intensity within normal limits. No worrisome osseous lesions. Degenerative reactive endplate change with marrow edema present about the L4-5 interspace, worse on the right, mildly  progressed from prior. Reactive marrow edema present about the L4-5 facets bilaterally due to facet arthritis. Conus medullaris and cauda equina: Conus extends to the T12-L1 level. Conus and cauda equina appear normal. Paraspinal and other soft tissues: Sequelae of prior right hemi laminectomy with micro discectomy at L4-5. Normal expected postoperative changes within  the posterior paraspinous soft tissues. Previously seen collection at the laminectomy defect is largely resolved. Few small T2 hyperintense cyst noted about the kidneys, benign in appearance, no follow-up imaging recommended. Disc levels: L1-2: Mild endplate spurring without significant disc bulge. No stenosis. L2-3: Degenerative disc space narrowing with disc desiccation diffuse disc bulge. Reactive endplate spurring, greater on the right. No spinal stenosis. Foramina remain patent. L3-4: Disc desiccation with mild disc bulge. Endplate spurring. Mild bilateral facet hypertrophy. Resultant mild narrowing of the lateral recesses, slightly greater on the right. Central canal remains patent. No significant foraminal stenosis. L4-5: Degenerative vertebral disc space narrowing with disc desiccation diffuse disc bulge. Disc bulging asymmetric to the right, with right greater than left reactive endplate spurring. Postoperative changes from prior right hemi laminectomy with micro discectomy. Probable postoperative granulation tissue within the right lateral epidural space. Evaluation for residual recurrent disc protrusion limited on this noncontrast examination. Mild to moderate bilateral facet arthrosis. Residual mild to moderate narrowing of the lateral recesses at this level. Central canal remains patent. Stenosis is at this level is improved as compared to prior MRI. Mild right L4 foraminal stenosis. Left neural foramina remains patent. L5-S1: Disc desiccation with mild disc bulge. Reactive endplate spurring. Mild bilateral facet hypertrophy. No spinal  stenosis. Mild left L5 foraminal narrowing. Right neural foramina remains patent. IMPRESSION: 1. Postoperative changes from prior right hemi laminectomy with micro discectomy at L4-5. Previously seen postoperative collection at the laminectomy defect has resolved in the interim. Residual mild to moderate narrowing of the lateral recesses at this level, improved as compared to prior MRI. 2. Degenerative reactive endplate change about the L4-5 interspace, extending to involve the bilateral L4-5 facets. Finding could contribute to back pain. 3. Mild disc bulging with facet hypertrophy at L3-4 with resultant mild narrowing of the lateral recesses. Electronically Signed   By: Morene Hoard M.D.   On: 07/10/2024 23:47   MR THORACIC SPINE WO CONTRAST Result Date: 07/10/2024 CLINICAL DATA:  Initial evaluation for acute mid back pain. EXAM: MRI THORACIC SPINE WITHOUT CONTRAST TECHNIQUE: Multiplanar, multisequence MR imaging of the thoracic spine was performed. No intravenous contrast was administered. COMPARISON:  Prior MRI from 05/23/2024. FINDINGS: Alignment: Mild dextroscoliosis. Alignment otherwise normal with preservation of the normal cervical lordosis. No listhesis. Vertebrae: Vertebral body height maintained without acute or chronic fracture. Bone marrow signal intensity within normal limits. Few small benign hemangiomata noted. No worrisome osseous lesions. No abnormal marrow edema. Cord:  Normal signal and morphology. Paraspinal and other soft tissues: Paraspinous soft tissues within normal limits. Small T2 hyperintense right renal cysts noted, benign in appearance, no follow-up imaging recommended. Disc levels: Postoperative changes noted within the cervical spine on counter sequence. T5-6: Small right paracentral disc protrusion minimally flattens the right ventral thecal sac without stenosis or impingement. T11-12: Small left paracentral disc protrusion with endplate spurring mildly indents the ventral  thecal sac. No spinal stenosis. Foramina remain patent. Otherwise, no other significant disc pathology seen elsewhere within the thoracic spine for age. No other significant disc bulge or focal disc herniation. Mild intermittent facet degeneration noted. Mild diffuse prominence of the dorsal epidural fat. No significant spinal stenosis. Foramina remain patent. Overall, appearance of the thoracic spine is not significantly changed from prior. IMPRESSION: 1. Small disc protrusions at T5-6 and T11-12 as above without significant stenosis or overt neural impingement. 2. Mild dextroscoliosis with intermittent mild facet degeneration within the thoracic spine. Finding could contribute to back pain. 3. No other acute abnormality. Electronically  Signed   By: Morene Hoard M.D.   On: 07/10/2024 23:37      IMPRESSION AND PLAN:  Assessment and Plan: * Numbness and tingling of left lower extremity - This is likely radiculopathy due to lumbar disc disease. - The patient be admitted to a medical/surgical observation bed. - Neurosurgery consult will be obtained. - Will hold off Eliquis . - Dr. Clois is aware about the patient. - The patient will be kept n.p.o. after midnight.  Hypokalemia - Will replace potassium and check magnesium  level.  Type 2 diabetes mellitus with peripheral neuropathy (HCC) - The patient be placed on supplemental coverage with NovoLog . - Will continue basal coverage. - Will continue Neurontin .  Chronic heart failure with preserved ejection fraction (HFpEF) (HCC) - Will continue Farxiga and Aldactone .  GERD without esophagitis - Will continue PPI therapy.  Anxiety - Will continue Ativan .  Essential hypertension - Will continue antihypertensive therapy.  Depression - Will continue Wellbutrin  XL and Celexa .  Parkinson's disease (HCC) - Continue Sinemet  IR.   DVT prophylaxis: SCDs for now. Advanced Care Planning:  Code Status: full code. Family  Communication:  The plan of care was discussed in details with the patient (and family). I answered all questions. The patient agreed to proceed with the above mentioned plan. Further management will depend upon hospital course. Disposition Plan: Back to previous home environment Consults called: Neurosurgery All the records are reviewed and case discussed with ED provider.  Status is: Observation  I certify that at the time of admission, it is my clinical judgment that the patient will require hospital care extending less than 2 midnights.                            Dispo: The patient is from: Home              Anticipated d/c is to: Home              Patient currently is not medically stable to d/c.              Difficult to place patient: No  Madison DELENA Peaches M.D on 07/11/2024 at 6:07 AM  [facility]   From 7 PM-7 AM, contact night-coverage [email or web address]  CC: Primary care physician; Valora Lynwood FALCON, MD     [1]  Allergies Allergen Reactions   Cat Dander Other (See Comments) and Itching    Congestion, watery eyes    Oxycodone Other (See Comments)    Withdrawal symptoms after stopping it from last knee surgery   Penicillin G Itching   Statins Other (See Comments)   Zosyn  [Piperacillin  Sod-Tazobactam So] Swelling    Face got red and hot    "

## 2024-07-11 NOTE — Assessment & Plan Note (Signed)
-   Will continue PPI therapy.

## 2024-07-11 NOTE — Progress Notes (Signed)
" ° °  Inpatient Rehab Admissions Coordinator :  Per therapy recommendations, patient was screened for CIR candidacy by Heron Leavell RN MSN. I await further medical/surgical workup to determine medical neccesity for a possible CIR admit. I will follow.  Heron Leavell RN MSN Admissions Coordinator [phone]  "

## 2024-07-11 NOTE — Evaluation (Signed)
 Physical Therapy Evaluation Patient Details Name: [name] MRN: [id number] DOB: 1956/01/23 Today's Date: 07/11/2024  History of Present Illness  Pt is a 69 y/o F admitted on 07/10/24 after presenting with c/o acute onset of LLE numbness & inability to bear weight. PT with recent L4-5 discectomy on 04/25/24. Symptoms are consistent with L5 motor radiculopathy. PMH: anxiety, depression, asthma, benign essential tremor, CAD, fibromyalgia, HTN, dyslipidemia, hypothyroidism, migraine, Parkinson's disease, GERD, DM2  Clinical Impression  Pt seen for PT evaluation with pt agreeable. Pt reports prior to her back surgery she was independent without AD but since surgery she was ambulatory with rollator, receiving HHPT, notes 3 falls in the past 6 months. Pt with impaired strength in RLE, pain in L knee that greatly limits her mobility. Pt requires mod assist for bed mobility even with hospital bed features, transfers to recliner with RW & min assist, limited by pain. Pt would benefit from ongoing PT services to progress mobility & increase independence as able. At this time, recommend post acute rehab >3 hours therapy/day upon d/c.        If plan is discharge home, recommend the following: A lot of help with walking and/or transfers;A lot of help with bathing/dressing/bathroom;Assist for transportation;Assistance with cooking/housework;Direct supervision/assist for financial management;Help with stairs or ramp for entrance   Can travel by private vehicle        Equipment Recommendations Rolling walker (2 wheels)  Recommendations for Other Services       Functional Status Assessment Patient has had a recent decline in their functional status and demonstrates the ability to make significant improvements in function in a reasonable and predictable amount of time.     Precautions / Restrictions Precautions Precautions: Fall Precaution/Restrictions Comments: R foot drop Restrictions Weight Bearing  Restrictions Per Provider Order: No      Mobility  Bed Mobility Overal bed mobility: Needs Assistance Bed Mobility: Supine to Sit     Supine to sit: Mod assist, HOB elevated, Used rails     General bed mobility comments: assistance to move BLE off EOB, assistance to upright trunk, exit R side of bed    Transfers Overall transfer level: Needs assistance Equipment used: Rolling walker (2 wheels) Transfers: Sit to/from Stand, Bed to chair/wheelchair/BSC Sit to Stand: Min assist   Step pivot transfers: Min assist (Pt able to take steps to R to recliner with RW)       General transfer comment: sit>stand from EOB    Ambulation/Gait                  Stairs            Wheelchair Mobility     Tilt Bed    Modified Rankin (Stroke Patients Only)       Balance Overall balance assessment: Needs assistance Sitting-balance support: Feet supported Sitting balance-Leahy Scale: Fair     Standing balance support: During functional activity, Bilateral upper extremity supported, Reliant on assistive device for balance Standing balance-Leahy Scale: Fair                               Pertinent Vitals/Pain Pain Assessment Pain Assessment: 0-10 Pain Score: 7  Pain Location: anterior L knee Pain Descriptors / Indicators: Aching Pain Intervention(s): Monitored during session, Limited activity within patient's tolerance, Premedicated before session, Repositioned    Home Living Family/patient expects to be discharged to:: Private residence Living Arrangements: Spouse/significant other;Children (76 y/o  son with special needs, neither of them can really physically assist pt) Available Help at Discharge: Family;Available PRN/intermittently Type of Home: House Home Access: Stairs to enter;Ramped entrance (ramp at back entrance)   Entrance Stairs-Number of Steps: 6   Home Layout: One level Home Equipment: Rollator (4 wheels);BSC/3in1;Wheelchair - manual       Prior Function               Mobility Comments: Prior to surgery in October pt was independent without AD, since sx pt was ambulating with rollator, receiving HHPT services, notes 3 falls in the past 6 months. ADLs Comments: Reports no assistance.     Extremity/Trunk Assessment   Upper Extremity Assessment Upper Extremity Assessment: Overall WFL for tasks assessed    Lower Extremity Assessment Lower Extremity Assessment: RLE deficits/detail (BLE sensation to light touch & proprioception intact) RLE Deficits / Details: R ankle dorsiflexion 0/5, able to twich toes but no anterior tib activation noted       Communication   Communication Communication: No apparent difficulties    Cognition Arousal: Alert Behavior During Therapy: WFL for tasks assessed/performed, Anxious   PT - Cognitive impairments: No apparent impairments                         Following commands: Intact       Cueing Cueing Techniques: Verbal cues     General Comments      Exercises     Assessment/Plan    PT Assessment Patient needs continued PT services  PT Problem List Decreased strength;Decreased range of motion;Decreased activity tolerance;Decreased balance;Decreased mobility;Decreased knowledge of precautions;Decreased safety awareness;Decreased knowledge of use of DME;Decreased coordination;Pain       PT Treatment Interventions DME instruction;Therapeutic exercise;Gait training;Balance training;Neuromuscular re-education;Stair training;Functional mobility training;Therapeutic activities;Patient/family education    PT Goals (Current goals can be found in the Care Plan section)  Acute Rehab PT Goals Patient Stated Goal: decreased pain PT Goal Formulation: With patient Time For Goal Achievement: 07/25/24 Potential to Achieve Goals: Good    Frequency Min 3X/week     Co-evaluation               AM-PAC PT 6 Clicks Mobility  Outcome Measure Help needed turning  from your back to your side while in a flat bed without using bedrails?: A Little Help needed moving from lying on your back to sitting on the side of a flat bed without using bedrails?: Total Help needed moving to and from a bed to a chair (including a wheelchair)?: A Little Help needed standing up from a chair using your arms (e.g., wheelchair or bedside chair)?: A Little Help needed to walk in hospital room?: A Lot Help needed climbing 3-5 steps with a railing? : Total 6 Click Score: 13    End of Session   Activity Tolerance: Patient limited by pain Patient left: in chair;with chair alarm set;with call bell/phone within reach Nurse Communication: Mobility status PT Visit Diagnosis: Pain;Muscle weakness (generalized) (M62.81);Difficulty in walking, not elsewhere classified (R26.2);Other abnormalities of gait and mobility (R26.89);Other symptoms and signs involving the nervous system (R29.898) Pain - Right/Left: Left Pain - part of body: Knee    Time: 8942-8886 PT Time Calculation (min) (ACUTE ONLY): 16 min   Charges:   PT Evaluation $PT Eval Low Complexity: 1 Low   PT General Charges $$ ACUTE PT VISIT: 1 Visit         Richerd Pinal, PT, DPT 07/11/2024, 11:21 AM  [name] 07/11/2024, 11:20 AM

## 2024-07-11 NOTE — Assessment & Plan Note (Addendum)
-   Will replace potassium and check magnesium level.

## 2024-07-11 NOTE — Progress Notes (Signed)
 [facility] Progress Note  Patient: [name]    FMW:[id number]  DOA: 07/10/2024     Date of Service: the patient was seen and examined on 07/11/2024  Chief Complaint  Patient presents with   Numbness   Brief hospital course: [name] is a 69 y.o. female with medical history significant for anxiety, depression, asthma, benign essential tremor, coronary artery disease, fibromyalgia, hypertension, dyslipidemia, hypothyroidism, migraine, Parkinson's disease, GERD and type 2 diabetes mellitus, who presented to the emergency room with acute onset of left leg numbness with inability to bear weight on.  The patient had a recent L4-L5 discectomy and was seen today by Dr. Katrina who sent her to the emergency room for potential need for surgical intervention in a.m. the patient denied any other paresthesias or focal muscle weakness.  No urinary or stool incontinence or pelvic tingling or numbness.  No fever or chills.  No chest pain or palpitations.  No nausea or vomiting or abdominal pain.  No dysuria, oliguria or hematuria or flank pain.   ED Course: When she came to the ER, BP was 174/61 with otherwise normal vital signs.  Labs revealed hypokalemia of 2.9 with blood glucose of 155 and calcium 8.7.  CBC showed leukocytosis 12.7 with neutrophilia. EKG as reviewed by me : None. Imaging: Lumbar spine MRI showed the following: 1. Postoperative changes from prior right hemi laminectomy with micro discectomy at L4-5. Previously seen postoperative collection at the laminectomy defect has resolved in the interim. Residual mild to moderate narrowing of the lateral recesses at this level, improved as compared to prior MRI. 2. Degenerative reactive endplate change about the L4-5 interspace, extending to involve the bilateral L4-5 facets. Finding could contribute to back pain. 3. Mild disc bulging with facet hypertrophy at L3-4 with resultant mild narrowing of the lateral recesses.   Thoracic spine  MRI showed the following: 1. Small disc protrusions at T5-6 and T11-12 as above without significant stenosis or overt neural impingement. 2. Mild dextroscoliosis with intermittent mild facet degeneration within the thoracic spine. Finding could contribute to back pain. 3. No other acute abnormality.   The patient was given 40 mcg p.o. potassium chloride  and I added another dose and 1 p.o. Norco.  She will be admitted to an observation medical-surgical bed for further evaluation and management.    Assessment and Plan:  # Numbness and tingling of left lower extremity - This is likely radiculopathy due to lumbar disc disease. - Will hold off Eliquis . - Neurosurgery consulted, recommended neurology evaluation and EMG as an outpatient.  MRI lumbar spine looks better, no worsening of disc herniation so may not benefit from surgical intervention Follow neurosurgery for further recommendation Follow PT and OT eval    # Hypokalemia: Potassium repleted # Hypomagnesemia, mag repleted. Check electrolytes daily and replete as needed.    Type 2 diabetes mellitus with peripheral neuropathy (HCC) - The patient be placed on supplemental coverage with NovoLog . - Will continue basal coverage. - Will continue Neurontin .   Chronic heart failure with preserved ejection fraction (HFpEF) (HCC) - Will continue Farxiga and Aldactone .   GERD without esophagitis - Will continue PPI therapy.   Anxiety - Will continue Ativan .   Essential hypertension - Will continue antihypertensive therapy.   Depression - Will continue Wellbutrin  XL and Celexa .   Parkinson's disease (HCC) - Continue Sinemet  IR.  # H/o DVT on Eliquis  Last dose Eliquis  on 1/13 AM  # Left knee osteoarthritis X-ray negative for any acute  finding Continue PT and OT and pain control   Body mass index is 41.17 kg/m.  Interventions:  Diet: Regular diet DVT Prophylaxis: SCDs  Advance goals of care discussion: Full  code  Family Communication: family was not present at bedside, at the time of interview.  The pt provided permission to discuss medical plan with the family. Opportunity was given to ask question and all questions were answered satisfactorily.   Disposition:  Pt is from home, admitted with LLE weakness and numbness, left knee pain and right foot drop, still has LLE weakness, which precludes a safe discharge. Discharge to home with home PT versus SNF, when stable and cleared by neurosurgery, may need few days to improve.  Subjective: No significant events overnight.  Patient is still complaining of left lower extremity weakness and numbness, complaining of left knee pain and patient was so has right foot drop.  No any other complaints.  Physical Exam: General: NAD, lying comfortably Appear in no distress, affect appropriate Eyes: PERRLA ENT: Oral Mucosa Clear, moist  Neck: no JVD,  Cardiovascular: S1 and S2 Present, no Murmur,  Respiratory: good respiratory effort, Bilateral Air entry equal and Decreased, no Crackles, no wheezes Abdomen: Bowel Sound present, Soft and no tenderness,  Skin: no rashes Extremities: Right foot drop, no Pedal edema, no calf tenderness Neurologic: without any new focal findings Gait not checked due to patient safety concerns  Vitals:   07/10/24 2345 07/11/24 0203 07/11/24 0424 07/11/24 0746  BP: 116/60  (!) 99/45 (!) 145/65  Pulse: 86  87 84  Resp: 18  18 16   Temp: 98.7 F (37.1 C)  98 F (36.7 C) 97.9 F (36.6 C)  TempSrc:      SpO2: 94%  93% 95%  Weight:  108.8 kg    Height:  5' 4 (1.626 m)      Intake/Output Summary (Last 24 hours) at 07/11/2024 1559 Last data filed at 07/11/2024 1300 Gross per 24 hour  Intake 0 ml  Output --  Net 0 ml   Filed Weights   07/10/24 1545 07/11/24 0203  Weight: 108.8 kg 108.8 kg    Data Reviewed: I have personally reviewed and interpreted daily labs, tele strips, imagings as discussed above. I reviewed all  nursing notes, pharmacy notes, vitals, pertinent old records I have discussed plan of care as described above with RN and patient/family.  CBC: Recent Labs  Lab 07/05/24 1632 07/10/24 2048 07/11/24 0427  WBC 12.9* 12.7* 11.9*  NEUTROABS  --  8.0*  --   HGB 12.6 12.4 12.2  HCT 40.8 40.1 39.6  MCV 88.1 87.4 87.8  PLT 307 267 265   Basic Metabolic Panel: Recent Labs  Lab 07/05/24 1632 07/10/24 2048 07/11/24 0427  NA 138 140 141  K 3.7 2.9* 3.1*  CL 99 102 103  CO2 25 27 28   GLUCOSE 111* 155* 113*  BUN 9 9 8   CREATININE 0.82 0.61 0.58  CALCIUM 9.5 8.7* 8.3*  MG  --   --  1.6*  PHOS  --   --  3.5    Studies: DG Knee 1-2 Views Left Result Date: 07/11/2024 CLINICAL DATA:  Ongoing left knee pain, worse with movement. Knee replacement in 2023. EXAM: DG KNEE 1-2V*L* COMPARISON:  Radiograph 01/04/2022 FINDINGS: Left knee arthroplasty in expected alignment. No periprosthetic lucency. No acute or periprosthetic fracture. Prior patellar resurfacing. No joint effusion. No erosive change or focal bone abnormality. Unremarkable soft tissues. IMPRESSION: Left knee arthroplasty without complication or acute  abnormality. Electronically Signed   By: Andrea Gasman M.D.   On: 07/11/2024 13:37   MR LUMBAR SPINE WO CONTRAST Result Date: 07/10/2024 CLINICAL DATA:  Initial evaluation for acute back pain. EXAM: MRI LUMBAR SPINE WITHOUT CONTRAST TECHNIQUE: Multiplanar, multisequence MR imaging of the lumbar spine was performed. No intravenous contrast was administered. COMPARISON:  Prior MRI from 05/23/2024. FINDINGS: Segmentation: Standard. Lowest well-formed disc space labeled the L5-S1 level. Alignment: Straightening of the normal lumbar lordosis. No significant listhesis. Vertebrae: Vertebral body height maintained without acute or chronic fracture. Bone marrow signal intensity within normal limits. No worrisome osseous lesions. Degenerative reactive endplate change with marrow edema present about  the L4-5 interspace, worse on the right, mildly progressed from prior. Reactive marrow edema present about the L4-5 facets bilaterally due to facet arthritis. Conus medullaris and cauda equina: Conus extends to the T12-L1 level. Conus and cauda equina appear normal. Paraspinal and other soft tissues: Sequelae of prior right hemi laminectomy with micro discectomy at L4-5. Normal expected postoperative changes within the posterior paraspinous soft tissues. Previously seen collection at the laminectomy defect is largely resolved. Few small T2 hyperintense cyst noted about the kidneys, benign in appearance, no follow-up imaging recommended. Disc levels: L1-2: Mild endplate spurring without significant disc bulge. No stenosis. L2-3: Degenerative disc space narrowing with disc desiccation diffuse disc bulge. Reactive endplate spurring, greater on the right. No spinal stenosis. Foramina remain patent. L3-4: Disc desiccation with mild disc bulge. Endplate spurring. Mild bilateral facet hypertrophy. Resultant mild narrowing of the lateral recesses, slightly greater on the right. Central canal remains patent. No significant foraminal stenosis. L4-5: Degenerative vertebral disc space narrowing with disc desiccation diffuse disc bulge. Disc bulging asymmetric to the right, with right greater than left reactive endplate spurring. Postoperative changes from prior right hemi laminectomy with micro discectomy. Probable postoperative granulation tissue within the right lateral epidural space. Evaluation for residual recurrent disc protrusion limited on this noncontrast examination. Mild to moderate bilateral facet arthrosis. Residual mild to moderate narrowing of the lateral recesses at this level. Central canal remains patent. Stenosis is at this level is improved as compared to prior MRI. Mild right L4 foraminal stenosis. Left neural foramina remains patent. L5-S1: Disc desiccation with mild disc bulge. Reactive endplate spurring.  Mild bilateral facet hypertrophy. No spinal stenosis. Mild left L5 foraminal narrowing. Right neural foramina remains patent. IMPRESSION: 1. Postoperative changes from prior right hemi laminectomy with micro discectomy at L4-5. Previously seen postoperative collection at the laminectomy defect has resolved in the interim. Residual mild to moderate narrowing of the lateral recesses at this level, improved as compared to prior MRI. 2. Degenerative reactive endplate change about the L4-5 interspace, extending to involve the bilateral L4-5 facets. Finding could contribute to back pain. 3. Mild disc bulging with facet hypertrophy at L3-4 with resultant mild narrowing of the lateral recesses. Electronically Signed   By: Morene Hoard M.D.   On: 07/10/2024 23:47   MR THORACIC SPINE WO CONTRAST Result Date: 07/10/2024 CLINICAL DATA:  Initial evaluation for acute mid back pain. EXAM: MRI THORACIC SPINE WITHOUT CONTRAST TECHNIQUE: Multiplanar, multisequence MR imaging of the thoracic spine was performed. No intravenous contrast was administered. COMPARISON:  Prior MRI from 05/23/2024. FINDINGS: Alignment: Mild dextroscoliosis. Alignment otherwise normal with preservation of the normal cervical lordosis. No listhesis. Vertebrae: Vertebral body height maintained without acute or chronic fracture. Bone marrow signal intensity within normal limits. Few small benign hemangiomata noted. No worrisome osseous lesions. No abnormal marrow edema. Cord:  Normal  signal and morphology. Paraspinal and other soft tissues: Paraspinous soft tissues within normal limits. Small T2 hyperintense right renal cysts noted, benign in appearance, no follow-up imaging recommended. Disc levels: Postoperative changes noted within the cervical spine on counter sequence. T5-6: Small right paracentral disc protrusion minimally flattens the right ventral thecal sac without stenosis or impingement. T11-12: Small left paracentral disc protrusion with  endplate spurring mildly indents the ventral thecal sac. No spinal stenosis. Foramina remain patent. Otherwise, no other significant disc pathology seen elsewhere within the thoracic spine for age. No other significant disc bulge or focal disc herniation. Mild intermittent facet degeneration noted. Mild diffuse prominence of the dorsal epidural fat. No significant spinal stenosis. Foramina remain patent. Overall, appearance of the thoracic spine is not significantly changed from prior. IMPRESSION: 1. Small disc protrusions at T5-6 and T11-12 as above without significant stenosis or overt neural impingement. 2. Mild dextroscoliosis with intermittent mild facet degeneration within the thoracic spine. Finding could contribute to back pain. 3. No other acute abnormality. Electronically Signed   By: Morene Hoard M.D.   On: 07/10/2024 23:37    Scheduled Meds:  [START ON 07/12/2024] buPROPion   150 mg Oral q AM   carbidopa -levodopa   2 tablet Oral TID   citalopram   20 mg Oral Daily   gabapentin   300 mg Oral Daily   And   gabapentin   600 mg Oral Daily   And   gabapentin   600 mg Oral QHS   irbesartan   37.5 mg Oral Daily   potassium chloride  SA  40 mEq Oral Q4H   spironolactone   25 mg Oral q AM   [START ON 07/12/2024] Vitamin D  (Ergocalciferol )  50,000 Units Oral Weekly   Continuous Infusions:  sodium chloride  100 mL/hr at 07/11/24 0526   PRN Meds: acetaminophen  **OR** acetaminophen , magnesium  hydroxide, metoCLOPramide , morphine  injection, ondansetron  **OR** ondansetron  (ZOFRAN ) IV, zolpidem   Time spent: 55 minutes  Author: ELVAN SOR. MD [facility] 07/11/2024 3:59 PM  To reach On-call, see care teams to locate the attending and reach out to them via [email or web address]. If 7PM-7AM, please contact night-coverage If you still have difficulty reaching the attending provider, please page the [facility] (Director on Call) for [facility] on amion for assistance.

## 2024-07-12 DIAGNOSIS — R200 Anesthesia of skin: Secondary | ICD-10-CM | POA: Diagnosis not present

## 2024-07-12 DIAGNOSIS — R202 Paresthesia of skin: Secondary | ICD-10-CM | POA: Diagnosis not present

## 2024-07-12 LAB — CBC
HCT: 38.1 % (ref 36.0–46.0)
Hemoglobin: 11.6 g/dL — ABNORMAL LOW (ref 12.0–15.0)
MCH: 27 pg (ref 26.0–34.0)
MCHC: 30.4 g/dL (ref 30.0–36.0)
MCV: 88.6 fL (ref 80.0–100.0)
Platelets: 246 K/uL (ref 150–400)
RBC: 4.3 MIL/uL (ref 3.87–5.11)
RDW: 14.1 % (ref 11.5–15.5)
WBC: 9.8 K/uL (ref 4.0–10.5)
nRBC: 0 % (ref 0.0–0.2)

## 2024-07-12 LAB — BASIC METABOLIC PANEL WITH GFR
Anion gap: 10 (ref 5–15)
BUN: 5 mg/dL — ABNORMAL LOW (ref 8–23)
CO2: 27 mmol/L (ref 22–32)
Calcium: 8.6 mg/dL — ABNORMAL LOW (ref 8.9–10.3)
Chloride: 103 mmol/L (ref 98–111)
Creatinine, Ser: 0.54 mg/dL (ref 0.44–1.00)
GFR, Estimated: >60 mL/min
Glucose, Bld: 86 mg/dL (ref 70–99)
Potassium: 3.6 mmol/L (ref 3.5–5.1)
Sodium: 140 mmol/L (ref 135–145)

## 2024-07-12 LAB — MAGNESIUM: Magnesium: 2.1 mg/dL (ref 1.7–2.4)

## 2024-07-12 LAB — PHOSPHORUS: Phosphorus: 2.9 mg/dL (ref 2.5–4.6)

## 2024-07-12 MED ORDER — HYDRALAZINE HCL 50 MG PO TABS
50.0000 mg | ORAL_TABLET | Freq: Four times a day (QID) | ORAL | Status: DC | PRN
  Administered 2024-07-13: 50 mg via ORAL
  Filled 2024-07-12: qty 1

## 2024-07-12 MED ORDER — HYDRALAZINE HCL 20 MG/ML IJ SOLN: 10.0000 mg | Freq: Four times a day (QID) | INTRAMUSCULAR | Status: DC | PRN

## 2024-07-12 MED ORDER — POTASSIUM CHLORIDE CRYS ER 20 MEQ PO TBCR
20.0000 meq | EXTENDED_RELEASE_TABLET | Freq: Once | ORAL | Status: AC
  Administered 2024-07-12: 20 meq via ORAL
  Filled 2024-07-12: qty 1

## 2024-07-12 MED ORDER — APIXABAN 5 MG PO TABS
5.0000 mg | ORAL_TABLET | Freq: Two times a day (BID) | ORAL | Status: DC
  Administered 2024-07-12 – 2024-07-13 (×3): 5 mg via ORAL
  Filled 2024-07-12 (×3): qty 1

## 2024-07-12 NOTE — Progress Notes (Signed)
 [facility] Progress Note  Patient: [name]    FMW:[id number]  DOA: 07/10/2024     Date of Service: the patient was seen and examined on 07/12/2024  Chief Complaint  Patient presents with   Numbness   Brief hospital course: [name] is a 69 y.o. female with medical history significant for anxiety, depression, asthma, benign essential tremor, coronary artery disease, fibromyalgia, hypertension, dyslipidemia, hypothyroidism, migraine, Parkinson's disease, GERD and type 2 diabetes mellitus, who presented to the emergency room with acute onset of left leg numbness with inability to bear weight on.  The patient had a recent L4-L5 discectomy and was seen today by Dr. Katrina who sent her to the emergency room for potential need for surgical intervention in a.m. the patient denied any other paresthesias or focal muscle weakness.  No urinary or stool incontinence or pelvic tingling or numbness.  No fever or chills.  No chest pain or palpitations.  No nausea or vomiting or abdominal pain.  No dysuria, oliguria or hematuria or flank pain.   ED Course: When she came to the ER, BP was 174/61 with otherwise normal vital signs.  Labs revealed hypokalemia of 2.9 with blood glucose of 155 and calcium 8.7.  CBC showed leukocytosis 12.7 with neutrophilia. EKG as reviewed by me : None. Imaging: Lumbar spine MRI showed the following: 1. Postoperative changes from prior right hemi laminectomy with micro discectomy at L4-5. Previously seen postoperative collection at the laminectomy defect has resolved in the interim. Residual mild to moderate narrowing of the lateral recesses at this level, improved as compared to prior MRI. 2. Degenerative reactive endplate change about the L4-5 interspace, extending to involve the bilateral L4-5 facets. Finding could contribute to back pain. 3. Mild disc bulging with facet hypertrophy at L3-4 with resultant mild narrowing of the lateral recesses.   Thoracic spine  MRI showed the following: 1. Small disc protrusions at T5-6 and T11-12 as above without significant stenosis or overt neural impingement. 2. Mild dextroscoliosis with intermittent mild facet degeneration within the thoracic spine. Finding could contribute to back pain. 3. No other acute abnormality.   The patient was given 40 mcg p.o. potassium chloride  and I added another dose and 1 p.o. Norco.  She will be admitted to an observation medical-surgical bed for further evaluation and management.    Assessment and Plan:  # Numbness and tingling of left lower extremity - This is likely radiculopathy due to lumbar disc disease. - 1/15 resumed Eliquis , no surgical intervention as per neurosurgery - Neurosurgery consulted, recommended neurology evaluation and EMG as an outpatient.  MRI lumbar spine looks better, no worsening of disc herniation so may not benefit from surgical intervention Neurosurgery recommended physical therapy no surgical intervention at this time  PT and OT eval recommended SNF placement versus acute rehab but patient declined. Recommended left knee brace and right foot AFO as an outpatient   # Hypokalemia: Potassium repleted # Hypomagnesemia, mag repleted. Check electrolytes daily and replete as needed.    Type 2 diabetes mellitus with peripheral neuropathy (HCC) - The patient be placed on supplemental coverage with NovoLog . - Will continue basal coverage. - Will continue Neurontin .   Chronic heart failure with preserved ejection fraction (HFpEF) (HCC) - Will continue Farxiga and Aldactone .   GERD without esophagitis - Will continue PPI therapy.   Anxiety - Will continue Ativan .   Essential hypertension - Will continue antihypertensive therapy.   Depression - Will continue Wellbutrin  XL and Celexa .  Parkinson's disease (HCC) - Continue Sinemet  IR.  # H/o DVT on Eliquis  Last dose Eliquis  on 1/13 AM 1/15 resume Eliquis  5 mg p.o. twice daily, no surgical  intervention  # Left knee osteoarthritis X-ray negative for any acute finding Continue PT and OT and pain control   Body mass index is 41.17 kg/m.  Interventions:  Diet: Regular diet DVT Prophylaxis: Eliquis   Advance goals of care discussion: Full code  Family Communication: family was not present at bedside, at the time of interview.  The pt provided permission to discuss medical plan with the family. Opportunity was given to ask question and all questions were answered satisfactorily.   Disposition:  Pt is from home, admitted with LLE weakness and numbness, left knee pain and right foot drop, still has LLE weakness, which precludes a safe discharge. Discharge to home with home PT vs SNF/CIR, if patient agrees otherwise plan is to discharge her home tomorrow a.m.   Subjective: No significant events overnight.  Patient was sitting on the recliner, did work with physical therapy.  Still having difficulty walking due to right foot drop and left knee pain. Patient was encouraged to go to SNF, versus plan is to discharge her home tomorrow a.m.   Physical Exam: General: NAD, lying comfortably Appear in no distress, affect appropriate Eyes: PERRLA ENT: Oral Mucosa Clear, moist  Neck: no JVD,  Cardiovascular: S1 and S2 Present, no Murmur,  Respiratory: good respiratory effort, Bilateral Air entry equal and Decreased, no Crackles, no wheezes Abdomen: Bowel Sound present, Soft and no tenderness,  Skin: no rashes Extremities: Right foot drop, no Pedal edema, no calf tenderness Neurologic: without any new focal findings Gait not checked due to patient safety concerns  Vitals:   07/11/24 1609 07/11/24 1931 07/12/24 0355 07/12/24 0803  BP: (!) 159/71 (!) 145/78 (!) 150/65 (!) 173/88  Pulse: 75 85 80 74  Resp: 20 17 15 16   Temp: 97.9 F (36.6 C) 98.4 F (36.9 C) 98.2 F (36.8 C) 98.2 F (36.8 C)  TempSrc:  Oral    SpO2: 94% 98% 97% 97%  Weight:      Height:         Intake/Output Summary (Last 24 hours) at 07/12/2024 1512 Last data filed at 07/12/2024 0900 Gross per 24 hour  Intake 2279.11 ml  Output 550 ml  Net 1729.11 ml   Filed Weights   07/10/24 1545 07/11/24 0203  Weight: 108.8 kg 108.8 kg    Data Reviewed: I have personally reviewed and interpreted daily labs, tele strips, imagings as discussed above. I reviewed all nursing notes, pharmacy notes, vitals, pertinent old records I have discussed plan of care as described above with RN and patient/family.  CBC: Recent Labs  Lab 07/05/24 1632 07/10/24 2048 07/11/24 0427 07/12/24 0459  WBC 12.9* 12.7* 11.9* 9.8  NEUTROABS  --  8.0*  --   --   HGB 12.6 12.4 12.2 11.6*  HCT 40.8 40.1 39.6 38.1  MCV 88.1 87.4 87.8 88.6  PLT 307 267 265 246   Basic Metabolic Panel: Recent Labs  Lab 07/05/24 1632 07/10/24 2048 07/11/24 0427 07/12/24 0459  NA 138 140 141 140  K 3.7 2.9* 3.1* 3.6  CL 99 102 103 103  CO2 25 27 28 27   GLUCOSE 111* 155* 113* 86  BUN 9 9 8  5*  CREATININE 0.82 0.61 0.58 0.54  CALCIUM 9.5 8.7* 8.3* 8.6*  MG  --   --  1.6* 2.1  PHOS  --   --  3.5 2.9    Studies: No results found.   Scheduled Meds:  apixaban   5 mg Oral BID   buPROPion   150 mg Oral q AM   carbidopa -levodopa   2 tablet Oral TID   citalopram   20 mg Oral Daily   gabapentin   300 mg Oral Daily   And   gabapentin   600 mg Oral Daily   And   gabapentin   600 mg Oral QHS   irbesartan   37.5 mg Oral Daily   spironolactone   25 mg Oral q AM   Vitamin D  (Ergocalciferol )  50,000 Units Oral Weekly   Continuous Infusions:   PRN Meds: acetaminophen  **OR** acetaminophen , hydrALAZINE  **OR** hydrALAZINE , magnesium  hydroxide, metoCLOPramide , morphine  injection, ondansetron  **OR** ondansetron  (ZOFRAN ) IV, zolpidem   Time spent: 40 minutes  Author: ELVAN SOR. MD [facility] 07/12/2024 3:12 PM  To reach On-call, see care teams to locate the attending and reach out to them via [email or web address]. If  7PM-7AM, please contact night-coverage If you still have difficulty reaching the attending provider, please page the [facility] (Director on Call) for [facility] on amion for assistance.

## 2024-07-12 NOTE — Progress Notes (Signed)
 Neurosurgery Progress Note  History: [name] is here for functional decline  HD2: No change.  Saw neurology yesterday.  Physical Exam: Vitals:   07/12/24 0355 07/12/24 0803  BP: (!) 150/65 (!) 173/88  Pulse: 80 74  Resp: 15 16  Temp: 98.2 F (36.8 C) 98.2 F (36.8 C)  SpO2: 97% 97%    AA Ox3 CNI  Strength: Dense R foot drop  Data:  Other tests/results: n/a  Assessment/Plan:  [name] is stable from yesterday.  Her mobility is significantly limited.   I have discussed her case with Dr. Michiel as well as the patient at length.  We reviewed her imaging, which is improved.  At this point, do not feel that the exploration is indicated.  Will focus on therapy.  - mobilize - pain control - DVT prophylaxis ok to resume - PTOT - Consider left knee brace and right AFO - Consider EMGNCV   Reeves Daisy MD, [facility] Department of Neurosurgery

## 2024-07-12 NOTE — TOC Progression Note (Signed)
 Transition of Care [facility]) - Progression Note    Patient Details  Name: [name] MRN: [id number] Date of Birth: 1956-01-04  [facility]) CM/SW Contact  Grayce JAYSON Perfect, RN Phone Number: 07/12/2024, 12:34 PM  Clinical Narrative:      RNCM met with patient in her room.  She is alert and oriented.  RN attempted to discuss PT/OT recommendations for acute rehab facility and she immediately states she is going home, not going to a facility and does not want to talk about it.  RNCM inquired if she could take care of herself at home, she has husband who just had a AKA and a handicapped son of which offer minimal assist.  Patient requiring significant assist with all mobility/ADL's. RNCM will follow up with hospital team for a discharge plan when she is medically stable.                    Expected Discharge Plan and Services                                               Social Drivers of Health (SDOH) Interventions SDOH Screenings   Food Insecurity: Patient Declined (07/11/2024)  Housing: Patient Declined (07/11/2024)  Transportation Needs: Patient Declined (07/11/2024)  Utilities: Patient Declined (07/11/2024)  Depression (PHQ2-9): Low Risk (01/04/2024)  Financial Resource Strain: Low Risk  (03/27/2024)   Received from [facility]  Social Connections: Patient Declined (07/11/2024)  Recent Concern: Social Connections - Moderately Isolated (06/26/2024)  Tobacco Use: Medium Risk (07/10/2024)    Readmission Risk Interventions     No data to display

## 2024-07-12 NOTE — Care Management Obs Status (Signed)
 MEDICARE OBSERVATION STATUS NOTIFICATION   Patient Details  Name: UNDRA TREMBATH MRN: [id number] Date of Birth: July 27, 1955   Medicare Observation Status Notification Given:  Yes    Aliya Sol W, CMA 07/12/2024, 11:50 AM

## 2024-07-12 NOTE — Plan of Care (Signed)

## 2024-07-12 NOTE — TOC Progression Note (Signed)
 Transition of Care [facility]) - Progression Note    Patient Details  Name: [name] MRN: [id number] Date of Birth: 12/15/1955  Transition of Care [facility]) CM/SW Contact  Dalia GORMAN Fuse, RN Phone Number: 07/12/2024, 2:28 PM  Clinical Narrative:     PASRR [id number] A obtained                    Expected Discharge Plan and Services                                               Social Drivers of Health (SDOH) Interventions SDOH Screenings   Food Insecurity: Patient Declined (07/11/2024)  Housing: Patient Declined (07/11/2024)  Transportation Needs: Patient Declined (07/11/2024)  Utilities: Patient Declined (07/11/2024)  Depression (PHQ2-9): Low Risk (01/04/2024)  Financial Resource Strain: Low Risk  (03/27/2024)   Received from [facility]  Social Connections: Patient Declined (07/11/2024)  Recent Concern: Social Connections - Moderately Isolated (06/26/2024)  Tobacco Use: Medium Risk (07/10/2024)    Readmission Risk Interventions     No data to display

## 2024-07-12 NOTE — NC FL2 (Addendum)
 " [place]  MEDICAID FL2 LEVEL OF CARE FORM     IDENTIFICATION  Patient Name: [name] Birthdate: 1955/12/28 Sex: female Admission Date (Current Location): 07/10/2024  [facility] and [place] Number:  Chiropodist and Address:  [facility], [address], [place], [place] [address]      Provider Number: [id number]  Attending Physician Name and Address:  Von Bellis, MD  Relative Name and Phone Number:  Freya, Zobrist, Emergency Contact  [phone] (Mobile)    Current Level of Care: Hospital Recommended Level of Care: Skilled Nursing Facility Prior Approval Number:    Date Approved/Denied:   PASRR Number:    Discharge Plan: SNF    Current Diagnoses: Patient Active Problem List   Diagnosis Date Noted   Parkinson's disease (HCC) 07/11/2024   Depression 07/11/2024   Essential hypertension 07/11/2024   Anxiety 07/11/2024   GERD without esophagitis 07/11/2024   Chronic heart failure with preserved ejection fraction (HFpEF) (HCC) 07/11/2024   Type 2 diabetes mellitus with peripheral neuropathy (HCC) 07/11/2024   Hypokalemia 07/11/2024   Right foot drop 07/11/2024   Numbness and tingling of left lower extremity 07/10/2024   Gastroenteritis 06/26/2024   Intractable nausea and vomiting 06/26/2024   Abnormal sensation of lower extremity 05/23/2024   S/P lumbar laminectomy 05/23/2024   Weakness of right lower extremity 05/23/2024   Right leg weakness 04/25/2024   Lumbar radiculopathy 04/25/2024   S/P TKR (total knee replacement) using cement, left 01/04/2022   PMB (postmenopausal bleeding) 11/01/2019   Mixed incontinence 11/01/2019   Anemia, unspecified 03/21/2017   Anxiety and depression 03/21/2017   Asthma without status asthmaticus 03/21/2017   Benign essential tremor 03/21/2017   Claustrophobia 03/21/2017   Fibromyalgia 03/21/2017   GERD (gastroesophageal reflux disease) 03/21/2017   Heart palpitations 03/21/2017   History  of gastric ulcer 03/21/2017   History of motion sickness 03/21/2017   Hyperlipidemia 03/21/2017   Hypertension 03/21/2017   Hypothyroid 03/21/2017   Leukocytosis 03/21/2017   Osteoarthritis 03/21/2017   PONV (postoperative nausea and vomiting) 03/21/2017   Seasonal allergies 03/21/2017   Snores 03/21/2017   SOB (shortness of breath) 01/19/2017   Lumbosacral spondylosis without myelopathy 01/11/2017   Cervical radiculopathy at C8 10/04/2016   Migraine without aura and without status migrainosus, not intractable 10/04/2016   Acute nonintractable headache 07/20/2016   Sepsis (HCC) 07/12/2016   Chronic bilateral low back pain with bilateral sciatica 04/27/2016   Sacroiliitis 04/27/2016   Radiculopathy of lumbar region 04/10/2016   History of fusion of cervical spine 02/23/2016   Radiculopathy of cervical region 02/23/2016   Bilateral thoracic back pain 05/07/2014   Knee joint replacement status 12/06/2013   Cervical stenosis of spinal canal 11/21/2012   Neck pain 10/04/2012    Orientation RESPIRATION BLADDER Height & Weight     Self, Time, Situation, Place  Normal External catheter Weight: 108.8 kg Height:  5' 4 (162.6 cm)  BEHAVIORAL SYMPTOMS/MOOD NEUROLOGICAL BOWEL NUTRITION STATUS      Continent Diet (regular)  AMBULATORY STATUS COMMUNICATION OF NEEDS Skin   Extensive Assist Verbally Skin abrasions (skin tear left arm)                       Personal Care Assistance Level of Assistance  Bathing, Feeding, Dressing Bathing Assistance: Limited assistance Feeding assistance: Independent Dressing Assistance: Limited assistance     Functional Limitations Info             SPECIAL CARE  FACTORS FREQUENCY  PT (By licensed PT), OT (By licensed OT)     PT Frequency: 5x week OT Frequency: 5x week            Contractures Contractures Info: Not present    Additional Factors Info  Code Status, Allergies Code Status Info: full Allergies Info: Cat Dander    ,    Oxycodone , Penicillin G  ,Statins,  Zosyn  (Piperacillin  Sod-tazobactam So)           Current Medications (07/12/2024):  This is the current hospital active medication list Current Facility-Administered Medications  Medication Dose Route Frequency Provider Last Rate Last Admin   acetaminophen  (TYLENOL ) tablet 650 mg  650 mg Oral Q6H PRN Mansy, Jan A, MD       Or   acetaminophen  (TYLENOL ) suppository 650 mg  650 mg Rectal Q6H PRN Mansy, Madison LABOR, MD       apixaban  (ELIQUIS ) tablet 5 mg  5 mg Oral BID Von Bellis, MD   5 mg at 07/12/24 1240   buPROPion  (WELLBUTRIN  XL) 24 hr tablet 150 mg  150 mg Oral q AM Mansy, Jan A, MD   150 mg at 07/12/24 0630   carbidopa -levodopa  (SINEMET  IR) 25-100 MG per tablet immediate release 2 tablet  2 tablet Oral TID Mansy, Jan A, MD   2 tablet at 07/12/24 1045   citalopram  (CELEXA ) tablet 20 mg  20 mg Oral Daily Mansy, Jan A, MD   20 mg at 07/12/24 1044   gabapentin  (NEURONTIN ) capsule 300 mg  300 mg Oral Daily Elesa Perkins, RPH   300 mg at 07/12/24 9097   And   gabapentin  (NEURONTIN ) capsule 600 mg  600 mg Oral Daily Elesa Perkins, RPH   600 mg at 07/12/24 1240   And   gabapentin  (NEURONTIN ) capsule 600 mg  600 mg Oral QHS Elesa Perkins, RPH   600 mg at 07/11/24 2026   hydrALAZINE  (APRESOLINE ) injection 10 mg  10 mg Intravenous Q6H PRN Von Bellis, MD       Or   hydrALAZINE  (APRESOLINE ) tablet 50 mg  50 mg Oral Q6H PRN Von Bellis, MD       irbesartan  (AVAPRO ) tablet 37.5 mg  37.5 mg Oral Daily Mansy, Jan A, MD   37.5 mg at 07/12/24 1046   magnesium  hydroxide (MILK OF MAGNESIA) suspension 30 mL  30 mL Oral Daily PRN Mansy, Jan A, MD       metoCLOPramide  (REGLAN ) tablet 10 mg  10 mg Oral Q8H PRN Mansy, Jan A, MD   10 mg at 07/12/24 1046   morphine  (PF) 2 MG/ML injection 2 mg  2 mg Intravenous Q4H PRN Mansy, Jan A, MD   2 mg at 07/11/24 1014   ondansetron  (ZOFRAN ) tablet 4 mg  4 mg Oral Q6H PRN Mansy, Jan A, MD       Or   ondansetron  (ZOFRAN )  injection 4 mg  4 mg Intravenous Q6H PRN Mansy, Jan A, MD       spironolactone  (ALDACTONE ) tablet 25 mg  25 mg Oral q AM Mansy, Jan A, MD   25 mg at 07/12/24 9374   Vitamin D  (Ergocalciferol ) (DRISDOL ) 1.25 MG (50000 UNIT) capsule 50,000 Units  50,000 Units Oral Weekly Mansy, Jan A, MD   50,000 Units at 07/12/24 1045   zolpidem  (AMBIEN ) tablet 5 mg  5 mg Oral QHS PRN Mansy, Jan A, MD   5 mg at 07/11/24 2211     Discharge Medications: Please see discharge  summary for a list of discharge medications.  Relevant Imaging Results:  Relevant Lab Results:   Additional Information  PASRR [id number] A obtained   Grayce JAYSON Perfect, RN     "

## 2024-07-13 ENCOUNTER — Telehealth: Payer: Self-pay

## 2024-07-13 ENCOUNTER — Other Ambulatory Visit: Payer: Self-pay | Admitting: Neurosurgery

## 2024-07-13 DIAGNOSIS — R200 Anesthesia of skin: Secondary | ICD-10-CM | POA: Diagnosis not present

## 2024-07-13 DIAGNOSIS — M21371 Foot drop, right foot: Secondary | ICD-10-CM

## 2024-07-13 DIAGNOSIS — R202 Paresthesia of skin: Secondary | ICD-10-CM | POA: Diagnosis not present

## 2024-07-13 LAB — GLUCOSE, CAPILLARY
Glucose-Capillary: 112 mg/dL — ABNORMAL HIGH (ref 70–99)
Glucose-Capillary: 164 mg/dL — ABNORMAL HIGH (ref 70–99)

## 2024-07-13 NOTE — Clinical Note (Incomplete)

## 2024-07-13 NOTE — TOC Transition Note (Signed)
 Transition of Care [facility]) - Discharge Note   Patient Details  Name: [name] MRN: [id number] Date of Birth: 11/14/1955  Transition of Care [facility]) CM/SW Contact:  Grayce JAYSON Perfect, RN Phone Number: 07/13/2024, 11:40 AM   Clinical Narrative:    Rn met with patient in her room.  She requested to continue homecare services with Physical Therapy and RN suggested CNA for personal care, she is in agreement and states that would help her a whole lot  RNCM notified Joane at [facility] and will continue to services and add C NA with order.  RN notified MD of need to order C NA and wheelchair per PT request/resumption of orders.    Patient has outpatient appointment for AFO for right foot/leg.  Patient contacting a family member to take her home today.  No issues obtaining her medications, she has family members who can drive her, she is not able to drive now, but did before this hospital admit.  No further TOC needs at this time.   Final next level of care: Home w Home Health Services Barriers to Discharge: No Barriers Identified   Patient Goals and CMS Choice Patient states their goals for this hospitalization and ongoing recovery are:: getting home, driving again, AFO for right foot/leg          Discharge Placement                  Name of family member notified: patient notified her husband Patient and family notified of of transfer: 07/13/24  Discharge Plan and Services Additional resources added to the After Visit Summary for                  DME Arranged: Wheelchair manual DME Agency: [employer] Date DME Agency Contacted: 07/13/24   Representative spoke with at DME Agency: [facility] HH Arranged: PT, Nurse's Aide HH Agency: [facility] Date [facility] Agency Contacted: 07/13/24   Representative spoke with at [facility] Agency: Joane  Social Drivers of Health (SDOH) Interventions SDOH Screenings   Food Insecurity: Patient Declined (07/11/2024)  Housing: Patient Declined  (07/11/2024)  Transportation Needs: Patient Declined (07/11/2024)  Utilities: Patient Declined (07/11/2024)  Depression (PHQ2-9): Low Risk (01/04/2024)  Financial Resource Strain: Low Risk  (03/27/2024)   Received from [facility]  Social Connections: Patient Declined (07/11/2024)  Recent Concern: Social Connections - Moderately Isolated (06/26/2024)  Tobacco Use: Medium Risk (07/10/2024)     Readmission Risk Interventions     No data to display

## 2024-07-13 NOTE — Discharge Summary (Signed)
 [facility] Discharge Summary   Patient: [name] FMW:[id number]  PCP: Valora Lynwood FALCON, MD  Date of admission: 07/10/2024   Date of discharge:  07/13/2024     Discharge Diagnoses:  Principal Problem:   Numbness and tingling of left lower extremity Active Problems:   Hypokalemia   Parkinson's disease (HCC)   Depression   Essential hypertension   Anxiety   GERD without esophagitis   Chronic heart failure with preserved ejection fraction (HFpEF) (HCC)   Type 2 diabetes mellitus with peripheral neuropathy (HCC)   Right foot drop   Admitted From: Home Disposition:  Home with [facility] PT/OT  Recommendations for Outpatient Follow-up:  F/u with PCP in 1 wk F/u with Neurology in 1 wk for EMG out patient F/u with Neurosurgery in 1 wk Follow up LABS/TEST:     Follow-up Information     Valora Lynwood FALCON, MD. Go on 07/18/2024.   Specialty: Family Medicine Why: @ 2pm with Tess NP  hospital follow up Contact information: [address] [facility] [place] [address] [phone]                Diet recommendation: Cardiac and Carb modified diet  Activity: The patient is advised to gradually reintroduce usual activities, as tolerated  Discharge Condition: stable  Code Status: Full code   History of present illness: As per the H and P dictated on admission.  Hospital Course:  [name] is a 69 y.o. female with medical history significant for anxiety, depression, asthma, benign essential tremor, coronary artery disease, fibromyalgia, hypertension, dyslipidemia, hypothyroidism, migraine, Parkinson's disease, GERD and type 2 diabetes mellitus, who presented to the emergency room with acute onset of left leg numbness with inability to bear weight on.  The patient had a recent L4-L5 discectomy and was seen today by Dr. Katrina who sent her to the emergency room for potential need for surgical intervention in a.m. the patient denied any other paresthesias or  focal muscle weakness.  No urinary or stool incontinence or pelvic tingling or numbness.  No fever or chills.  No chest pain or palpitations.  No nausea or vomiting or abdominal pain.  No dysuria, oliguria or hematuria or flank pain.   ED Course: When she came to the ER, BP was 174/61 with otherwise normal vital signs.  Labs revealed hypokalemia of 2.9 with blood glucose of 155 and calcium 8.7.  CBC showed leukocytosis 12.7 with neutrophilia. EKG as reviewed by me : None. Imaging: Lumbar spine MRI showed the following: 1. Postoperative changes from prior right hemi laminectomy with micro discectomy at L4-5. Previously seen postoperative collection at the laminectomy defect has resolved in the interim. Residual mild to moderate narrowing of the lateral recesses at this level, improved as compared to prior MRI. 2. Degenerative reactive endplate change about the L4-5 interspace, extending to involve the bilateral L4-5 facets. Finding could contribute to back pain. 3. Mild disc bulging with facet hypertrophy at L3-4 with resultant mild narrowing of the lateral recesses.   Thoracic spine MRI showed the following: 1. Small disc protrusions at T5-6 and T11-12 as above without significant stenosis or overt neural impingement. 2. Mild dextroscoliosis with intermittent mild facet degeneration within the thoracic spine. Finding could contribute to back pain. 3. No other acute abnormality.   The patient was given 40 mcg p.o. potassium chloride  and I added another dose and 1 p.o. Norco.  She will be admitted to an observation medical-surgical bed for further evaluation and  management.     Assessment and Plan:   # Numbness and tingling of left lower extremity This is likely radiculopathy due to lumbar disc disease. On 1/15 resumed Eliquis , no surgical intervention as per neurosurgery - Neurosurgery consulted, recommended neurology evaluation and EMG as an outpatient.  MRI lumbar spine looks better,  no worsening of disc herniation so may not benefit from surgical intervention Neurosurgery recommended physical therapy no surgical intervention at this time. PT and OT eval recommended SNF placement versus acute rehab but patient declined. Recommended left knee brace and right foot AFO as an outpatient.  # Hypokalemia: Potassium repleted.  Resolved # Hypomagnesemia, mag repleted.  Resolved  # Type 2 diabetes mellitus with peripheral neuropathy: s/p basal and NovoLog  and continue Neurontin . Resumed Farxiga, and Tresiba home dose.  Monitor CBG, continue diabetic diet and follow with PCP.   # Chronic heart failure with preserved ejection fraction (HFpEF): continued Farxiga and Aldactone .   # GERD without esophagitis: continued PPI therapy. # Anxiety: continued Ativan . # Essential hypertension: Continued Benicar 40 mg p.o. twice daily and Aldactone .  Monitor BP and follow with PCP. # Depression: continued Wellbutrin  XL and Celexa . # Parkinson's disease: Continue Sinemet  IR. # H/o DVT on Eliquis : Last dose Eliquis  on 1/13 AM On 1/15 resume Eliquis  5 mg p.o. twice daily, no surgical intervention # Left knee osteoarthritis: X-ray negative for any acute finding Continue PT and OT and pain control   # Morbid obesity  Body mass index is 41.17 kg/m.  Nutrition Interventions:   - Patient was instructed, not to drive, operate heavy machinery, perform activities at heights, swimming or participation in water  activities or provide baby sitting services while on Pain, Sleep and Anxiety Medications; until her outpatient Physician has advised to do so again.  - Also recommended to not to take more than prescribed Pain, Sleep and Anxiety Medications.  Patient was seen by physical therapy, who recommended Therapy, SNF placement but patient refused to home health was arranged by TOC. On the day of the discharge the patient's vitals were stable, and no other acute medical condition were reported by  patient. the patient was felt safe to be discharge at Home with Home health.  Consultants: Neurosurgery, neurology Procedures: None  Discharge Exam: General: Appear in no distress, Oral Mucosa Clear, moist. Cardiovascular: S1 and S2 Present, no Murmur, Respiratory: normal respiratory effort, Bilateral Air entry present and no Crackles, no wheezes Abdomen: Bowel Sound present, Soft and no tenderness. Extremities: no Pedal edema, no calf tenderness Neurology: alert and oriented to time, place, and person affect appropriate.  Filed Weights   07/10/24 1545 07/11/24 0203  Weight: 108.8 kg 108.8 kg   Vitals:   07/13/24 0722 07/13/24 0751  BP: (!) 148/56 (!) 154/60  Pulse: 93 94  Resp:    Temp:  98.8 F (37.1 C)  SpO2:  94%    DISCHARGE MEDICATION: Allergies as of 07/13/2024       Reactions   Cat Dander Other (See Comments), Itching   Congestion, watery eyes    Oxycodone Other (See Comments)   Withdrawal symptoms after stopping it from last knee surgery   Penicillin G Itching   Statins Other (See Comments)   Zosyn  [piperacillin  Sod-tazobactam So] Swelling   Face got red and hot         Medication List     STOP taking these medications    torsemide 20 MG tablet Commonly known as: DEMADEX       TAKE these  medications    carbidopa -levodopa  25-100 MG tablet Commonly known as: SINEMET  IR Take 2 tablets by mouth in the morning, at noon, and at bedtime. The timing of this medication is very important.   apixaban  5 MG Tabs tablet Commonly known as: ELIQUIS  Take 1 tablet (5 mg total) by mouth 2 (two) times daily.   buPROPion  150 MG 24 hr tablet Commonly known as: WELLBUTRIN  XL Take 150 mg by mouth in the morning.   citalopram  20 MG tablet Commonly known as: CELEXA  Take 20 mg by mouth at bedtime.   dapagliflozin propanediol 10 MG Tabs tablet Commonly known as: FARXIGA Take 10 mg by mouth daily.   gabapentin  300 MG capsule Commonly known as:  NEURONTIN  300mg  po q morning, 600mg  po at lunch, 600mg  q hs.   insulin  degludec 100 UNIT/ML FlexTouch Pen Commonly known as: TRESIBA Inject 10 Units into the skin at bedtime.   LORazepam  0.5 MG tablet Commonly known as: Ativan  Take 1 tablet (0.5 mg total) by mouth every 8 (eight) hours as needed for anxiety (nausea).   metoCLOPramide  10 MG tablet Commonly known as: REGLAN  Take 1 tablet (10 mg total) by mouth every 8 (eight) hours as needed for nausea or vomiting.   olmesartan 40 MG tablet Commonly known as: BENICAR Take 40 mg by mouth 2 (two) times daily.   ondansetron  8 MG disintegrating tablet Commonly known as: ZOFRAN -ODT Take 1 tablet (8 mg total) by mouth every 8 (eight) hours as needed for nausea or vomiting.   pantoprazole  40 MG tablet Commonly known as: PROTONIX  Take 40 mg by mouth at bedtime.   polyethylene glycol powder 17 GM/SCOOP powder Commonly known as: Smooth LAX Take 17 g by mouth daily as needed. Dissolve 1 capful (17g) in 4-8 ounces of liquid and take by mouth daily.   potassium chloride  SA 20 MEQ tablet Commonly known as: KLOR-CON  M Take 20 mEq by mouth 2 (two) times daily.   senna 8.6 MG Tabs tablet Commonly known as: SENOKOT Take 1 tablet (8.6 mg total) by mouth 2 (two) times daily as needed.   spironolactone  25 MG tablet Commonly known as: ALDACTONE  Take 25 mg by mouth in the morning.   Vitamin D  (Ergocalciferol ) 1.25 MG (50000 UNIT) Caps capsule Commonly known as: DRISDOL  Take 50,000 Units by mouth once a week.   zolpidem  5 MG tablet Commonly known as: AMBIEN  Take 1 tablet (5 mg total) by mouth at bedtime as needed for sleep.       Allergies[1] Discharge Instructions     Ambulatory referral to Neurology   Complete by: As directed    An appointment is requested in approximately: 1 week for EMG as an outpatient   Call MD for:   Complete by: As directed    Weakness or numbness   Call MD for:  extreme fatigue   Complete by: As  directed    Call MD for:  persistant dizziness or light-headedness   Complete by: As directed    Call MD for:  severe uncontrolled pain   Complete by: As directed    Discharge instructions   Complete by: As directed    F/u with PCP in 1 wk F/u with Neurology in 1 wk for EMG out patient F/u with Neurosurgery in 1 wk   Increase activity slowly   Complete by: As directed        The results of significant diagnostics from this hospitalization (including imaging, microbiology, ancillary and laboratory) are listed below for reference.  Significant Diagnostic Studies: DG Knee 1-2 Views Left Result Date: 07/11/2024 CLINICAL DATA:  Ongoing left knee pain, worse with movement. Knee replacement in 2023. EXAM: DG KNEE 1-2V*L* COMPARISON:  Radiograph 01/04/2022 FINDINGS: Left knee arthroplasty in expected alignment. No periprosthetic lucency. No acute or periprosthetic fracture. Prior patellar resurfacing. No joint effusion. No erosive change or focal bone abnormality. Unremarkable soft tissues. IMPRESSION: Left knee arthroplasty without complication or acute abnormality. Electronically Signed   By: Andrea Gasman M.D.   On: 07/11/2024 13:37   MR LUMBAR SPINE WO CONTRAST Result Date: 07/10/2024 CLINICAL DATA:  Initial evaluation for acute back pain. EXAM: MRI LUMBAR SPINE WITHOUT CONTRAST TECHNIQUE: Multiplanar, multisequence MR imaging of the lumbar spine was performed. No intravenous contrast was administered. COMPARISON:  Prior MRI from 05/23/2024. FINDINGS: Segmentation: Standard. Lowest well-formed disc space labeled the L5-S1 level. Alignment: Straightening of the normal lumbar lordosis. No significant listhesis. Vertebrae: Vertebral body height maintained without acute or chronic fracture. Bone marrow signal intensity within normal limits. No worrisome osseous lesions. Degenerative reactive endplate change with marrow edema present about the L4-5 interspace, worse on the right, mildly  progressed from prior. Reactive marrow edema present about the L4-5 facets bilaterally due to facet arthritis. Conus medullaris and cauda equina: Conus extends to the T12-L1 level. Conus and cauda equina appear normal. Paraspinal and other soft tissues: Sequelae of prior right hemi laminectomy with micro discectomy at L4-5. Normal expected postoperative changes within the posterior paraspinous soft tissues. Previously seen collection at the laminectomy defect is largely resolved. Few small T2 hyperintense cyst noted about the kidneys, benign in appearance, no follow-up imaging recommended. Disc levels: L1-2: Mild endplate spurring without significant disc bulge. No stenosis. L2-3: Degenerative disc space narrowing with disc desiccation diffuse disc bulge. Reactive endplate spurring, greater on the right. No spinal stenosis. Foramina remain patent. L3-4: Disc desiccation with mild disc bulge. Endplate spurring. Mild bilateral facet hypertrophy. Resultant mild narrowing of the lateral recesses, slightly greater on the right. Central canal remains patent. No significant foraminal stenosis. L4-5: Degenerative vertebral disc space narrowing with disc desiccation diffuse disc bulge. Disc bulging asymmetric to the right, with right greater than left reactive endplate spurring. Postoperative changes from prior right hemi laminectomy with micro discectomy. Probable postoperative granulation tissue within the right lateral epidural space. Evaluation for residual recurrent disc protrusion limited on this noncontrast examination. Mild to moderate bilateral facet arthrosis. Residual mild to moderate narrowing of the lateral recesses at this level. Central canal remains patent. Stenosis is at this level is improved as compared to prior MRI. Mild right L4 foraminal stenosis. Left neural foramina remains patent. L5-S1: Disc desiccation with mild disc bulge. Reactive endplate spurring. Mild bilateral facet hypertrophy. No spinal  stenosis. Mild left L5 foraminal narrowing. Right neural foramina remains patent. IMPRESSION: 1. Postoperative changes from prior right hemi laminectomy with micro discectomy at L4-5. Previously seen postoperative collection at the laminectomy defect has resolved in the interim. Residual mild to moderate narrowing of the lateral recesses at this level, improved as compared to prior MRI. 2. Degenerative reactive endplate change about the L4-5 interspace, extending to involve the bilateral L4-5 facets. Finding could contribute to back pain. 3. Mild disc bulging with facet hypertrophy at L3-4 with resultant mild narrowing of the lateral recesses. Electronically Signed   By: Morene Hoard M.D.   On: 07/10/2024 23:47   MR THORACIC SPINE WO CONTRAST Result Date: 07/10/2024 CLINICAL DATA:  Initial evaluation for acute mid back pain. EXAM: MRI THORACIC SPINE WITHOUT  CONTRAST TECHNIQUE: Multiplanar, multisequence MR imaging of the thoracic spine was performed. No intravenous contrast was administered. COMPARISON:  Prior MRI from 05/23/2024. FINDINGS: Alignment: Mild dextroscoliosis. Alignment otherwise normal with preservation of the normal cervical lordosis. No listhesis. Vertebrae: Vertebral body height maintained without acute or chronic fracture. Bone marrow signal intensity within normal limits. Few small benign hemangiomata noted. No worrisome osseous lesions. No abnormal marrow edema. Cord:  Normal signal and morphology. Paraspinal and other soft tissues: Paraspinous soft tissues within normal limits. Small T2 hyperintense right renal cysts noted, benign in appearance, no follow-up imaging recommended. Disc levels: Postoperative changes noted within the cervical spine on counter sequence. T5-6: Small right paracentral disc protrusion minimally flattens the right ventral thecal sac without stenosis or impingement. T11-12: Small left paracentral disc protrusion with endplate spurring mildly indents the ventral  thecal sac. No spinal stenosis. Foramina remain patent. Otherwise, no other significant disc pathology seen elsewhere within the thoracic spine for age. No other significant disc bulge or focal disc herniation. Mild intermittent facet degeneration noted. Mild diffuse prominence of the dorsal epidural fat. No significant spinal stenosis. Foramina remain patent. Overall, appearance of the thoracic spine is not significantly changed from prior. IMPRESSION: 1. Small disc protrusions at T5-6 and T11-12 as above without significant stenosis or overt neural impingement. 2. Mild dextroscoliosis with intermittent mild facet degeneration within the thoracic spine. Finding could contribute to back pain. 3. No other acute abnormality. Electronically Signed   By: Morene Hoard M.D.   On: 07/10/2024 23:37   CT ABDOMEN PELVIS W CONTRAST Result Date: 06/26/2024 EXAM: CT ABDOMEN AND PELVIS WITH CONTRAST 06/26/2024 02:21:36 AM TECHNIQUE: CT of the abdomen and pelvis was performed with the administration of 100 mL of iohexol  (OMNIPAQUE ) 350 MG/ML injection. Multiplanar reformatted images are provided for review. Automated exposure control, iterative reconstruction, and/or weight-based adjustment of the mA/kV was utilized to reduce the radiation dose to as low as reasonably achievable. COMPARISON: 06/26/2022. CLINICAL HISTORY: Abdominal pain, vomiting, diarrhea. FINDINGS: LOWER CHEST: Mild right basilar parenchymal scarring. LIVER: Moderate hepatic steatosis. No intrahepatic mass. No intrahepatic biliary ductal dilation. A cyst is present within the left hepatic lobe. Small focus of portal venous shunting within the inferior right hepatic lobe (series 39, image 2). GALLBLADDER AND BILE DUCTS: Gallbladder is unremarkable. No biliary ductal dilatation. SPLEEN: No acute abnormality. PANCREAS: No acute abnormality. ADRENAL GLANDS: No acute abnormality. KIDNEYS, URETERS AND BLADDER: A simple cortical cyst is seen within the upper  pole of the right kidney, better assessed on renal sonogram of 12/12/2020. Follow-up imaging is recommended. Per consensus, no follow-up is needed for simple Bosniak type 1 and 2 renal cysts, unless the patient has a malignancy history or risk factors. The kidneys are otherwise unremarkable. No stones in the kidneys or ureters. No hydronephrosis. No perinephric or periureteral stranding. Urinary bladder is unremarkable. GI AND BOWEL: Stomach demonstrates no acute abnormality. 2 polypoid intraluminal masses are seen within the transverse colon, measuring 1.9 x 6.0 cm (and within the descending colon, measuring roughly 9.0 x 2.6 cm, compatible with pedunculated intraluminal lipomas. . This appears unchanged from prior examination. No evidence of obstruction. Mild sigmoid diverticulosis without superimposed acute inflammatory change. The small bowel is unremarkable. Appendix normal. PERITONEUM AND RETROPERITONEUM: No ascites. No free air. VASCULATURE: Aorta is normal in caliber. Moderate aortoiliac atherosclerotic calcification. LYMPH NODES: No lymphadenopathy. REPRODUCTIVE ORGANS: No acute abnormality. BONES AND SOFT TISSUES: Osseous structures are age appropriate. No acute bone abnormality. No lytic or blastic bone lesion. No  focal soft tissue abnormality. IMPRESSION: 1. No acute findings in the abdomen or pelvis. 2. Intraluminal colonic lipomas in the transverse and descending colon, unchanged from prior examination, without obstruction. 3. Mild sigmoid diverticulosis without superimposed acute inflammatory change. 4. Moderate hepatic steatosis 5. Raf score includes aortic atherosclerosis ([id number]). Electronically signed by: Dorethia Molt MD 06/26/2024 03:53 AM EST RP Workstation: [id number]   DG Chest 2 View Result Date: 06/25/2024 CLINICAL DATA:  Chest pain. EXAM: CHEST - 2 VIEW COMPARISON:  05/23/2024.  CT 09/06/2016 FINDINGS: Stable heart size and mediastinal contours. Prominent left epicardial fat pad.  No acute airspace disease, pulmonary edema, pleural effusion or pneumothorax. Thoracic spondylosis. IMPRESSION: No acute chest findings. Electronically Signed   By: Andrea Gasman M.D.   On: 06/25/2024 17:51    Microbiology: No results found for this or any previous visit (from the past 240 hours).   Labs: CBC: Recent Labs  Lab 07/10/24 2048 07/11/24 0427 07/12/24 0459  WBC 12.7* 11.9* 9.8  NEUTROABS 8.0*  --   --   HGB 12.4 12.2 11.6*  HCT 40.1 39.6 38.1  MCV 87.4 87.8 88.6  PLT 267 265 246   Basic Metabolic Panel: Recent Labs  Lab 07/10/24 2048 07/11/24 0427 07/12/24 0459  NA 140 141 140  K 2.9* 3.1* 3.6  CL 102 103 103  CO2 27 28 27   GLUCOSE 155* 113* 86  BUN 9 8 5*  CREATININE 0.61 0.58 0.54  CALCIUM 8.7* 8.3* 8.6*  MG  --  1.6* 2.1  PHOS  --  3.5 2.9   Liver Function Tests: No results for input(s): AST, ALT, ALKPHOS, BILITOT, PROT, ALBUMIN in the last 168 hours. No results for input(s): LIPASE, AMYLASE in the last 168 hours. No results for input(s): AMMONIA in the last 168 hours. Cardiac Enzymes: No results for input(s): CKTOTAL, CKMB, CKMBINDEX, TROPONINI in the last 168 hours. BNP (last 3 results) No results for input(s): BNP in the last 8760 hours. CBG: Recent Labs  Lab 07/10/24 2356 07/11/24 0747 07/13/24 0753  GLUCAP 129* 157* 112*    Time spent: 35 minutes  Signed:  Elvan Sor  [facility] 07/13/2024 11:47 AM      [1]  Allergies Allergen Reactions   Cat Dander Other (See Comments) and Itching    Congestion, watery eyes    Oxycodone Other (See Comments)    Withdrawal symptoms after stopping it from last knee surgery   Penicillin G Itching   Statins Other (See Comments)   Zosyn  [Piperacillin  Sod-Tazobactam So] Swelling    Face got red and hot

## 2024-07-13 NOTE — Plan of Care (Signed)

## 2024-07-13 NOTE — Progress Notes (Addendum)
 The patient has  significant mobility limitation that impairs their ability to safely perform mobility-related activities of daily living (MRADLs), including ambulating functional distances required for toileting, grooming, bathing, and attending medical appointments. The patient is unable to safely ambulate independently due to pain, weakness, impaired balance, and limited endurance, placing them at increased risk for falls and further injury. The patient has demonstrated an inability to ambulate functional distances without stopping, requiring frequent rest breaks and assistance. The mobility limitation cannot be sufficiently resolved with a cane or walker alone. A wheelchair is medically necessary at this time to improve safe mobility, reduce fall risk, allow completion of MRADLs, and support access to medically required care. The patient has the physical and cognitive ability to safely use the wheelchair, and/or has caregiver support available to assist with mobility as needed.

## 2024-07-13 NOTE — Progress Notes (Signed)
" ° °  Inpatient Rehabilitation Admissions Coordinator   Patient lacks the medical neccesity for CIR rehab. Recommend to pursue other rehab venues.  Heron Leavell, RN, MSN Rehab Admissions Coordinator [phone] 07/13/2024 11:06 AM  "

## 2024-07-13 NOTE — Progress Notes (Signed)
 Physical Therapy Treatment Patient Details Name: [name] MRN: [id number] DOB: June 15, 1956 Today's Date: 07/13/2024   History of Present Illness Pt is a 69 y/o F admitted on 07/10/24 after presenting with c/o acute onset of LLE numbness & inability to bear weight. PT with recent L4-5 discectomy on 04/25/24. Symptoms are consistent with L5 motor radiculopathy. PMH: anxiety, depression, asthma, benign essential tremor, CAD, fibromyalgia, HTN, dyslipidemia, hypothyroidism, migraine, Parkinson's disease, GERD, DM2    PT Comments  Pt seen for PT tx with pt pleasant, agreeable to tx. Educated pt on use of AFO & discussed possibility of getting R AFO with pt eager to f/u with outpatient orthotist (pt also does not have shoes here to trial AFO). Educated pt on use of w/c vs RW to reduce fall risk until pt receives AFO with pt agreeable. Pt appears to have less L knee pain compared to last PT session. Pt is able to ambulate into hallway with RW & CGA with compensatory gait pattern. Recommend ongoing PT services to progress mobility as able.    If plan is discharge home, recommend the following: Assist for transportation;Assistance with cooking/housework;Direct supervision/assist for financial management;Help with stairs or ramp for entrance;A little help with walking and/or transfers;A little help with bathing/dressing/bathroom   Can travel by private Theme Park Manager cushion (measurements PT);Wheelchair (measurements PT) (f/u outpatient for R AFO)    Recommendations for Other Services       Precautions / Restrictions Precautions Precautions: Fall Precaution/Restrictions Comments: R foot drop Restrictions Weight Bearing Restrictions Per Provider Order: No     Mobility  Bed Mobility               General bed mobility comments: not tested, pt received & left sitting in recliner    Transfers Overall transfer level: Needs assistance Equipment used:  Rolling walker (2 wheels) Transfers: Sit to/from Stand Sit to Stand: Contact guard assist                Ambulation/Gait Ambulation/Gait assistance: Contact guard assist Gait Distance (Feet): 45 Feet Assistive device: Rolling walker (2 wheels) Gait Pattern/deviations: Decreased step length - left, Decreased step length - right, Decreased stride length, Decreased dorsiflexion - right Gait velocity: decreased     General Gait Details: increased R hip/knee flexion during swing phase to compensate for absent R dorsiflexion   Stairs             Wheelchair Mobility     Tilt Bed    Modified Rankin (Stroke Patients Only)       Balance Overall balance assessment: Needs assistance Sitting-balance support: Feet supported Sitting balance-Leahy Scale: Good     Standing balance support: During functional activity, Bilateral upper extremity supported, Reliant on assistive device for balance Standing balance-Leahy Scale: Fair                              Hotel Manager: No apparent difficulties  Cognition Arousal: Alert Behavior During Therapy: WFL for tasks assessed/performed   PT - Cognitive impairments: No apparent impairments                       PT - Cognition Comments: appears to have good understanding of education, appreciative of PT education/efforts Following commands: Intact      Cueing Cueing Techniques: Verbal cues  Exercises Other Exercises Other Exercises: Spent significant time  educating pt on AFO with pt ultimately requesting to f/u with [facility] - asked neurosurgery MD for perscription faxed to [facility] for her. Educated pt on use of w/c for primary method of mobility to reduce fall risk 2/2 R drop foot until pt receives AFO & pt agreeable.    General Comments        Pertinent Vitals/Pain Pain Assessment Pain Assessment: Faces Faces Pain Scale: Hurts even more Pain Location: L knee Pain  Descriptors / Indicators: Discomfort, Aching, Grimacing, Guarding Pain Intervention(s): Monitored during session, Limited activity within patient's tolerance    Home Living                          Prior Function            PT Goals (current goals can now be found in the care plan section) Acute Rehab PT Goals Patient Stated Goal: decreased pain, go home PT Goal Formulation: With patient Time For Goal Achievement: 07/25/24 Potential to Achieve Goals: Good Progress towards PT goals: Progressing toward goals    Frequency    Min 3X/week      PT Plan      Co-evaluation              AM-PAC PT 6 Clicks Mobility   Outcome Measure  Help needed turning from your back to your side while in a flat bed without using bedrails?: None Help needed moving from lying on your back to sitting on the side of a flat bed without using bedrails?: A Lot Help needed moving to and from a bed to a chair (including a wheelchair)?: A Little Help needed standing up from a chair using your arms (e.g., wheelchair or bedside chair)?: A Little Help needed to walk in hospital room?: A Little Help needed climbing 3-5 steps with a railing? : A Lot 6 Click Score: 17    End of Session   Activity Tolerance: Patient tolerated treatment well Patient left: in chair;with chair alarm set;with call bell/phone within reach Nurse Communication: Mobility status PT Visit Diagnosis: Pain;Muscle weakness (generalized) (M62.81);Difficulty in walking, not elsewhere classified (R26.2);Other abnormalities of gait and mobility (R26.89);Other symptoms and signs involving the nervous system (R29.898) Pain - Right/Left: Left Pain - part of body: Knee     Time: 8963-8888 PT Time Calculation (min) (ACUTE ONLY): 35 min  Charges:    $Therapeutic Activity: 23-37 mins PT General Charges $$ ACUTE PT VISIT: 1 Visit                     Richerd Pinal, PT, DPT 07/13/24, 1:23 PM   [name] 07/13/2024, 1:21 PM

## 2024-07-13 NOTE — Telephone Encounter (Signed)
-----   Message from HILMA HASTINGS M sent at 07/13/2024 12:45 PM EST ----- Please forward the medication interactions from home health to her PCP as we don't prescribe most of these medications. Please document this was done.   Thanks! ----- Message ----- From: Girard Don GAILS, CMA Sent: 07/13/2024  12:38 PM EST To: Hastings Hilma, PA-C; Reeves Daisy, MD

## 2024-07-13 NOTE — Telephone Encounter (Signed)
 Received drug interaction information for the patient. Per Glade as stated below needs to go to PCP Dr [facility] information faxed to their office at [phone]

## 2024-07-16 ENCOUNTER — Telehealth: Payer: Self-pay | Admitting: Orthopedic Surgery

## 2024-07-16 ENCOUNTER — Telehealth: Payer: Self-pay | Admitting: Neurosurgery

## 2024-07-16 DIAGNOSIS — R209 Unspecified disturbances of skin sensation: Secondary | ICD-10-CM

## 2024-07-16 DIAGNOSIS — M5126 Other intervertebral disc displacement, lumbar region: Secondary | ICD-10-CM

## 2024-07-16 DIAGNOSIS — M21371 Foot drop, right foot: Secondary | ICD-10-CM

## 2024-07-16 DIAGNOSIS — M5416 Radiculopathy, lumbar region: Secondary | ICD-10-CM

## 2024-07-16 DIAGNOSIS — Z9889 Other specified postprocedural states: Secondary | ICD-10-CM

## 2024-07-16 MED ORDER — GABAPENTIN 300 MG PO CAPS: ORAL_CAPSULE | ORAL | 0 refills | Status: AC

## 2024-07-16 NOTE — Telephone Encounter (Signed)
 Pt called in to sch a f/u and has scheduled with Dr.Shah as well. Per the note from Dr.Y on 01/15 it says I have discussed her case with Dr. Michiel as well as the patient at length. We reviewed her imaging, which is improved. At this point, do not feel that the exploration is indicated. Will focus on therapy.  Do we still need to get her scheduled as per her AVS?

## 2024-07-16 NOTE — Addendum Note (Signed)
 Addended byBETHA HILMA HASTINGS on: 07/16/2024 03:48 PM   Modules accepted: Orders

## 2024-07-16 NOTE — Telephone Encounter (Signed)
 Patient states she has been taking neurotin 300mg  in am, 600mg  at lunch, and 600mg  at night. And has not skipped any that she recalls. She did get this medication during her recent Hospital visit also. She does need a RX she said

## 2024-07-16 NOTE — Telephone Encounter (Signed)
 I see that she has an appointment scheduled with Neurology on 2/5 for a 3 month follow up. I do not see where an EMG is scheduled yet.   When would you like to see her again? After she gets EMG results?

## 2024-07-16 NOTE — Telephone Encounter (Signed)
 Refill of neurontin  sent to pharmacy. Please let her know.

## 2024-07-16 NOTE — Telephone Encounter (Signed)
 Refill request from pharmacy for neurontin . Please verify with patient that she is taking neurotin 300mg  in am, 600mg  at lunch, and 600mg  at night.   Looks like this was last villed on 05/08/24 and she was only given 30 day supply. Has she been taking it regularly.   Will send back denied for now.

## 2024-07-17 ENCOUNTER — Encounter: Admitting: Orthopedic Surgery

## 2024-07-17 ENCOUNTER — Telehealth: Payer: Self-pay | Admitting: Neurosurgery

## 2024-07-17 NOTE — Telephone Encounter (Signed)
 She called asking to schedule a follow up appointment with us . It sounds like you would rather wait until we have the EMG results before determining if she needs a follow up with our department?  An order has been faxed to [facility] for an AFO.

## 2024-07-17 NOTE — Telephone Encounter (Signed)
 [facility] form has been faxed.

## 2024-07-17 NOTE — Telephone Encounter (Signed)
 Patient called to let our office know that [facility] does not have the prescription for her brace. Can this be re-sent?

## 2024-07-17 NOTE — Telephone Encounter (Signed)
 Left detailed message on voicemail advising patient ok per DPR on file

## 2024-07-18 NOTE — Telephone Encounter (Signed)
 Faxed.

## 2024-07-18 NOTE — Telephone Encounter (Addendum)
 I called [facility] to confirm they have the referral and the patient is scheduled with their [place] office on 07/25/24 at 9am.

## 2024-07-18 NOTE — Telephone Encounter (Signed)
 EMG referral placed by Dr Von was for [facility]. Patient wants to go to [facility] (she is already an established patient there). New referral placed for bilateral EMG to [facility], sent to Dr Clois for [place].

## 2024-07-18 NOTE — Telephone Encounter (Signed)
 Called patient to make aware of her appointment with the [facility] clinic in [place] on 1/28 at 9am. Additionally informed her that we have sent new referral in to [facility] for her EMG.

## 2024-07-18 NOTE — Telephone Encounter (Signed)
 Called patient to discuss follow up appointment. Informed patient that we can schedule a follow up appointment with Dr. Clois once she has an appointment for an EMG. Dr. Clois would like to have EMG results prior to seeing her back.   Patient states she can not make an appointment for EMG yet as the referral was placed to the incorrect Neurology clinic. Patient already sees Dr. Maree. New referral to be placed.

## 2024-07-20 ENCOUNTER — Telehealth: Payer: Self-pay | Admitting: Neurosurgery

## 2024-07-20 NOTE — Telephone Encounter (Signed)
 Per discussion with Glade Boys, PA-C: Okay for nursing. Call pcp with other issues.  I notified Aureliano of the above. Apparently, they had us  listed as the PCP and he is going to get that changed.   Per Glade, If she has any other symptoms (cp, sob, blurry vision) with that bp then she should go to [facility]  Attempted to call Ms Telleria to discuss. Left detailed message on he personal voicemail regarding the importance of taking medications and seeking care if having CP, SOB, blurry vision.   I called Aureliano again to notify him that I tried to reach Ms Rumer to discuss that she should be taking her meds and if she is symptomatic she should go to the ER. Aureliano confirmed she was asymptomatic when he was with her. He also said she hasn't used her dexcom in over a week and hadn't been checking her blood pressure. He is going to work on getting nursing out to see her.

## 2024-07-20 NOTE — Telephone Encounter (Signed)
 Aureliano Minder occupational therapist with [facility] is calling in to inform us  that the Patient is currently not taking her prescribed medications. Her blood pressure is elevated at 160/100, and she has not been using her Dexcom device. A nursing visit is needed to provide education on medication adherence and overall care plan management. They are wanting a callback to see if this is something that can be added.
